# Patient Record
Sex: Male | Born: 1950
Health system: Southern US, Community
[De-identification: ages and names within clinical notes are randomized; demographics above are authoritative.]

## PROBLEM LIST (undated history)

## (undated) DIAGNOSIS — J189 Pneumonia, unspecified organism: Secondary | ICD-10-CM

## (undated) DIAGNOSIS — M1611 Unilateral primary osteoarthritis, right hip: Secondary | ICD-10-CM

## (undated) DIAGNOSIS — Z8639 Personal history of other endocrine, nutritional and metabolic disease: Secondary | ICD-10-CM

## (undated) DIAGNOSIS — M1711 Unilateral primary osteoarthritis, right knee: Secondary | ICD-10-CM

## (undated) DIAGNOSIS — R112 Nausea with vomiting, unspecified: Secondary | ICD-10-CM

## (undated) DIAGNOSIS — I499 Cardiac arrhythmia, unspecified: Secondary | ICD-10-CM

## (undated) DIAGNOSIS — Z951 Presence of aortocoronary bypass graft: Secondary | ICD-10-CM

## (undated) DIAGNOSIS — Z973 Presence of spectacles and contact lenses: Secondary | ICD-10-CM

## (undated) DIAGNOSIS — E785 Hyperlipidemia, unspecified: Secondary | ICD-10-CM

## (undated) DIAGNOSIS — Z9889 Other specified postprocedural states: Secondary | ICD-10-CM

## (undated) DIAGNOSIS — G2581 Restless legs syndrome: Secondary | ICD-10-CM

## (undated) DIAGNOSIS — I255 Ischemic cardiomyopathy: Secondary | ICD-10-CM

## (undated) DIAGNOSIS — Z9289 Personal history of other medical treatment: Secondary | ICD-10-CM

## (undated) DIAGNOSIS — I219 Acute myocardial infarction, unspecified: Secondary | ICD-10-CM

## (undated) DIAGNOSIS — I2510 Atherosclerotic heart disease of native coronary artery without angina pectoris: Secondary | ICD-10-CM

## (undated) DIAGNOSIS — T84020A Dislocation of internal right hip prosthesis, initial encounter: Secondary | ICD-10-CM

## (undated) DIAGNOSIS — U071 COVID-19: Secondary | ICD-10-CM

## (undated) DIAGNOSIS — M00061 Staphylococcal arthritis, right knee: Secondary | ICD-10-CM

## (undated) DIAGNOSIS — K219 Gastro-esophageal reflux disease without esophagitis: Secondary | ICD-10-CM

## (undated) HISTORY — DX: Personal history of other medical treatment: Z92.89

## (undated) HISTORY — PX: ESOPHAGOGASTRODUODENOSCOPY: SHX1529

## (undated) HISTORY — DX: Presence of aortocoronary bypass graft: Z95.1

## (undated) HISTORY — DX: Hyperlipidemia, unspecified: E78.5

## (undated) HISTORY — PX: EYE SURGERY: SHX253

## (undated) HISTORY — PX: CHOLECYSTECTOMY: SHX55

## (undated) HISTORY — DX: Ischemic cardiomyopathy: I25.5

## (undated) HISTORY — PX: COLONOSCOPY: SHX174

## (undated) HISTORY — PX: KNEE SURGERY: SHX244

## (undated) HISTORY — PX: CERVICAL SPINE SURGERY: SHX589

---
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
## 1998-08-09 ENCOUNTER — Ambulatory Visit (HOSPITAL_COMMUNITY): Admission: RE | Admit: 1998-08-09 | Discharge: 1998-08-09 | Payer: Self-pay | Admitting: Neurosurgery

## 1998-08-09 ENCOUNTER — Encounter: Payer: Self-pay | Admitting: Neurosurgery

## 1998-08-28 ENCOUNTER — Encounter: Payer: Self-pay | Admitting: Neurosurgery

## 1998-08-28 ENCOUNTER — Ambulatory Visit (HOSPITAL_COMMUNITY): Admission: RE | Admit: 1998-08-28 | Discharge: 1998-08-28 | Payer: Self-pay | Admitting: Neurosurgery

## 1998-09-18 ENCOUNTER — Ambulatory Visit (HOSPITAL_COMMUNITY): Admission: RE | Admit: 1998-09-18 | Discharge: 1998-09-18 | Payer: Self-pay | Admitting: Neurosurgery

## 1998-12-08 DIAGNOSIS — Z951 Presence of aortocoronary bypass graft: Secondary | ICD-10-CM

## 1998-12-08 HISTORY — DX: Presence of aortocoronary bypass graft: Z95.1

## 1999-05-30 ENCOUNTER — Ambulatory Visit (HOSPITAL_COMMUNITY): Admission: RE | Admit: 1999-05-30 | Discharge: 1999-05-30 | Payer: Self-pay | Admitting: Cardiology

## 1999-05-30 ENCOUNTER — Encounter: Payer: Self-pay | Admitting: Thoracic Surgery (Cardiothoracic Vascular Surgery)

## 1999-05-31 ENCOUNTER — Ambulatory Visit (HOSPITAL_COMMUNITY): Admission: RE | Admit: 1999-05-31 | Discharge: 1999-05-31 | Payer: Self-pay | Admitting: *Deleted

## 1999-06-05 ENCOUNTER — Inpatient Hospital Stay (HOSPITAL_COMMUNITY)
Admission: RE | Admit: 1999-06-05 | Discharge: 1999-06-09 | Payer: Self-pay | Admitting: Thoracic Surgery (Cardiothoracic Vascular Surgery)

## 1999-06-05 HISTORY — PX: CORONARY ARTERY BYPASS GRAFT: SHX141

## 1999-06-06 ENCOUNTER — Encounter: Payer: Self-pay | Admitting: Thoracic Surgery (Cardiothoracic Vascular Surgery)

## 1999-06-07 ENCOUNTER — Encounter: Payer: Self-pay | Admitting: Thoracic Surgery (Cardiothoracic Vascular Surgery)

## 2000-06-23 ENCOUNTER — Encounter: Payer: Self-pay | Admitting: Surgery

## 2000-06-24 ENCOUNTER — Encounter (INDEPENDENT_AMBULATORY_CARE_PROVIDER_SITE_OTHER): Payer: Self-pay

## 2000-06-24 ENCOUNTER — Observation Stay (HOSPITAL_COMMUNITY): Admission: RE | Admit: 2000-06-24 | Discharge: 2000-06-24 | Payer: Self-pay | Admitting: Surgery

## 2000-06-24 ENCOUNTER — Encounter: Payer: Self-pay | Admitting: Surgery

## 2000-06-24 ENCOUNTER — Encounter (INDEPENDENT_AMBULATORY_CARE_PROVIDER_SITE_OTHER): Payer: Self-pay | Admitting: *Deleted

## 2001-04-20 ENCOUNTER — Encounter: Payer: Self-pay | Admitting: Orthopedic Surgery

## 2001-04-21 ENCOUNTER — Encounter: Payer: Self-pay | Admitting: Orthopedic Surgery

## 2001-04-21 ENCOUNTER — Inpatient Hospital Stay (HOSPITAL_COMMUNITY): Admission: RE | Admit: 2001-04-21 | Discharge: 2001-04-22 | Payer: Self-pay | Admitting: Orthopedic Surgery

## 2002-12-08 DIAGNOSIS — M00061 Staphylococcal arthritis, right knee: Secondary | ICD-10-CM

## 2002-12-08 HISTORY — DX: Staphylococcal arthritis, right knee: M00.061

## 2006-04-08 ENCOUNTER — Encounter: Payer: Self-pay | Admitting: Neurosurgery

## 2007-05-28 ENCOUNTER — Encounter: Admission: RE | Admit: 2007-05-28 | Discharge: 2007-05-28 | Payer: Self-pay | Admitting: Cardiology

## 2007-06-01 HISTORY — PX: CARDIAC CATHETERIZATION: SHX172

## 2008-01-16 ENCOUNTER — Encounter: Admission: RE | Admit: 2008-01-16 | Discharge: 2008-01-16 | Payer: Self-pay | Admitting: Orthopedic Surgery

## 2008-01-21 ENCOUNTER — Encounter: Admission: RE | Admit: 2008-01-21 | Discharge: 2008-01-21 | Payer: Self-pay | Admitting: Orthopedic Surgery

## 2008-02-04 ENCOUNTER — Encounter: Admission: RE | Admit: 2008-02-04 | Discharge: 2008-02-04 | Payer: Self-pay | Admitting: Orthopedic Surgery

## 2008-02-06 HISTORY — PX: TRANSTHORACIC ECHOCARDIOGRAM: SHX275

## 2008-02-24 ENCOUNTER — Encounter: Admission: RE | Admit: 2008-02-24 | Discharge: 2008-02-24 | Payer: Self-pay | Admitting: Orthopedic Surgery

## 2008-06-16 ENCOUNTER — Ambulatory Visit (HOSPITAL_COMMUNITY): Admission: RE | Admit: 2008-06-16 | Discharge: 2008-06-17 | Payer: Self-pay | Admitting: Orthopaedic Surgery

## 2008-12-27 ENCOUNTER — Encounter: Payer: Self-pay | Admitting: Gastroenterology

## 2009-01-29 ENCOUNTER — Ambulatory Visit: Payer: Self-pay | Admitting: Gastroenterology

## 2009-02-12 ENCOUNTER — Ambulatory Visit: Payer: Self-pay | Admitting: Gastroenterology

## 2010-01-14 ENCOUNTER — Telehealth: Payer: Self-pay | Admitting: Cardiovascular Disease

## 2010-12-29 ENCOUNTER — Encounter: Payer: Self-pay | Admitting: Orthopedic Surgery

## 2011-01-07 NOTE — Progress Notes (Signed)
Summary: Question for Dr. Mariah Milling  Phone Note Call from Patient Call back at Home Phone [phone]   Caller: Patient Call For: Dr. Mariah Milling Summary of Call: Patient would like for Dr. Mariah Milling to give him a call to discuss and upcoming event that he is planning on participating in if Dr. Mariah Milling thinks that it would be ok.  He is thinking of participating in a wrestling tournament and wants to know if Dr. Mariah Milling thinks this will be ok considering his past heart condition. Initial call taken by: [place],  January 14, 2010 8:07 AM    Called patient and he will think about not wrestling. He hurt himself recently.

## 2011-03-09 DIAGNOSIS — Z9289 Personal history of other medical treatment: Secondary | ICD-10-CM

## 2011-03-09 HISTORY — DX: Personal history of other medical treatment: Z92.89

## 2011-04-22 NOTE — Op Note (Signed)
NAMEMUAAD, BOEHNING                 ACCOUNT NO.:  [id number]   MEDICAL RECORD NO.:  [id number]          PATIENT TYPE:  OIB   LOCATION:  5035                         FACILITY:  [facility]   PHYSICIAN:  Ephriam C. Ophelia Charter, M.D.    DATE OF BIRTH:  1951/03/11   DATE OF PROCEDURE:  06/16/2008  DATE OF DISCHARGE:                               OPERATIVE REPORT   PREOPERATIVE DIAGNOSES:  C5-6, C6-7 spondylosis with herniated nucleus  pulposus.   POSTOPERATIVE DIAGNOSES:  C5-6, C6-7 spondylosis with herniated nucleus  pulposus.   PROCEDURES:  C5-6, C6-7 anterior cervical diskectomy and fusion,  allograft and plate.   SURGEON:  Trung C. Ophelia Charter, MD   ASSISTANT:  Maud Deed, PA-C   ANESTHESIA:  GOT.   DRAINS:  One Hemovac.   ESTIMATED BLOOD LOSS:  100 mL.   PROCEDURE:  After induction of general anesthesia and orotracheal  intubation, the patient had his arms tucked to the sides.  Preoperative  antibiotics were given.  A surgical checklist time-out was performed  after the area was squared with towels, sterile skin marker, and a  prominent skin crease overlying the appropriate level.  Betadine bio  drape, sterile Mayo stand to the head, and thyroid sheets and drapes.  Incision was made starting at the midline extending to the left.  Platysma was divided in the line with the fibers.  Blunt dissection to  the patient's very short thick neck down to the longus coli was  performed.  This was extremely deep hole.  Large Bishop and Cloward  retractors were initially placed with the longus plates proximal and one  could barely hold the longus coli.  It had a tendency to pop up.  We can  see the esophagus.  Shadow line retractor was then placed and then using  combination of shad own line right and left and then Cloward retractor  up and down, this gave visualization.  Diskectomy was performed at C5-6  after needle was placed at 4-5 and confirmed with the lateral C-arm that  was at appropriate level.   Spurs were removed with power bur progressing  back to the posterior cortex.  There was thick chunks of disk and the  ligament which was causing deformity of the cord as well as spurring and  the posterior aspect showed a 1-mm disk space.  Spurs and disk were  removed, so the dura was completely decompressed, was visualized on both  gutters.  There was extruded fragment and there was significant  irritation with the arm jumping on the left side.  No fragments were  present slightly superior into the left.  Spurs were removed out.  Lateral gutters were stripped and sizers with rasp were used and 7-mm  graft was selected, impacted this flush with the bone slightly sticking  out about a millimeter.  The patient had some jumping of the lower  extremities and the graft was not advanced further until the other level  was performed.  Self-retaining retractors were moved down the C6-7  level.  Diskectomy was performed.  There was a 1-mm disk space  that was  tighter than looked on MRI scan and had to be progressed down in the bur  at the maximum setting would barely reach the posterior cortex.  Extra  long 2-mm Kerrison was needed to remove chunks of ligament and disk off  of the dura.  After irrigation with saline solution, stripping the  gutters, a 6-mm graft was selected, allograft cortical cancellus.  Before the C6-7 graft was placed, C5-6 graft was advanced with traction  applied by the CRNA until it was flushed to the anterior cortex.  It was  secured then the lower graft was placed, countersunk 1-2 mm, and was  secured.  A 6-mm plate was selected, initially placed, and on the AP  fluoroscopy was slightly angled, it was repositioned appropriately, so  it was at the midline and then all the 6 holes of screw were filled with  the Biomet view-lock plate and 16-XW screws.  All screws locked in with  the ring.  After irrigation, some FloSeal was squirted.  Fingertip was  placed down in the  anterior to the vertebral body of the mediastinum for  egress of any fluid.  The operative field was dry.  Esophagus was  intact.  Hemovac was placed in-and-out technique in line with skin  incision, 3-0 Vicryl, 4-0 Vicryl was supplied, and tincture of Benzoin,  Steri-Strips, Marcaine infiltration, and a postop dressing, and collar.  The patient tolerated the procedure well, was neurologically intact in  the recovery room.      Inez C. Ophelia Charter, M.D.  Electronically Signed     MCY/MEDQ  D:  06/16/2008  T:  06/16/2008  Job:  [id number]

## 2011-04-25 NOTE — Op Note (Signed)
[facility]  Patient:    Lee Mclaughlin, Lee Mclaughlin                        MRN: [id number] Proc. Date: 04/20/01 Adm. Date:  [id number] Attending:  Twana First                           Operative Report  PREOPERATIVE DIAGNOSIS: Right septic knee infection.  POSTOPERATIVE DIAGNOSIS: Right septic knee infection.  OPERATION/PROCEDURE: Right knee examination under anesthesia followed by arthroscopic lavage and partial subtotal synovectomy.  SURGEON: Elana Alm. Thurston Hole, M.D.  ASSISTANT: Kirstin A. Shepperson, P.A.  ANESTHESIA: General.  OPERATIVE TIME: Forty minutes.  COMPLICATIONS: None.  INDICATIONS FOR PROCEDURE: Mr. Renner is a 60 year old gentleman who had previously undergone a right knee arthroscopy four days ago.  He has had persistently increasing pain over the last 24 hours, with knee aspiration today showing gram-positive cocci with obvious staph infection, and is to undergo arthroscopy with lavage and IV antibiotic treatment.  DESCRIPTION OF PROCEDURE: Mr. Empson was brought to the operating room on Apr 20, 2001 and placed on the operating room table in the supine position.  After an adequate level of general anesthesia was obtained his right knee was examined under anesthesia.  Range of motion was -5 to 120 degrees, with obvious purulent material draining from the lateral portal, with mild erythema around this lateral portal.  The medial and lateral portals were otherwise intact.  No other cellulitis noted.  His right leg was prepped using sterile Betadine and draped using sterile technique.  He received Ancef 1 g IV. Initially through the inferolateral portal the arthroscope with a pump attached was placed and purulent drainage removed.  He had already had cultures obtained so new cultures were not needed.  Through the inferomedial portal an arthroscopic shaver was placed.  He was found to have moderate hemorrhage and old blood in the  knee from his previous arthroscopy.  This was thoroughly debrided.  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  The medial meniscus remnant was intact.  The intercondylar notch was inspected and the anterior and posterior cruciate ligaments were intact, but there was granulation tissue and old blood in and around the anterior notch, which was debrided and purulent material as well. The lateral compartment showed grade 3 chondromalacia, 25-30%, similar to what was found on his previous arthroscopy, and lateral meniscal remnant was found to be intact as well.  The patellofemoral joint showed grade 3 chondromalacia over 50-75% and, again, this was very similar to what was found on his previous arthroscopy, with no new findings noted.  Further debridement was carried out.  Significant synovitis in medial and lateral gutters was thoroughly debrided as well as the suprapatellar pouch.  No loose bodies were noted.  After this had been thoroughly cleansed and debrided and 9000 cc of saline solution and 500 cc of antibiotic solution had been thoroughly irrigated through the knee, and it was felt that all pathology had been satisfactorily addressed, the instruments were removed.  Through the anteromedial and anterolateral portals two medium Hemovac drains were placed. Sterile dressings were then applied and the patient was awakened and taken to the recovery room in stable condition.  Needle  and sponge counts were correct x 2 at the end of the case. DD:  04/20/01 TD:  04/21/01 Job: [id number]

## 2011-04-25 NOTE — Op Note (Signed)
[facility]  Patient:    Lee Mclaughlin, Lee Mclaughlin                        MRN: [id number] Proc. Date: 06/24/00 Adm. Date:  [id number] Disc. Date: [id number] Attending:  Charlton Haws CC:         Lum Babe, M.D.                           Operative Report  ACCOUNT NUMBER:  [id number]  CCS NUMBER:  [id number]  PREOPERATIVE DIAGNOSIS:  Chronic calculus cholecystitis.  POSTOPERATIVE DIAGNOSIS:  Chronic calculus cholecystitis.  OPERATION:  Laparoscopic cholecystectomy with operative cholangiogram.  SURGEON:  Currie Paris, M.D.  ASSISTANT:  Donnie Coffin. Samuella Cota, M.D.  ANESTHESIA:  General endotracheal.  CLINICAL HISTORY:  The patient is a 60 year old who had a prior history of coronary artery disease who presented with some acute abdominal pain, and was found to have gallstones.  His pain resolved and he was scheduled for elective cholecystectomy.  He had a slightly elevated total bilirubin, but the remaining liver functions were normal.  DESCRIPTION OF PROCEDURE:  The patient was brought to the operating room and after satisfactory general endotracheal anesthesia had been obtained, the abdomen was prepped and draped.  A 0.25% Marcaine at each incision.  An umbilical incision was made, and the fascia opened, and the peritoneal cavity entered.  A pursestring was placed and the [place] introduced.  The abdomen was insufflated to 15.  The patient was placed in reversed Trendelenburg, tilted to the left, and three additional cannulas were placed in their usual position.  The gallbladder was grasped and retracted over the liver.  The peritoneum elevated and the cystic duct opened, and the cystic duct identified.  It was fairly small.  I could see the anterior branch of the cystic duct.  I opened the peritoneum so I had a good look in the window over triangle of Calot.  A clip was placed on the cystic duct _______ of the gallbladder.  The cystic duct was  opened.  Some bile was milked out of the duct.  A 14 angiocath was used to thread a catheter into the cystic duct and it was held in place with a clip.  Operative cholangiography was done because of the slightly elevated total bilirubin.  The cholangiogram appeared normal.  The catheter was removed and two clips placed on the cystic duct on the stay side, and the duct was divided.  The anterior branch of the artery was triple clipped and divided, leaving two on the stay side.  _______ dissection revealed the posterior branch of the artery which was clipped and divided in a similar fashion.  The gallbladder was removed from below to above with coagulation ______ with the cautery.  Once we had disconnected, we irrigated and cauterized the bed of the gallbladder to make sure everything was dry.  When we thought everything was okay, the gallbladder was brought out the umbilical port.  The abdomen was reinsufflated and final irrigation, suctioning, and check for hemostasis was made, and again everything appeared okay.  The lateral ports were removed and there was no bleeding internally from these.  There was a small skin bleeder at the most lateral port which was cauterized and controlled.  The umbilical port was removed and the suture tied down.  This was checked with camera and appeared okay.  The abdomen was deflated through  the epigastric port.  The skin was closed with 4-0 Monocryl subcuticular plus Steri-Strips.  The patient tolerated the procedure well.  There were no operative complications.  All counts were correct. DD:  06/24/00 TD:  06/25/00 Job: [id number]

## 2011-09-04 LAB — COMPREHENSIVE METABOLIC PANEL
Albumin: 4.2
BUN: 18
CO2: 28
Calcium: 10.1
Chloride: 104
Creatinine, Ser: 1.12
GFR calc non Af Amer: >60
Total Bilirubin: 0.9

## 2011-09-04 LAB — CBC
HCT: 44.3
MCHC: 34.4
MCV: 91.2
Platelets: 177
WBC: 8.1

## 2011-09-04 LAB — DIFFERENTIAL
Basophils Absolute: 0
Lymphocytes Relative: 35
Neutro Abs: 4.1

## 2011-09-04 LAB — URINALYSIS, ROUTINE W REFLEX MICROSCOPIC
Ketones, ur: NEGATIVE
Nitrite: NEGATIVE
Protein, ur: NEGATIVE
Urobilinogen, UA: 0.2

## 2011-09-04 LAB — PROTIME-INR: Prothrombin Time: 12.6

## 2011-09-04 LAB — APTT: aPTT: 25

## 2012-07-10 ENCOUNTER — Emergency Department (HOSPITAL_COMMUNITY)
Admission: EM | Admit: 2012-07-10 | Discharge: 2012-07-10 | Disposition: A | Discharge status: Home / Self Care | Payer: 59 | Attending: Emergency Medicine | Admitting: Emergency Medicine

## 2012-07-10 ENCOUNTER — Encounter (HOSPITAL_COMMUNITY): Payer: Self-pay | Admitting: Emergency Medicine

## 2012-07-10 DIAGNOSIS — Z23 Encounter for immunization: Secondary | ICD-10-CM | POA: Insufficient documentation

## 2012-07-10 DIAGNOSIS — S51809A Unspecified open wound of unspecified forearm, initial encounter: Secondary | ICD-10-CM | POA: Insufficient documentation

## 2012-07-10 DIAGNOSIS — IMO0002 Reserved for concepts with insufficient information to code with codable children: Secondary | ICD-10-CM

## 2012-07-10 DIAGNOSIS — I2510 Atherosclerotic heart disease of native coronary artery without angina pectoris: Secondary | ICD-10-CM | POA: Insufficient documentation

## 2012-07-10 DIAGNOSIS — W268XXA Contact with other sharp object(s), not elsewhere classified, initial encounter: Secondary | ICD-10-CM | POA: Insufficient documentation

## 2012-07-10 DIAGNOSIS — Z951 Presence of aortocoronary bypass graft: Secondary | ICD-10-CM | POA: Insufficient documentation

## 2012-07-10 HISTORY — DX: Atherosclerotic heart disease of native coronary artery without angina pectoris: I25.10

## 2012-07-10 MED ORDER — TETANUS-DIPHTH-ACELL PERTUSSIS 5-2.5-18.5 LF-MCG/0.5 IM SUSP
0.5000 mL | Freq: Once | INTRAMUSCULAR | Status: AC
  Administered 2012-07-10: 0.5 mL via INTRAMUSCULAR
  Filled 2012-07-10: qty 0.5

## 2012-07-10 MED ORDER — CIPROFLOXACIN HCL 500 MG PO TABS: 500.0000 mg | ORAL_TABLET | Freq: Two times a day (BID) | ORAL | Status: AC

## 2012-07-10 NOTE — ED Notes (Addendum)
Slipped on fiberglass boat, cut right arm on edge of fiberglass. Laceration to right forearm, approx 2" long bleeding controlled. [place] Has full ROM of all fingers

## 2012-07-10 NOTE — ED Provider Notes (Signed)
History     CSN: [id number]  Arrival date & time 07/10/12  1810   First MD Initiated Contact with Patient 07/10/12 1946      Chief Complaint  Patient presents with  . Extremity Laceration    (Consider location/radiation/quality/duration/timing/severity/associated sxs/prior treatment) HPI  61 year old male in no acute distress complaining of laceration to right forearm several hours ago. [place] he [place] and cut the arm on a piece of sharp Fiberglas. Patient's tetanus status unknown. After the laceration patient [place]. Bleeding and pain controlled.   Past Medical History  Diagnosis Date  . Coronary artery disease     Past Surgical History  Procedure Date  . Coronary artery bypass graft   . Cervical spine surgery     No family history on file.  History  Substance Use Topics  . Smoking status: Current Some Day Smoker    Types: Cigars  . Smokeless tobacco: Not on file  . Alcohol Use: Yes      Review of Systems  Skin: Positive for wound.  All other systems reviewed and are negative.    Allergies  Review of patient's allergies indicates no known allergies.  Home Medications   Current Outpatient Rx  Name Route Sig Dispense Refill  . ASPIRIN 81 MG PO TABS Oral Take 81 mg by mouth daily.    Marland Kitchen CARVEDILOL 12.5 MG PO TABS Oral Take 12.5 mg by mouth 2 (two) times daily with a meal.    . EZETIMIBE 10 MG PO TABS Oral Take 10 mg by mouth daily.    . OMEGA-3 FATTY ACIDS 1000 MG PO CAPS Oral Take 2 g by mouth daily.    Marland Kitchen LOSARTAN POTASSIUM 100 MG PO TABS Oral Take 100 mg by mouth daily.    Marland Kitchen ROPINIROLE HCL 1 MG PO TABS Oral Take 1 mg by mouth 3 (three) times daily.    Marland Kitchen CIPROFLOXACIN HCL 500 MG PO TABS Oral Take 1 tablet (500 mg total) by mouth 2 (two) times daily. 20 tablet 0    BP 129/79  Pulse 78  Temp 98.2 F (36.8 C) (Oral)  Resp 18  SpO2 99%  Physical Exam  Vitals reviewed. Constitutional: He is oriented  to person, place, and time. He appears well-developed and well-nourished. No distress.  HENT:  Head: Normocephalic.  Eyes: Conjunctivae and EOM are normal.  Cardiovascular: Normal rate.   Pulmonary/Chest: Effort normal.  Musculoskeletal: Normal range of motion.  Neurological: He is alert and oriented to person, place, and time.  Skin:       10 cm non-jagged full-thickness laceration to proximal right forearm just distal to the olecranon. Wound is clean, bleeding is controlled. No penetration into joint capsule.   Psychiatric: He has a normal mood and affect.    ED Course  Procedures (including critical care time)  LACERATION REPAIR Performed by: Wynetta Emery Authorized by: Wynetta Emery Consent: Verbal consent obtained. Risks and benefits: risks, benefits and alternatives were discussed Consent given by: patient Patient identity confirmed: provided demographic data Prepped and Draped in normal sterile fashion Wound explored  Laceration Location: Right proximal forearm  Laceration Length: 10cm  No Foreign Bodies seen or palpated  Anesthesia: local infiltration  Local anesthetic: lidocaine 2 %  Without epinephrine  Anesthetic total: 6 ml  Irrigation method: syringe Amount of cleaning: standard  Skin closure: 3-0 polypropylene   Number of sutures: 7  Technique: Simple interrupted   Patient tolerance:  Patient tolerated the procedure well with no immediate complications.  Labs Reviewed - No data to display No results found.   1. Laceration       MDM  10 cm laceration to right proximal forearm closed with simple interrupted sutures. Patient tetanus updated. Because patient exposed open laceration to fresh water Cipro is indicated. Wound care and suture removal instructions given.  Pt verbalized understanding and agrees with care plan. Outpatient follow-up and return precautions given.           Joni Reining Shaletha Humble, PA-C 07/11/12 0005

## 2012-07-11 NOTE — ED Provider Notes (Signed)
Medical screening examination/treatment/procedure(s) were performed by non-physician practitioner and as supervising physician I was immediately available for consultation/collaboration.   Lyanne Co, MD 07/11/12 [phone]

## 2012-10-19 DIAGNOSIS — Z9289 Personal history of other medical treatment: Secondary | ICD-10-CM

## 2012-10-19 HISTORY — DX: Personal history of other medical treatment: Z92.89

## 2013-08-09 ENCOUNTER — Telehealth: Payer: Self-pay | Admitting: Internal Medicine

## 2013-08-09 NOTE — Telephone Encounter (Signed)
Patient would like to discontinue the Zetia. He has changed insurance companies and it will cost him $360.00/month. He states that Dr. Rennis Golden is aware of his lipid condition. He will be going to his "doctor" on Tuesday and get his blood drawn to see what his cholesterol "numbers" are. He wants Dr. Rennis Golden to know this and get his opinion. Message will be given to Dr. Rennis Golden for review.

## 2013-08-09 NOTE — Telephone Encounter (Signed)
Pt has some concerned with his medication. He would like to come off of the Zetia.

## 2013-08-09 NOTE — Telephone Encounter (Signed)
Dr. Rennis Golden this is for your review. Patient is aware that you will not be in the office before Thursday.

## 2013-08-11 NOTE — Telephone Encounter (Signed)
Left him a message - he may benefit from a copay assistance card.  -Dr. Rennis Golden

## 2013-08-23 ENCOUNTER — Encounter: Payer: Self-pay | Admitting: Internal Medicine

## 2013-11-20 ENCOUNTER — Encounter: Payer: Self-pay | Admitting: *Deleted

## 2013-11-22 ENCOUNTER — Ambulatory Visit (INDEPENDENT_AMBULATORY_CARE_PROVIDER_SITE_OTHER): Payer: BC Managed Care – PPO | Admitting: Internal Medicine

## 2013-11-22 ENCOUNTER — Encounter: Payer: Self-pay | Admitting: Internal Medicine

## 2013-11-22 VITALS — BP 112/72 | Ht 68.5 in | Wt 223.8 lb

## 2013-11-22 DIAGNOSIS — I255 Ischemic cardiomyopathy: Secondary | ICD-10-CM

## 2013-11-22 DIAGNOSIS — I2589 Other forms of chronic ischemic heart disease: Secondary | ICD-10-CM

## 2013-11-22 DIAGNOSIS — Z1389 Encounter for screening for other disorder: Secondary | ICD-10-CM

## 2013-11-22 DIAGNOSIS — Z789 Other specified health status: Secondary | ICD-10-CM

## 2013-11-22 DIAGNOSIS — I2510 Atherosclerotic heart disease of native coronary artery without angina pectoris: Secondary | ICD-10-CM | POA: Insufficient documentation

## 2013-11-22 DIAGNOSIS — E785 Hyperlipidemia, unspecified: Secondary | ICD-10-CM | POA: Insufficient documentation

## 2013-11-22 DIAGNOSIS — Z951 Presence of aortocoronary bypass graft: Secondary | ICD-10-CM | POA: Insufficient documentation

## 2013-11-22 NOTE — Patient Instructions (Addendum)
Your physician recommends that you return for lab work in a few days to a week.  You will need to be fasting.   Your physician wants you to follow-up in: 1 year with Dr. Rennis Golden. You will receive a reminder letter in the mail two months in advance. If you don't receive a letter, please call our office to schedule the follow-up appointment.

## 2013-11-22 NOTE — Progress Notes (Signed)
OFFICE NOTE  Chief Complaint:  No complaints, new job, no longer going to [employer]  Primary Care Physician: No primary provider on file.  HPI:  Lee Mclaughlin  is a 62 year old gentleman with a history of coronary disease, status post CABG x 4 in 2000 (LIMA to LAD, SVG to T1, SVG to PDA (left dominant), free radial graft to OM1. His heart cath in 2008 showed no obstructive coronary artery disease after his nuclear stress test was negative. EF was 40%; however, came back to normal in 2012. He is actually incredibly active at this point. 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 He does high impact activity. Recentl was involved with the [employer] program and had been going out to [facility] to receive treatment, until recently when he had a disagreement with his boss and ultimately lost his job and these treatments. This consisted of anti-aging therapy of vitamins, supplements, testosterone injections, etc. I reviewed laboratory work that was performed from that program, which indicates low hemoglobin A1c of 5.3. His CBC was within normal limits. Testosterone tests were at the high end of normal with low fasting serum insulin. His PSA was low. T3, TSH, and T4 were within normal limits. His lipid profile showed total cholesterol of 153, triglycerides 94, HDL 49, LDL 85, which is a good profile. He does have a history of intolerance to statins and has been maintained on fenofibrate and Zetia, and for not being on a statin, actually that LDL cholesterol is pretty low.   He recently discontinued his statin medication and reports marked improvement in chronic pain. He's also stopped high level activity and exercise and has cut back significantly on testosterone injections. He reports that he wishes now to get back to doing some exercise after he returns from a visit out of the country. He is curious to see what his cholesterol looks like today as it's been about 6 months off of  medication.  Finally he is reporting some tenderness and tingling with occasional swelling and a lump that develops under the left lateral malleolus. There is some associated numbness with this.  PMHx:  Past Medical History  Diagnosis Date  . Coronary artery disease   . S/P CABG (coronary artery bypass graft) 2000  . H/O cardiovascular stress test 10/19/2012    MET test - good effort, peak VO2 of almost 112% predicted, HR 79% predicted, mildly ischemia at end of exercise  . Ischemic cardiomyopathy     history of   . Dyslipidemia   . History of nuclear stress test 03/2011    bruce myoview; mild perfusion defect in mid anteroseptal, apical septal, apical regions (infarct/scar/breast attenuation); post-stress EF 55%; no signficant ischemia    Past Surgical History  Procedure Laterality Date  . Coronary artery bypass graft  06/05/1999    LIMA to LAD, SVG to diagonal 1, SVG to PDA of Cfx, right radial graft to OM1 (Dr. Molinda Bailiff)   . Cervical spine surgery    . Cardiac catheterization  06/01/2007    no obstructive CAD, patent grafts, EF 40%, apical hypokinesis, anterolateral hypokinesis (Dr. Laurell Josephs)   . Knee surgery Right   . Transthoracic echocardiogram  02/2008    EF 40%, severe apical wall hypokinesis, mod-severe anterior wall hypokinesis; RV mildly dilated; mild mitral annular calcif; mild TR, RSVP 30-53mmHg    FAMHx:  Family History  Problem Relation Age of Onset  . CAD Father     s/p  CABGx4 at 74  . Hyperlipidemia Father     SOCHx:   reports that he has been smoking Cigars.  He does not have any smokeless tobacco history on file. He reports that he drinks alcohol. His drug history is not on file.  ALLERGIES:  Allergies  Allergen Reactions  . Crestor [Rosuvastatin]     Myalgias   . Vytorin [Ezetimibe-Simvastatin]     Myalgias     ROS: A comprehensive review of systems was negative except for: Musculoskeletal: positive for left foot pain  HOME MEDS: Current  Outpatient Prescriptions  Medication Sig Dispense Refill  . aspirin 81 MG tablet Take 81 mg by mouth daily.      . carvedilol (COREG) 12.5 MG tablet Take 12.5 mg by mouth 2 (two) times daily with a meal.      . fenofibrate micronized (LOFIBRA) 134 MG capsule Take 134 mg by mouth daily before breakfast.      . fish oil-omega-3 fatty acids 1000 MG capsule Take 2 g by mouth daily.      Marland Kitchen losartan (COZAAR) 100 MG tablet Take 100 mg by mouth daily.      Marland Kitchen rOPINIRole (REQUIP) 1 MG tablet Take 1 mg by mouth 4 (four) times daily.       . TESTOSTERONE IM Inject into the muscle. 0.2 mL every 10 days      . zolpidem (AMBIEN) 10 MG tablet Take 10 mg by mouth at bedtime as needed for sleep.      . Cyanocobalamin (VITAMIN B-12 IJ) Inject as directed.       No current facility-administered medications for this visit.    LABS/IMAGING: No results found for this or any previous visit (from the past 48 hour(s)). No results found.  VITALS: BP 112/72  Ht 5' 8.5" (1.74 m)  Wt 223 lb 12.8 oz (101.515 kg)  BMI 33.53 kg/m2  EXAM: General appearance: alert and no distress Neck: no carotid bruit and no JVD Lungs: clear to auscultation bilaterally Heart: regular rate and rhythm, S1, S2 normal, no murmur, click, rub or gallop Abdomen: soft, non-tender; bowel sounds normal; no masses,  no organomegaly Extremities: extremities normal, atraumatic, no cyanosis or edema and mild tenderness over the left lateral malleolus Pulses: 2+ and symmetric Skin: Skin color, texture, turgor normal. No rashes or lesions Neurologic: Grossly normal Psych: Mood, affect normal  EKG: Sinus bradycardia with first degree AV block, PR interval 220 msec  ASSESSMENT: 1. Coronary artery disease status post four-vessel CABG in 2000 2. Dyslipidemia-intolerant to statins 3. Possible gout 4. History of ischemic cardiomyopathy, EF 40% now improved to 55%  PLAN: 1.   Lee Mclaughlin is doing well, and I'm pleased that he is discontinued  discogenic program. This mainly stems from the fact that he felt that they were driving him to harden giving him high doses of steroids, which actually is a hallmark of this program and antiaging therapy in general. I have continually stressed to him that with his coronary artery disease this is not beneficial for him and he is now reached the same conclusion. I would like to recheck his lipid profile due to his intolerance to statins and the fact that he's been off of medication now for 6 months. Finally he is describing some tenderness, numbness and/or discomfort around the left ankle joint that comes and goes. I wonder if this could be related to gout. We'll go ahead and check a uric acids level today for screening. Plan to see him back  annually.  Chrystie Nose, MD, [facility] Attending Cardiologist [facility]  Iris Hairston C 11/22/2013, 9:38 AM

## 2013-11-23 LAB — NMR LIPOPROFILE WITH LIPIDS
Cholesterol, Total: 215 mg/dL — ABNORMAL HIGH (ref <200)
HDL Particle Number: 35.5 umol/L (ref >=30.5)
HDL-C: 55 mg/dL (ref >=40)
LDL (calc): 134 mg/dL — ABNORMAL HIGH (ref <100)
LDL Particle Number: 2114 nmol/L — ABNORMAL HIGH (ref <1000)
LDL Size: 21.1 nm (ref >20.5)
LP-IR Score: 74 — ABNORMAL HIGH (ref <=45)
Large HDL-P: 1.5 umol/L — ABNORMAL LOW (ref >=4.8)
VLDL Size: 48.6 nm — ABNORMAL HIGH (ref <=46.6)

## 2013-11-23 LAB — URIC ACID: Uric Acid, Serum: 4.4 mg/dL (ref 4.0–7.8)

## 2013-11-23 NOTE — Progress Notes (Signed)
LMTCB

## 2013-11-24 ENCOUNTER — Telehealth: Payer: Self-pay | Admitting: Internal Medicine

## 2013-11-24 ENCOUNTER — Telehealth: Payer: Self-pay | Admitting: *Deleted

## 2013-11-24 ENCOUNTER — Encounter: Payer: Self-pay | Admitting: *Deleted

## 2013-11-24 DIAGNOSIS — E785 Hyperlipidemia, unspecified: Secondary | ICD-10-CM

## 2013-11-24 MED ORDER — PITAVASTATIN CALCIUM 4 MG PO TABS: 4.0000 mg | ORAL_TABLET | Freq: Every day | ORAL | Status: DC

## 2013-11-24 NOTE — Telephone Encounter (Signed)
Message forwarded to J. Elkins, RN.  

## 2013-11-24 NOTE — Telephone Encounter (Signed)
Called patient with lab results. Instructed to take livalo 4mg  daily per Dr. Rennis Golden and re-check labs in 3 months. Patient agreed with plan & verbalized understanding. Medication ordered and copy of labs/lab slip for repeat NMR mailed to patient

## 2013-11-24 NOTE — Telephone Encounter (Signed)
Message copied by Lindell Spar on Thu Nov 24, 2013  8:55 AM ------      Message from: Chrystie Nose      Created: Wed Nov 23, 2013  2:38 PM       Please notify him the cholesterol is too high. He really should consider going back on cholesterol medication. He has failed crestor and Vytorin.  Would he consider atorvastatin (Lipitor) 40 mg daily? IF he has failed this in the past, then he could take Livalo 4 mg daily.            Also, his uric acid level is low - so I doubt he has gout.            -Dr. Rennis Golden ------

## 2013-11-24 NOTE — Telephone Encounter (Signed)
Returning jenna's call regarding lab results  Please call

## 2013-11-24 NOTE — Telephone Encounter (Signed)
Notified of lab results. 

## 2013-11-29 ENCOUNTER — Telehealth: Payer: Self-pay | Admitting: Cardiology

## 2013-11-29 NOTE — Telephone Encounter (Signed)
Returned patient's call. Informed that livalo is to undergo prior authorization process and offered samples. Patient stated will be leaving out of the country today, and won't be back until early January. Informed patient to call office if when he returns, that pharmacy has not notified him that the medication is ready and samples can be given at that time.

## 2013-11-29 NOTE — Telephone Encounter (Signed)
Returning your call. °

## 2013-12-07 ENCOUNTER — Telehealth: Payer: Self-pay | Admitting: *Deleted

## 2013-12-07 NOTE — Telephone Encounter (Signed)
Faxed prior authorization for Pitavastatin Calcium 4 MG TABS

## 2013-12-13 ENCOUNTER — Telehealth: Payer: Self-pay | Admitting: *Deleted

## 2013-12-13 NOTE — Telephone Encounter (Signed)
Called [employer] to check on status of PA. Approved through life of policy.  Called pharmacy to verify status.. livalo went through - will cost ~$399/90 day supply

## 2013-12-22 ENCOUNTER — Telehealth: Payer: Self-pay | Admitting: Internal Medicine

## 2013-12-22 NOTE — Telephone Encounter (Signed)
New medication is too expensive Can you call in something cheaper  Please call

## 2013-12-22 NOTE — Telephone Encounter (Signed)
Please make sure this is the right chart.  No documentation in this chart or appointments.  Thanks.

## 2014-02-03 ENCOUNTER — Telehealth: Payer: Self-pay | Admitting: Internal Medicine

## 2014-02-03 NOTE — Telephone Encounter (Signed)
Returned call to patient. He informed that he has been trying to get in touch with our office regarding cost of livalo prescribed by [facility].. Informed patient that these calls never made it to this RN. Patient voiced understanding. States that when he went to phartmacy to pick of Rx that his copay was $100/month and he was not willing to pay this cost. He was wondering if there is another statin medication Dr. Rennis GoldenHilty can prescribe. RN offered to try to complete a tier exception request for this medication, before seeing about switching medication, as he ahs tried most statin's in the past.   [phone] option 3 then option 1   RN in process of requesting tier exception

## 2014-02-03 NOTE — Telephone Encounter (Signed)
Please call-pt said he have been calling since November about getting another statin. He can not afford the one he was prescribed.Please call today.

## 2014-02-06 NOTE — Telephone Encounter (Signed)
livalo 4mg  tablets approved thru [employer] [place]

## 2014-02-06 NOTE — Telephone Encounter (Signed)
Ok .. Let me know if I can help with the prior authorization.  Dr. HRexene Edison

## 2014-02-06 NOTE — Telephone Encounter (Signed)
Called pharmacy to inquire of cost of livalo with tier exception approval from [employer] - $70/month.  Informed patient of this information, of which he states he does not wish to take this medication due to the cost.  Informed Dr. Rennis GoldenHilty - no other statin options available, as patient has tried vytorin, simvastatin, pravastatin, rosuvastatin, atorvastatin, along with zetia - all with SE of myalgias, ineffective at managing cholesterol, or liver damage. Patient is not willing to re-try statin, r/t myalgias. Will inform patient to monitor diet/lifestyle in order to attempt to control cholesterol.

## 2014-02-24 ENCOUNTER — Other Ambulatory Visit: Payer: Self-pay | Admitting: *Deleted

## 2014-02-24 ENCOUNTER — Telehealth: Payer: Self-pay | Admitting: Internal Medicine

## 2014-02-24 DIAGNOSIS — E785 Hyperlipidemia, unspecified: Secondary | ICD-10-CM

## 2014-02-24 NOTE — Telephone Encounter (Signed)
Patient would like to have lipids re-checked in a few months, after diet/exercise.Marland Kitchen. He does not want to pay for livalo.Rip [place]bour. Ok to order NMR?

## 2014-02-24 NOTE — Telephone Encounter (Signed)
Is calling about medication (not sure what the medication is ) but it has a 235.00 copay . Stating that he has not heard back from the nurse and has not heard anything .Marland Kitchen. Please call so he can know what to do about the medication ..Marland Kitchen

## 2014-02-24 NOTE — Telephone Encounter (Signed)
Ok to recheck in 3 months. 

## 2014-02-24 NOTE — Telephone Encounter (Signed)
Returned call to patient. Notified patient again that insurance will not lower cost of livalo. Patient will try diet/exercise.

## 2014-08-11 ENCOUNTER — Encounter: Payer: Self-pay | Admitting: Gastroenterology

## 2014-12-12 ENCOUNTER — Other Ambulatory Visit: Payer: Self-pay | Admitting: *Deleted

## 2014-12-12 ENCOUNTER — Telehealth: Payer: Self-pay | Admitting: Internal Medicine

## 2014-12-12 NOTE — Telephone Encounter (Signed)
Spoke w/ pt, no orders pending. Pt will be here in AM Thursday, will fast coming into appt in anticipation of any bloodwork to be ordered, [facility]. Pt agreeable & voiced understanding.

## 2014-12-12 NOTE — Telephone Encounter (Signed)
Pt called in wanting to know if he is going to need to have labs done when he comes in for his appt with Dr. Rennis GoldenHilty on 1/7. Please call him to let him know  Thanks

## 2014-12-14 ENCOUNTER — Encounter: Payer: Self-pay | Admitting: Internal Medicine

## 2014-12-14 ENCOUNTER — Ambulatory Visit (INDEPENDENT_AMBULATORY_CARE_PROVIDER_SITE_OTHER): Payer: Commercial Managed Care - PPO | Admitting: Internal Medicine

## 2014-12-14 VITALS — BP 110/76 | HR 68 | Ht 69.0 in | Wt 240.4 lb

## 2014-12-14 DIAGNOSIS — Z789 Other specified health status: Secondary | ICD-10-CM

## 2014-12-14 DIAGNOSIS — Z79899 Other long term (current) drug therapy: Secondary | ICD-10-CM

## 2014-12-14 DIAGNOSIS — Z889 Allergy status to unspecified drugs, medicaments and biological substances status: Secondary | ICD-10-CM

## 2014-12-14 DIAGNOSIS — E785 Hyperlipidemia, unspecified: Secondary | ICD-10-CM

## 2014-12-14 DIAGNOSIS — I2583 Coronary atherosclerosis due to lipid rich plaque: Secondary | ICD-10-CM

## 2014-12-14 DIAGNOSIS — Z951 Presence of aortocoronary bypass graft: Secondary | ICD-10-CM

## 2014-12-14 DIAGNOSIS — I255 Ischemic cardiomyopathy: Secondary | ICD-10-CM

## 2014-12-14 DIAGNOSIS — I2510 Atherosclerotic heart disease of native coronary artery without angina pectoris: Secondary | ICD-10-CM

## 2014-12-14 LAB — COMPREHENSIVE METABOLIC PANEL
ALBUMIN: 4.7 g/dL (ref 3.5–5.2)
ALT: 18 U/L (ref 0–53)
AST: 20 U/L (ref 0–37)
Alkaline Phosphatase: 27 U/L — ABNORMAL LOW (ref 39–117)
BILIRUBIN TOTAL: 0.8 mg/dL (ref 0.2–1.2)
BUN: 36 mg/dL — AB (ref 6–23)
CALCIUM: 10.3 mg/dL (ref 8.4–10.5)
CO2: 26 mEq/L (ref 19–32)
Chloride: 102 mEq/L (ref 96–112)
Creat: 1.35 mg/dL (ref 0.50–1.35)
GLUCOSE: 97 mg/dL (ref 70–99)
Potassium: 4.8 mEq/L (ref 3.5–5.3)
SODIUM: 137 meq/L (ref 135–145)
TOTAL PROTEIN: 7.4 g/dL (ref 6.0–8.3)

## 2014-12-14 NOTE — Progress Notes (Signed)
OFFICE NOTE  Chief Complaint:  Abdominal bloating  Primary Care Physician: Tula Nakayama  HPI:  Lee Mclaughlin  is a 64 year old gentleman with a history of coronary disease, status post CABG x 4 in 2000 (LIMA to LAD, SVG to T1, SVG to PDA (left dominant), free radial graft to OM1. His heart cath in 2008 showed no obstructive coronary artery disease after his nuclear stress test was negative. EF was 40%; however, came back to normal in 2012. He is actually incredibly active at this point. 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 He does high impact activity. Recentl was involved with the [employer] program and had been going out to [facility] to receive treatment, until recently when he had a disagreement with his boss and ultimately lost his job and these treatments. This consisted of anti-aging therapy of vitamins, supplements, testosterone injections, etc. I reviewed laboratory work that was performed from that program, which indicates low hemoglobin A1c of 5.3. His CBC was within normal limits. Testosterone tests were at the high end of normal with low fasting serum insulin. His PSA was low. T3, TSH, and T4 were within normal limits. His lipid profile showed total cholesterol of 153, triglycerides 94, HDL 49, LDL 85, which is a good profile. He does have a history of intolerance to statins and has been maintained on fenofibrate and Zetia, and for not being on a statin, actually that LDL cholesterol is pretty low.   He recently discontinued his statin medication and reports marked improvement in chronic pain. He's also stopped high level activity and exercise and has cut back significantly on testosterone injections. He reports that he wishes now to get back to doing some exercise after he returns from a visit out of the country. He is curious to see what his cholesterol looks like today as it's been about 6 months off of medication.  Finally he is reporting some  tenderness and tingling with occasional swelling and a lump that develops under the left lateral malleolus. There is some associated numbness with this.  I saw Lee Mclaughlin back in the office today. He is reporting some abdominal bloating and recently has had problems with some fatigue. He decrease his exercise considerably. He's also had some weight gain. He had stopped taking testosterone but started back on it every 2 weeks. He needs to get back to his exercise. He denies any chest pain or shortness of breath. He's interested in a recheck of his cholesterol today.  PMHx:  Past Medical History  Diagnosis Date  . Coronary artery disease   . S/P CABG (coronary artery bypass graft) 2000  . H/O cardiovascular stress test 10/19/2012    MET test - good effort, peak VO2 of almost 112% predicted, HR 79% predicted, mildly ischemia at end of exercise  . Ischemic cardiomyopathy     history of   . Dyslipidemia   . History of nuclear stress test 03/2011    bruce myoview; mild perfusion defect in mid anteroseptal, apical septal, apical regions (infarct/scar/breast attenuation); post-stress EF 55%; no signficant ischemia    Past Surgical History  Procedure Laterality Date  . Coronary artery bypass graft  06/05/1999    LIMA to LAD, SVG to diagonal 1, SVG to PDA of Cfx, right radial graft to OM1 (Dr. Remus Loffler)   . Cervical spine surgery    . Cardiac catheterization  06/01/2007    no obstructive CAD, patent grafts, EF 40%, apical hypokinesis, anterolateral  hypokinesis (Dr. Gerrie Nordmann)   . Knee surgery Right   . Transthoracic echocardiogram  02/2008    EF 40%, severe apical wall hypokinesis, mod-severe anterior wall hypokinesis; RV mildly dilated; mild mitral annular calcif; mild TR, RSVP 30-68mHg    FAMHx:  Family History  Problem Relation Age of Onset  . CAD Father     s/p CABGx4 at 712 . Hyperlipidemia Father     SOCHx:   reports that he has been smoking Cigars.  He does not have any smokeless tobacco  history on file. He reports that he drinks alcohol. His drug history is not on file.  ALLERGIES:  Allergies  Allergen Reactions  . Crestor [Rosuvastatin]     Myalgias   . Vytorin [Ezetimibe-Simvastatin]     Myalgias     ROS: A comprehensive review of systems was negative except for: Gastrointestinal: positive for abdominal bloating  HOME MEDS: Current Outpatient Prescriptions  Medication Sig Dispense Refill  . aspirin 81 MG tablet Take 81 mg by mouth daily.    . carvedilol (COREG) 12.5 MG tablet Take 12.5 mg by mouth 2 (two) times daily with a meal.    . Cyanocobalamin (VITAMIN B-12 IJ) Inject as directed.    . fenofibrate micronized (LOFIBRA) 134 MG capsule Take 134 mg by mouth daily before breakfast.    . fish oil-omega-3 fatty acids 1000 MG capsule Take 2 g by mouth daily.    .Marland Kitchenlosartan (COZAAR) 100 MG tablet Take 100 mg by mouth daily.    .Marland KitchenrOPINIRole (REQUIP) 1 MG tablet Take 1 mg by mouth 4 (four) times daily.     . TESTOSTERONE IM Inject into the muscle. 0.2 mL every 10 days    . PROAIR HFA 108 (90 BASE) MCG/ACT inhaler as needed.    . zolpidem (AMBIEN CR) 12.5 MG CR tablet Take 1 tablet by mouth at bedtime as needed.     No current facility-administered medications for this visit.    LABS/IMAGING: No results found for this or any previous visit (from the past 48 hour(s)). No results found.  VITALS: BP 110/76 mmHg  Pulse 68  Ht _0  (1.753 m)  Wt 240 lb 6.4 oz (109.045 kg)  BMI 35.48 kg/m2  EXAM: General appearance: alert and no distress Neck: no carotid bruit and no JVD Lungs: clear to auscultation bilaterally Heart: regular rate and rhythm, S1, S2 normal, no murmur, click, rub or gallop Abdomen: soft, non-tender; bowel sounds normal; no masses,  no organomegaly Extremities: extremities normal, atraumatic, no cyanosis or edema and mild tenderness over the left lateral malleolus Pulses: 2+ and symmetric Skin: Skin color, texture, turgor normal. No rashes or  lesions Neurologic: Grossly normal Psych: Mood, affect normal  EKG: Normal sinus rhythm at 68  ASSESSMENT: 1. Coronary artery disease status post four-vessel CABG in 2000 2. Dyslipidemia-intolerant to statins 3. Possible gout 4. History of ischemic cardiomyopathy, EF 40% now improved to 55%  PLAN: 1.   Mr. MDadyis doing well but recently had some weight gain. He is some associated fatigue. He is interested in a recheck of his cholesterol and if possible would consider taking cholesterol medicine. He needs to continue to work on exercise. He denies any chest pain. We may need to consider repeat stress testing next year as his bypass surgery was in 2000.  KPixie Casino MD, F[facility] Navicent HealthAttending Cardiologist [facility]  Markiyah Gahm C 12/14/2014, 1:46 PM

## 2014-12-14 NOTE — Patient Instructions (Signed)
Your physician recommends that you return for lab work TODAY We will call you with the results.   Your physician wants you to follow-up in: 1 year with Dr. Rennis GoldenHilty.  You will receive a reminder letter in the mail two months in advance. If you don't receive a letter, please call our office to schedule the follow-up appointment.

## 2014-12-17 LAB — NMR LIPOPROFILE WITH LIPIDS
Cholesterol, Total: 240 mg/dL — ABNORMAL HIGH (ref 100–199)
HDL Particle Number: 29.3 umol/L — ABNORMAL LOW (ref >=30.5)
HDL SIZE: 8.7 nm — AB (ref >=9.2)
HDL-C: 46 mg/dL (ref >39)
LDL (calc): 155 mg/dL — ABNORMAL HIGH (ref 0–99)
LDL Particle Number: 1739 nmol/L — ABNORMAL HIGH (ref <1000)
LDL Size: 21.7 nm (ref >=20.8)
LP-IR Score: 67 — ABNORMAL HIGH (ref <=45)
Large HDL-P: 3.4 umol/L — ABNORMAL LOW (ref >=4.8)
Large VLDL-P: 9.9 nmol/L — ABNORMAL HIGH (ref <=2.7)
Small LDL Particle Number: 564 nmol/L — ABNORMAL HIGH (ref <=527)
Triglycerides: 194 mg/dL — ABNORMAL HIGH (ref 0–149)
VLDL Size: 51.3 nm — ABNORMAL HIGH (ref <=46.6)

## 2014-12-22 ENCOUNTER — Telehealth: Payer: Self-pay | Admitting: *Deleted

## 2014-12-22 MED ORDER — ROSUVASTATIN CALCIUM 5 MG PO TABS: 5.0000 mg | ORAL_TABLET | Freq: Every day | ORAL | Status: DC

## 2014-12-22 NOTE — Telephone Encounter (Signed)
-----   Message from Chrystie NoseKenneth C. Hilty, MD sent at 12/21/2014  5:39 PM EST ----- Let's try crestor 5mg  daily and see if he tolerates it.  He can call at any point if he gets myalgias.  Dr. HRexene Edison

## 2014-12-22 NOTE — Telephone Encounter (Signed)
Samples of Crestor 10mg  - take 1/2 tablet a day given.  Patient willing to try.  Will let us know if he has any myalgias.

## 2015-01-09 ENCOUNTER — Telehealth: Payer: Self-pay | Admitting: Internal Medicine

## 2015-01-09 NOTE — Telephone Encounter (Signed)
Spoke with pt, he has been on the crestor for about one week. He has noticed every time he has spicy foods he will get indigestion. He will get relief after taking about 8 tums. He is going to stop the crestor for a couple days to see if his symptoms get better. He will let us know if his symptoms improve to decide what else to do. He denies chest pain and reports this is stomach related.

## 2015-01-09 NOTE — Telephone Encounter (Signed)
Pt for the past 3 or 4 days have been having  heart burn. He wants to know if you think this might be a side effect of the Crestor?

## 2015-01-15 ENCOUNTER — Telehealth: Payer: Self-pay | Admitting: Internal Medicine

## 2015-01-15 NOTE — Telephone Encounter (Signed)
See triage call from 01/09/15

## 2015-01-15 NOTE — Telephone Encounter (Signed)
Pt says he stop taking the Crestor,everything went back to normal. Wants to know what can he take now?

## 2015-01-16 NOTE — Telephone Encounter (Signed)
Reflux would be an unusual side effect, but seems to be better. Ask him what other meds he has tried - we could try an alternative statin or Zetia.  Dr. HRexene Edison

## 2015-01-23 MED ORDER — PRAVASTATIN SODIUM 40 MG PO TABS: 40.0000 mg | ORAL_TABLET | Freq: Every evening | ORAL | Status: DC

## 2015-01-23 NOTE — Telephone Encounter (Signed)
Returned call to patient. He has tried almost all statins/zetia and had SE but understands the need to get his cholesterol under control.   He does not wish to re-try zetia, crestor (SE as noted), and vytorin is listed under allergies pitavastatin was too costly   Possible options: pravastatin, atorvastatin, simvastatin

## 2015-01-23 NOTE — Telephone Encounter (Signed)
Rx(s) sent to pharmacy electronically.  

## 2015-01-23 NOTE — Telephone Encounter (Signed)
Ok .. Can we try pravastatin 40 mg QHS? Thanks.  Dr. HRexene Edison

## 2015-03-19 ENCOUNTER — Telehealth: Payer: Self-pay | Admitting: Internal Medicine

## 2015-03-19 ENCOUNTER — Telehealth: Payer: Self-pay | Admitting: *Deleted

## 2015-03-19 NOTE — Telephone Encounter (Signed)
Pt called in stating that he is having pain in his elbows,knees,and ankles and he believes it is a side effect of the statin drug. Please f/u with pt  Thanks

## 2015-03-19 NOTE — Telephone Encounter (Signed)
Spoke to pt. He notes myalgia reemerged w/ pravastatin 40mg  daily. He has been on this dose for about 2 months.  He has previously tried statins (rosuvastatin, atorvastatin, pravastatin) w/ poor tolerance. Has intolerance to vytorin.  As pt explains, pitavastatin too expensive, does not want to re-try zetia.  He is not sure if he has tried simvastatin yet.  Pt is d/c'ing pravastatin until myalgias resolve. Will await advice from physician.  Routed to Dr. Rennis GoldenHilty.

## 2015-03-19 NOTE — Telephone Encounter (Signed)
Pt. Called and informed of Dr. Blanchie DessertHilty's instructions

## 2015-03-19 NOTE — Telephone Encounter (Signed)
Just have him stop the medicine.  We may need to discuss using a PCSK9 inhibitor (new medicine) with him - but he has to be on max tolerated statin dose - i.e., low dose crestor 5 mg weekly.  We can address this at his next follow-up.  Dr. HRexene Edison

## 2015-03-19 NOTE — Telephone Encounter (Signed)
Pt. Called and informed of Dr. Blanchie DessertHilty's instructions to stop his statin med and other options will be discussed on next visit

## 2015-03-19 NOTE — Telephone Encounter (Signed)
Pt called back, r/t message on machine. Restated Dr. Blanchie DessertHilty's instructions. He voiced understanding.

## 2015-05-17 ENCOUNTER — Other Ambulatory Visit: Payer: Self-pay | Admitting: *Deleted

## 2015-05-17 DIAGNOSIS — Z951 Presence of aortocoronary bypass graft: Secondary | ICD-10-CM

## 2015-05-17 DIAGNOSIS — Z01810 Encounter for preprocedural cardiovascular examination: Secondary | ICD-10-CM

## 2015-05-22 ENCOUNTER — Telehealth (HOSPITAL_COMMUNITY): Payer: Self-pay

## 2015-05-22 NOTE — Telephone Encounter (Signed)
Encounter complete. 

## 2015-05-24 ENCOUNTER — Telehealth (HOSPITAL_COMMUNITY): Payer: Self-pay

## 2015-05-24 ENCOUNTER — Ambulatory Visit (HOSPITAL_COMMUNITY)
Admission: RE | Admit: 2015-05-24 | Discharge: 2015-05-24 | Disposition: A | Discharge status: Home / Self Care | Payer: 59 | Source: Ambulatory Visit | Attending: Internal Medicine | Admitting: Internal Medicine

## 2015-05-24 DIAGNOSIS — R9439 Abnormal result of other cardiovascular function study: Secondary | ICD-10-CM | POA: Insufficient documentation

## 2015-05-24 DIAGNOSIS — Z01810 Encounter for preprocedural cardiovascular examination: Secondary | ICD-10-CM | POA: Insufficient documentation

## 2015-05-24 DIAGNOSIS — Z951 Presence of aortocoronary bypass graft: Secondary | ICD-10-CM

## 2015-05-24 MED ORDER — REGADENOSON 0.4 MG/5ML IV SOLN
0.4000 mg | Freq: Once | INTRAVENOUS | Status: AC
  Administered 2015-05-24: 0.4 mg via INTRAVENOUS

## 2015-05-24 MED ORDER — TECHNETIUM TC 99M SESTAMIBI GENERIC - CARDIOLITE
10.8000 | Freq: Once | INTRAVENOUS | Status: AC | PRN
  Administered 2015-05-24: 11 via INTRAVENOUS

## 2015-05-24 MED ORDER — AMINOPHYLLINE 25 MG/ML IV SOLN
100.0000 mg | Freq: Once | INTRAVENOUS | Status: AC
  Administered 2015-05-24: 100 mg via INTRAVENOUS

## 2015-05-24 MED ORDER — TECHNETIUM TC 99M SESTAMIBI GENERIC - CARDIOLITE
31.6000 | Freq: Once | INTRAVENOUS | Status: AC | PRN
  Administered 2015-05-24: 32 via INTRAVENOUS

## 2015-05-24 NOTE — Telephone Encounter (Signed)
Encounter complete. 

## 2015-05-25 LAB — MYOCARDIAL PERFUSION IMAGING
CHL CUP NUCLEAR SDS: 0
CHL CUP NUCLEAR SRS: 6
CHL CUP NUCLEAR SSS: 6
CSEPPHR: 80 {beats}/min
LV sys vol: 70 mL
LVDIAVOL: 134 mL
NUC STRESS TID: 1.11
Nuc Stress EF: 48 %

## 2015-05-28 ENCOUNTER — Telehealth: Payer: Self-pay | Admitting: *Deleted

## 2015-05-28 NOTE — Telephone Encounter (Signed)
LM for patient that he was cleared for surgery and that test was normal.   Routed test results with notation of (low risk) clearance for surgery (right total knee) to Dr. Thurston Hole and Sherri  Fax: [phone]   Phone: [phone] ext: [phone]

## 2015-07-04 ENCOUNTER — Encounter (HOSPITAL_COMMUNITY): Payer: Self-pay | Admitting: Physician Assistant

## 2015-07-04 NOTE — H&P (Signed)
TOTAL KNEE ADMISSION H&P  Patient is being admitted for right total knee arthroplasty.  Subjective:  Chief Complaint:right knee pain.  HPI: Lee Mclaughlin, 64 y.o. male, has a history of pain and functional disability in the right knee due to arthritis and has failed non-surgical conservative treatments for greater than 12 weeks to includeNSAID's and/or analgesics, corticosteriod injections, viscosupplementation injections, flexibility and strengthening excercises, supervised PT with diminished ADL's post treatment, use of assistive devices, weight reduction as appropriate and activity modification.  Onset of symptoms was gradual, starting 10 years ago with gradually worsening course since that time. The patient noted prior procedures on the knee to include  arthroscopy and menisectomy on the right knee(s).  Patient currently rates pain in the right knee(s) at 10 out of 10 with activity. Patient has night pain, worsening of pain with activity and weight bearing, pain that interferes with activities of daily living, crepitus and joint swelling.  Patient has evidence of subchondral sclerosis, periarticular osteophytes and joint space narrowing by imaging studies. There is no active infection.  Patient Active Problem List   Diagnosis Date Noted  . Primary localized osteoarthritis of right knee   . Staphylococcal arthritis of right knee   . CAD (coronary artery disease) 11/22/2013  . S/P CABG x 4 11/22/2013  . Dyslipidemia 11/22/2013  . Cardiomyopathy, ischemic 11/22/2013  . Statin intolerance 11/22/2013   Past Medical History  Diagnosis Date  . Coronary artery disease   . S/P CABG (coronary artery bypass graft) 2000  . H/O cardiovascular stress test 10/19/2012    MET test - good effort, peak VO2 of almost 112% predicted, HR 79% predicted, mildly ischemia at end of exercise  . Ischemic cardiomyopathy     history of   . Dyslipidemia   . History of nuclear stress test 03/2011    bruce myoview;  mild perfusion defect in mid anteroseptal, apical septal, apical regions (infarct/scar/breast attenuation); post-stress EF 55%; no signficant ischemia  . Primary localized osteoarthritis of right knee   . Staphylococcal arthritis of right knee 2004    Past Surgical History  Procedure Laterality Date  . Coronary artery bypass graft  06/05/1999    LIMA to LAD, SVG to diagonal 1, SVG to PDA of Cfx, right radial graft to OM1 (Dr. Remus Loffler)   . Cervical spine surgery    . Cardiac catheterization  06/01/2007    no obstructive CAD, patent grafts, EF 40%, apical hypokinesis, anterolateral hypokinesis (Dr. Gerrie Nordmann)   . Knee surgery Right   . Transthoracic echocardiogram  02/2008    EF 40%, severe apical wall hypokinesis, mod-severe anterior wall hypokinesis; RV mildly dilated; mild mitral annular calcif; mild TR, RSVP 30-66mHg    No current facility-administered medications for this encounter.  Current outpatient prescriptions:  .  aspirin 81 MG tablet, Take 81 mg by mouth daily., Disp: , Rfl:  .  carvedilol (COREG) 12.5 MG tablet, Take 12.5 mg by mouth 2 (two) times daily with a meal., Disp: , Rfl:  .  fenofibrate micronized (LOFIBRA) 134 MG capsule, Take 134 mg by mouth daily before breakfast., Disp: , Rfl:  .  fish oil-omega-3 fatty acids 1000 MG capsule, Take 2 g by mouth daily., Disp: , Rfl:  .  losartan (COZAAR) 100 MG tablet, Take 100 mg by mouth daily., Disp: , Rfl:  .  rOPINIRole (REQUIP) 1 MG tablet, Take 1 mg by mouth 4 (four) times daily. , Disp: , Rfl:  .  zolpidem (AMBIEN CR) 12.5 MG  CR tablet, Take 1 tablet by mouth at bedtime as needed., Disp: , Rfl:  .  PROAIR HFA 108 (90 BASE) MCG/ACT inhaler, as needed., Disp: , Rfl:  .  TESTOSTERONE IM, Inject into the muscle. 0.2 mL every 10 days, Disp: , Rfl:    Allergies  Allergen Reactions  . Crestor [Rosuvastatin]     Myalgias   . Vytorin [Ezetimibe-Simvastatin]     Myalgias     History  Substance Use Topics  . Smoking status:  Former Smoker    Types: Cigars  . Smokeless tobacco: Not on file  . Alcohol Use: 0.0 oz/week    0 Standard drinks or equivalent per week    Family History  Problem Relation Age of Onset  . CAD Father     s/p CABGx4 at 39  . Hyperlipidemia Father      Review of Systems  Constitutional: Negative.   HENT: Negative.   Eyes: Negative.   Respiratory: Negative.   Cardiovascular: Negative.   Gastrointestinal: Negative.   Genitourinary: Negative.   Musculoskeletal: Positive for joint pain.  Skin: Negative.   Neurological: Negative.   Endo/Heme/Allergies: Negative.   Psychiatric/Behavioral: Negative.     Objective:  Physical Exam  Constitutional: He is oriented to person, place, and time. He appears well-developed and well-nourished.  HENT:  Head: Normocephalic and atraumatic.  Mouth/Throat: Oropharynx is clear and moist.  Eyes: Conjunctivae are normal. Pupils are equal, round, and reactive to light.  Cardiovascular: Normal rate, regular rhythm and normal heart sounds.   Respiratory: Effort normal and breath sounds normal.  GI: Soft. Bowel sounds are normal.  Genitourinary:  Not pertinent to current symptomatology therefore not examined.  Musculoskeletal:  Examination of his right knee reveals 2+ crepitation.  2+ synovitis.  Diffuse pain.  Range of motion 0-120 degrees.  Knee is stable with normal patella tracking.  Examination of his left knee reveals full range of motion without pain, swelling, weakness or instability.  Vascular exam: Pulses are 2+ and symmetric.    Neurological: He is alert and oriented to person, place, and time.  Skin: Skin is dry.  Psychiatric: He has a normal mood and affect. His behavior is normal.    Vital signs in last 24 hours: Temp:  [98.7 F (37.1 C)] 98.7 F (37.1 C) (07/27 1200) Pulse Rate:  [73] 73 (07/27 1200) BP: (133)/(86) 133/86 mmHg (07/27 1200) SpO2:  [98 %] 98 % (07/27 1200) Weight:  [106.595 kg (235 lb)] 106.595 kg (235 lb) (07/27  1200)  Labs:   Estimated body mass index is 35.74 kg/(m^2) as calculated from the following:   Height as of this encounter: _0  (1.727 m).   Weight as of this encounter: 106.595 kg (235 lb).   Imaging Review Plain radiographs demonstrate severe degenerative joint disease of the right knee(s). The overall alignment issignificant varus. The bone quality appears to be good for age and reported activity level.  Assessment/Plan:  End stage arthritis, right knee  Principal Problem:   Primary localized osteoarthritis of right knee Active Problems:   CAD (coronary artery disease)   S/P CABG x 4   Dyslipidemia   Cardiomyopathy, ischemic   Statin intolerance   Staphylococcal arthritis of right knee   The patient history, physical examination, clinical judgment of the provider and imaging studies are consistent with end stage degenerative joint disease of the right knee(s) and total knee arthroplasty is deemed medically necessary. The treatment options including medical management, injection therapy arthroscopy and arthroplasty were discussed  at length. The risks and benefits of total knee arthroplasty were presented and reviewed. The risks due to aseptic loosening, infection, stiffness, patella tracking problems, thromboembolic complications and other imponderables were discussed. The patient acknowledged the explanation, agreed to proceed with the plan and consent was signed. Patient is being admitted for inpatient treatment for surgery, pain control, PT, OT, prophylactic antibiotics, VTE prophylaxis, progressive ambulation and ADL's and discharge planning. The patient is planning to be discharged home with home health services  Heike Pounds A. Kaleen Mask Physician Assistant [facility] [phone]  07/04/2015, 1:02 PM

## 2015-07-13 ENCOUNTER — Encounter (HOSPITAL_COMMUNITY)
Admission: RE | Admit: 2015-07-13 | Discharge: 2015-07-13 | Disposition: A | Discharge status: Home / Self Care | Payer: 59 | Source: Ambulatory Visit | Attending: Orthopedic Surgery | Admitting: Orthopedic Surgery

## 2015-07-13 ENCOUNTER — Encounter (HOSPITAL_COMMUNITY): Payer: Self-pay

## 2015-07-13 DIAGNOSIS — E785 Hyperlipidemia, unspecified: Secondary | ICD-10-CM | POA: Diagnosis not present

## 2015-07-13 DIAGNOSIS — Z7982 Long term (current) use of aspirin: Secondary | ICD-10-CM | POA: Insufficient documentation

## 2015-07-13 DIAGNOSIS — Z87891 Personal history of nicotine dependence: Secondary | ICD-10-CM | POA: Insufficient documentation

## 2015-07-13 DIAGNOSIS — I252 Old myocardial infarction: Secondary | ICD-10-CM | POA: Diagnosis not present

## 2015-07-13 DIAGNOSIS — Z951 Presence of aortocoronary bypass graft: Secondary | ICD-10-CM | POA: Diagnosis not present

## 2015-07-13 DIAGNOSIS — M179 Osteoarthritis of knee, unspecified: Secondary | ICD-10-CM | POA: Insufficient documentation

## 2015-07-13 DIAGNOSIS — Z01818 Encounter for other preprocedural examination: Secondary | ICD-10-CM | POA: Diagnosis not present

## 2015-07-13 DIAGNOSIS — Z79899 Other long term (current) drug therapy: Secondary | ICD-10-CM | POA: Diagnosis not present

## 2015-07-13 DIAGNOSIS — Z0183 Encounter for blood typing: Secondary | ICD-10-CM | POA: Insufficient documentation

## 2015-07-13 DIAGNOSIS — I2510 Atherosclerotic heart disease of native coronary artery without angina pectoris: Secondary | ICD-10-CM | POA: Insufficient documentation

## 2015-07-13 HISTORY — DX: Personal history of other endocrine, nutritional and metabolic disease: Z86.39

## 2015-07-13 HISTORY — DX: Acute myocardial infarction, unspecified: I21.9

## 2015-07-13 HISTORY — DX: Other specified postprocedural states: R11.2

## 2015-07-13 HISTORY — DX: Presence of spectacles and contact lenses: Z97.3

## 2015-07-13 HISTORY — DX: Other specified postprocedural states: Z98.890

## 2015-07-13 LAB — CBC WITH DIFFERENTIAL/PLATELET
Basophils Absolute: 0.1 10*3/uL (ref 0.0–0.1)
Basophils Relative: 1 % (ref 0–1)
EOS PCT: 8 % — AB (ref 0–5)
Eosinophils Absolute: 0.5 10*3/uL (ref 0.0–0.7)
HEMATOCRIT: 47.8 % (ref 39.0–52.0)
Hemoglobin: 16.4 g/dL (ref 13.0–17.0)
LYMPHS ABS: 3.1 10*3/uL (ref 0.7–4.0)
Lymphocytes Relative: 46 % (ref 12–46)
MCH: 31.1 pg (ref 26.0–34.0)
MCHC: 34.3 g/dL (ref 30.0–36.0)
MCV: 90.7 fL (ref 78.0–100.0)
MONO ABS: 0.6 10*3/uL (ref 0.1–1.0)
MONOS PCT: 9 % (ref 3–12)
NEUTROS PCT: 36 % — AB (ref 43–77)
Neutro Abs: 2.4 10*3/uL (ref 1.7–7.7)
PLATELETS: 156 10*3/uL (ref 150–400)
RBC: 5.27 MIL/uL (ref 4.22–5.81)
RDW: 12.6 % (ref 11.5–15.5)
WBC: 6.7 10*3/uL (ref 4.0–10.5)

## 2015-07-13 LAB — PROTIME-INR
INR: 1.12 (ref 0.00–1.49)
Prothrombin Time: 14.6 seconds (ref 11.6–15.2)

## 2015-07-13 LAB — COMPREHENSIVE METABOLIC PANEL
ALT: 24 U/L (ref 17–63)
ANION GAP: 9 (ref 5–15)
AST: 26 U/L (ref 15–41)
Albumin: 4.5 g/dL (ref 3.5–5.0)
Alkaline Phosphatase: 32 U/L — ABNORMAL LOW (ref 38–126)
BUN: 29 mg/dL — ABNORMAL HIGH (ref 6–20)
CO2: 27 mmol/L (ref 22–32)
Calcium: 9.8 mg/dL (ref 8.9–10.3)
Chloride: 100 mmol/L — ABNORMAL LOW (ref 101–111)
Creatinine, Ser: 1.17 mg/dL (ref 0.61–1.24)
GFR calc Af Amer: >60 mL/min (ref >60)
GFR calc non Af Amer: >60 mL/min (ref >60)
Glucose, Bld: 92 mg/dL (ref 65–99)
POTASSIUM: 4.2 mmol/L (ref 3.5–5.1)
Sodium: 136 mmol/L (ref 135–145)
TOTAL PROTEIN: 7.3 g/dL (ref 6.5–8.1)
Total Bilirubin: 1.1 mg/dL (ref 0.3–1.2)

## 2015-07-13 LAB — ABO/RH: ABO/RH(D): AB POS

## 2015-07-13 LAB — TYPE AND SCREEN
ABO/RH(D): AB POS
ANTIBODY SCREEN: NEGATIVE

## 2015-07-13 LAB — APTT: APTT: 28 s (ref 24–37)

## 2015-07-13 LAB — SURGICAL PCR SCREEN
MRSA, PCR: NEGATIVE
Staphylococcus aureus: POSITIVE — AB

## 2015-07-13 MED ORDER — CHLORHEXIDINE GLUCONATE 4 % EX LIQD: 60.0000 mL | Freq: Once | CUTANEOUS | Status: DC

## 2015-07-13 MED ORDER — POVIDONE-IODINE 7.5 % EX SOLN: Freq: Once | CUTANEOUS | Status: DC

## 2015-07-13 NOTE — Progress Notes (Signed)
Patient has Mupricion at home.

## 2015-07-13 NOTE — Progress Notes (Signed)
Patient states that he has to have his Requip (1 mg, 4 times daily) while in the hospital.

## 2015-07-13 NOTE — Pre-Procedure Instructions (Addendum)
    Lee Mclaughlin  07/13/2015      [id number] - [place], [place] - [address] AT CORNER OF [facility] [address] [place] [address] Phone: [phone] Fax: [phone]  [facility] [address] - [place], [place] - [address] [place] [address] Phone: [phone] Fax: [phone]    Your procedure is scheduled on Monday, August 15th, 2016.  Report to [facility] Admitting at 7:55 A.M.  Call this number if you have problems the morning of surgery:  [phone]   Remember:  Do not eat food or drink liquids after midnight.   Take these medicines the morning of surgery with A SIP OF WATER: Carvedilol (Coreg), Proair Inhaler, Ropinirole (Requip), Tramadol (Ultram) if needed.  Stop taking the following 7 days prior to surgery: NSAIDS, Ibuprofen, Naproxen, Aleve, Advil, Motrin, Fish Oil, all herbal medications and all vitamins.    Do not wear jewelry.  Do not wear lotions, powders, or colognes.  You may wear deodorant.  Men may shave face and neck.  Do not bring valuables to the hospital.  [facility] is not responsible for any belongings or valuables.  Contacts, dentures or bridgework may not be worn into surgery.  Leave your suitcase in the car.  After surgery it may be brought to your room.  For patients admitted to the hospital, discharge time will be determined by your treatment team.  Patients discharged the day of surgery will not be allowed to drive home.   Special instructions:  See attached.   Please read over the following fact sheets that you were given. Pain Booklet, Coughing and Deep Breathing, Blood Transfusion Information, MRSA Information and Surgical Site Infection Prevention

## 2015-07-13 NOTE — Progress Notes (Signed)
Patient states the he was told to keep taking his baby Aspirin prior to surgery.    Patient states that he was started on Mupirocin 2% ointment to use at bedtime prior to surgery due to previous Staph infection.

## 2015-07-13 NOTE — Progress Notes (Signed)
PCP - Dr. Arlyce Dice Cardiologist - Dr. Rennis Golden      -Cardiac Clearance in Epic 05/28/15  EKG- January 2016 - Epic CXR - denies - pt. Denies recent fever, cough  Echo - 02/2008- Epic Stress Test - 05/24/2015 - Epic Cardiac Cath - 2008 - Epic  Pt. Denies chest pain at PAT appointment.  Pt. States that he had sleep study completed more than 7 years ago and he does not have sleep apnea.

## 2015-07-14 LAB — URINE CULTURE: Culture: 2000

## 2015-07-16 NOTE — Progress Notes (Signed)
Anesthesia Chart Review:  Pt is 64 year old male scheduled for R total knee arthroplasty on 07/23/2015 with Dr. Thurston Hole.   Cardiologist is Dr. Rennis Golden.   PMH includes: CAD (MI, s/p CABG 2000: LIMA to LAD, SVG to T1, SVG to PDA (left dominant), free radial graft to OM1), ischemic cardiomyopathy, dyslipidemia. Former smoker. BMI 35.   Anesthesia history includes post-op nausea and vomiting.   Medications include: ASA, carvedilol, fenofibrate, losartan, albuterol, requip, testosterone.   Preoperative labs reviewed.    EKG 12/14/2014: NSR with sinus arrhythmia. Anteroseptal infarct, age undetermined.   Nuclear stress test 05/24/2015: -Low risk pharmacological stress nuclear study with a small and mild apicoseptal fixed perfusion defect and normal left ventricular regional and global systolic function. -EF 48%  Echo 02/29/2008:  -flat closure of mitral valve -LV systolic function is mild to moderately reduced. EF 40% -The transmitral spectral Doppler flow pattern is suggestive of impaired LV relaxation -There is moderate to severe apical wall hypokinesis -There is moderate to severe anterior wall hypokinesis -The RV is mildly dilated -The LA is mildly dilated -The RA is mildly dilated -There is mild tricuspid regurgitation -RV systolic pressure is elevated at 30-40 mmHg  By notes, pt had cardiac cath in 2008 that showed no obstructive CAD, patent grafts, EF 40%, apical hypokinesis, anterolateral hypokinesis.  Pt has cardiac clearance for surgery from Dr. Rennis Golden in notes area of stress test results.   If no changes, I anticipate pt can proceed with surgery as scheduled.   Rica Mast, FNP-BC [facility]/Anesthesiology Phone: [phone] 07/16/2015 3:49 PM

## 2015-07-20 MED ORDER — LACTATED RINGERS IV SOLN: INTRAVENOUS | Status: DC

## 2015-07-20 MED ORDER — DEXTROSE 5 % IV SOLN
3.0000 g | INTRAVENOUS | Status: AC
  Administered 2015-07-23: 3 g via INTRAVENOUS
  Filled 2015-07-20 (×2): qty 3000

## 2015-07-23 ENCOUNTER — Inpatient Hospital Stay (HOSPITAL_COMMUNITY): Payer: 59 | Admitting: Certified Registered"

## 2015-07-23 ENCOUNTER — Encounter (HOSPITAL_COMMUNITY): Payer: Self-pay | Admitting: Certified Registered"

## 2015-07-23 ENCOUNTER — Inpatient Hospital Stay (HOSPITAL_COMMUNITY): Payer: 59 | Admitting: Emergency Medicine

## 2015-07-23 ENCOUNTER — Inpatient Hospital Stay (HOSPITAL_COMMUNITY)
Admission: RE | Admit: 2015-07-23 | Discharge: 2015-07-25 | DRG: 470 | Disposition: A | Discharge status: Home Health | Payer: 59 | Source: Ambulatory Visit | Attending: Orthopedic Surgery | Admitting: Orthopedic Surgery

## 2015-07-23 ENCOUNTER — Encounter (HOSPITAL_COMMUNITY)
Admission: RE | Disposition: A | Discharge status: Home Health | Payer: Self-pay | Source: Ambulatory Visit | Attending: Orthopedic Surgery

## 2015-07-23 DIAGNOSIS — I255 Ischemic cardiomyopathy: Secondary | ICD-10-CM | POA: Diagnosis present

## 2015-07-23 DIAGNOSIS — E785 Hyperlipidemia, unspecified: Secondary | ICD-10-CM | POA: Diagnosis present

## 2015-07-23 DIAGNOSIS — I2510 Atherosclerotic heart disease of native coronary artery without angina pectoris: Secondary | ICD-10-CM | POA: Diagnosis present

## 2015-07-23 DIAGNOSIS — Z789 Other specified health status: Secondary | ICD-10-CM

## 2015-07-23 DIAGNOSIS — I252 Old myocardial infarction: Secondary | ICD-10-CM | POA: Diagnosis not present

## 2015-07-23 DIAGNOSIS — Z951 Presence of aortocoronary bypass graft: Secondary | ICD-10-CM | POA: Diagnosis not present

## 2015-07-23 DIAGNOSIS — M1711 Unilateral primary osteoarthritis, right knee: Secondary | ICD-10-CM | POA: Diagnosis present

## 2015-07-23 DIAGNOSIS — Z87891 Personal history of nicotine dependence: Secondary | ICD-10-CM | POA: Diagnosis not present

## 2015-07-23 DIAGNOSIS — I429 Cardiomyopathy, unspecified: Secondary | ICD-10-CM | POA: Diagnosis present

## 2015-07-23 DIAGNOSIS — M00061 Staphylococcal arthritis, right knee: Secondary | ICD-10-CM | POA: Diagnosis present

## 2015-07-23 DIAGNOSIS — M179 Osteoarthritis of knee, unspecified: Secondary | ICD-10-CM | POA: Diagnosis present

## 2015-07-23 DIAGNOSIS — M1710 Unilateral primary osteoarthritis, unspecified knee: Secondary | ICD-10-CM | POA: Diagnosis present

## 2015-07-23 HISTORY — DX: Unilateral primary osteoarthritis, right knee: M17.11

## 2015-07-23 HISTORY — PX: TOTAL KNEE ARTHROPLASTY: SHX125

## 2015-07-23 HISTORY — DX: Staphylococcal arthritis, right knee: M00.061

## 2015-07-23 SURGERY — ARTHROPLASTY, KNEE, TOTAL
Anesthesia: Monitor Anesthesia Care | Site: Knee | Laterality: Right

## 2015-07-23 MED ORDER — DEXAMETHASONE SODIUM PHOSPHATE 10 MG/ML IJ SOLN
INTRAMUSCULAR | Status: DC | PRN
  Administered 2015-07-23: 10 mg via INTRAVENOUS

## 2015-07-23 MED ORDER — PROPOFOL INFUSION 10 MG/ML OPTIME
INTRAVENOUS | Status: DC | PRN
  Administered 2015-07-23: 25 ug/kg/min via INTRAVENOUS

## 2015-07-23 MED ORDER — APIXABAN 2.5 MG PO TABS
2.5000 mg | ORAL_TABLET | Freq: Two times a day (BID) | ORAL | Status: DC
  Administered 2015-07-24 – 2015-07-25 (×3): 2.5 mg via ORAL
  Filled 2015-07-23 (×3): qty 1

## 2015-07-23 MED ORDER — MUPIROCIN 2 % EX OINT: 1.0000 "application " | TOPICAL_OINTMENT | Freq: Two times a day (BID) | CUTANEOUS | Status: DC

## 2015-07-23 MED ORDER — PROMETHAZINE HCL 25 MG/ML IJ SOLN: 6.2500 mg | INTRAMUSCULAR | Status: DC | PRN

## 2015-07-23 MED ORDER — DEXAMETHASONE SODIUM PHOSPHATE 10 MG/ML IJ SOLN
10.0000 mg | Freq: Three times a day (TID) | INTRAMUSCULAR | Status: DC
Start: 2015-07-23 — End: 2015-07-24
  Administered 2015-07-23 – 2015-07-24 (×4): 10 mg via INTRAVENOUS
  Filled 2015-07-23 (×4): qty 1

## 2015-07-23 MED ORDER — CEFAZOLIN SODIUM-DEXTROSE 2-3 GM-% IV SOLR
2.0000 g | Freq: Four times a day (QID) | INTRAVENOUS | Status: AC
  Administered 2015-07-23 (×2): 2 g via INTRAVENOUS
  Filled 2015-07-23 (×3): qty 50

## 2015-07-23 MED ORDER — OXYCODONE HCL ER 10 MG PO T12A
20.0000 mg | EXTENDED_RELEASE_TABLET | Freq: Two times a day (BID) | ORAL | Status: DC
Start: 2015-07-23 — End: 2015-07-25
  Administered 2015-07-23 – 2015-07-25 (×5): 20 mg via ORAL
  Filled 2015-07-23 (×5): qty 2

## 2015-07-23 MED ORDER — LACTATED RINGERS IV SOLN
INTRAVENOUS | Status: DC
  Administered 2015-07-23: 08:00:00 via INTRAVENOUS

## 2015-07-23 MED ORDER — ONDANSETRON HCL 4 MG PO TABS: 4.0000 mg | ORAL_TABLET | Freq: Four times a day (QID) | ORAL | Status: DC | PRN

## 2015-07-23 MED ORDER — ACETAMINOPHEN 325 MG PO TABS: 650.0000 mg | ORAL_TABLET | Freq: Four times a day (QID) | ORAL | Status: DC | PRN

## 2015-07-23 MED ORDER — LOSARTAN POTASSIUM 50 MG PO TABS
100.0000 mg | ORAL_TABLET | Freq: Every day | ORAL | Status: DC
  Administered 2015-07-24 – 2015-07-25 (×2): 100 mg via ORAL
  Filled 2015-07-23 (×4): qty 2

## 2015-07-23 MED ORDER — METOCLOPRAMIDE HCL 5 MG PO TABS: 5.0000 mg | ORAL_TABLET | Freq: Three times a day (TID) | ORAL | Status: DC | PRN

## 2015-07-23 MED ORDER — SODIUM CHLORIDE 0.9 % IR SOLN
Status: DC | PRN
  Administered 2015-07-23: 3000 mL

## 2015-07-23 MED ORDER — ALUM & MAG HYDROXIDE-SIMETH 200-200-20 MG/5ML PO SUSP
30.0000 mL | ORAL | Status: DC | PRN
  Administered 2015-07-24 (×2): 30 mL via ORAL
  Filled 2015-07-23 (×2): qty 30

## 2015-07-23 MED ORDER — BUPIVACAINE-EPINEPHRINE 0.25% -1:200000 IJ SOLN
INTRAMUSCULAR | Status: DC | PRN
  Administered 2015-07-23: 30 mL

## 2015-07-23 MED ORDER — ROPINIROLE HCL 1 MG PO TABS
1.0000 mg | ORAL_TABLET | Freq: Four times a day (QID) | ORAL | Status: DC
  Administered 2015-07-23 – 2015-07-25 (×7): 1 mg via ORAL
  Filled 2015-07-23 (×7): qty 1

## 2015-07-23 MED ORDER — PROPOFOL 10 MG/ML IV BOLUS
INTRAVENOUS | Status: AC
  Filled 2015-07-23: qty 20

## 2015-07-23 MED ORDER — POLYETHYLENE GLYCOL 3350 17 G PO PACK
17.0000 g | PACK | Freq: Two times a day (BID) | ORAL | Status: DC
  Administered 2015-07-23 – 2015-07-24 (×3): 17 g via ORAL
  Filled 2015-07-23 (×5): qty 1

## 2015-07-23 MED ORDER — LACTATED RINGERS IV SOLN
INTRAVENOUS | Status: DC | PRN
  Administered 2015-07-23: 09:00:00 via INTRAVENOUS

## 2015-07-23 MED ORDER — MENTHOL 3 MG MT LOZG
1.0000 | LOZENGE | OROMUCOSAL | Status: DC | PRN
Start: 2015-07-23 — End: 2015-07-25

## 2015-07-23 MED ORDER — PHENOL 1.4 % MT LIQD: 1.0000 | OROMUCOSAL | Status: DC | PRN

## 2015-07-23 MED ORDER — CEFUROXIME SODIUM 1.5 G IJ SOLR
INTRAMUSCULAR | Status: DC | PRN
  Administered 2015-07-23: 1.5 g

## 2015-07-23 MED ORDER — 0.9 % SODIUM CHLORIDE (POUR BTL) OPTIME
TOPICAL | Status: DC | PRN
  Administered 2015-07-23: 1000 mL

## 2015-07-23 MED ORDER — FENTANYL CITRATE (PF) 100 MCG/2ML IJ SOLN
INTRAMUSCULAR | Status: DC | PRN
  Administered 2015-07-23: 50 ug via INTRAVENOUS
  Administered 2015-07-23 (×2): 25 ug via INTRAVENOUS

## 2015-07-23 MED ORDER — OXYCODONE HCL 5 MG PO TABS
5.0000 mg | ORAL_TABLET | ORAL | Status: DC | PRN
  Administered 2015-07-23 – 2015-07-25 (×7): 10 mg via ORAL
  Filled 2015-07-23 (×3): qty 2
  Filled 2015-07-23: qty 1
  Filled 2015-07-23 (×4): qty 2

## 2015-07-23 MED ORDER — CELECOXIB 200 MG PO CAPS
200.0000 mg | ORAL_CAPSULE | Freq: Two times a day (BID) | ORAL | Status: DC
  Administered 2015-07-23 – 2015-07-25 (×5): 200 mg via ORAL
  Filled 2015-07-23 (×5): qty 1

## 2015-07-23 MED ORDER — ALBUTEROL SULFATE (2.5 MG/3ML) 0.083% IN NEBU: 3.0000 mL | INHALATION_SOLUTION | Freq: Four times a day (QID) | RESPIRATORY_TRACT | Status: DC | PRN

## 2015-07-23 MED ORDER — MIDAZOLAM HCL 2 MG/2ML IJ SOLN
INTRAMUSCULAR | Status: AC
  Administered 2015-07-23: 2 mg
  Filled 2015-07-23: qty 2

## 2015-07-23 MED ORDER — POTASSIUM CHLORIDE IN NACL 20-0.9 MEQ/L-% IV SOLN
INTRAVENOUS | Status: DC
  Administered 2015-07-23 – 2015-07-24 (×2): via INTRAVENOUS
  Filled 2015-07-23 (×2): qty 1000

## 2015-07-23 MED ORDER — CEFUROXIME SODIUM 1.5 G IJ SOLR
INTRAMUSCULAR | Status: AC
  Filled 2015-07-23: qty 1.5

## 2015-07-23 MED ORDER — ACETAMINOPHEN 650 MG RE SUPP: 650.0000 mg | Freq: Four times a day (QID) | RECTAL | Status: DC | PRN

## 2015-07-23 MED ORDER — METOCLOPRAMIDE HCL 5 MG/ML IJ SOLN: 5.0000 mg | Freq: Three times a day (TID) | INTRAMUSCULAR | Status: DC | PRN

## 2015-07-23 MED ORDER — HYDROMORPHONE HCL 1 MG/ML IJ SOLN
0.2500 mg | INTRAMUSCULAR | Status: DC | PRN
  Administered 2015-07-23: 0.5 mg via INTRAVENOUS

## 2015-07-23 MED ORDER — MIDAZOLAM HCL 2 MG/2ML IJ SOLN
INTRAMUSCULAR | Status: AC
  Filled 2015-07-23: qty 4

## 2015-07-23 MED ORDER — HYDROMORPHONE HCL 1 MG/ML IJ SOLN
INTRAMUSCULAR | Status: AC
  Filled 2015-07-23: qty 1

## 2015-07-23 MED ORDER — CARVEDILOL 12.5 MG PO TABS
12.5000 mg | ORAL_TABLET | Freq: Two times a day (BID) | ORAL | Status: DC
  Administered 2015-07-23 – 2015-07-25 (×4): 12.5 mg via ORAL
  Filled 2015-07-23 (×5): qty 1

## 2015-07-23 MED ORDER — DIPHENHYDRAMINE HCL 12.5 MG/5ML PO ELIX
12.5000 mg | ORAL_SOLUTION | ORAL | Status: DC | PRN
Start: 2015-07-23 — End: 2015-07-25
  Administered 2015-07-24: 25 mg via ORAL
  Filled 2015-07-23: qty 10

## 2015-07-23 MED ORDER — BUPIVACAINE-EPINEPHRINE (PF) 0.25% -1:200000 IJ SOLN
INTRAMUSCULAR | Status: AC
  Filled 2015-07-23: qty 30

## 2015-07-23 MED ORDER — BUPIVACAINE HCL (PF) 0.75 % IJ SOLN
INTRAMUSCULAR | Status: DC | PRN
  Administered 2015-07-23: 2 mL

## 2015-07-23 MED ORDER — FENOFIBRATE 54 MG PO TABS
54.0000 mg | ORAL_TABLET | Freq: Every day | ORAL | Status: DC
  Administered 2015-07-23 – 2015-07-25 (×3): 54 mg via ORAL
  Filled 2015-07-23 (×3): qty 1

## 2015-07-23 MED ORDER — DEXAMETHASONE SODIUM PHOSPHATE 10 MG/ML IJ SOLN
INTRAMUSCULAR | Status: AC
  Filled 2015-07-23: qty 1

## 2015-07-23 MED ORDER — FENTANYL CITRATE (PF) 100 MCG/2ML IJ SOLN
INTRAMUSCULAR | Status: AC
  Administered 2015-07-23: 100 ug
  Filled 2015-07-23: qty 2

## 2015-07-23 MED ORDER — HYDROMORPHONE HCL 1 MG/ML IJ SOLN
1.0000 mg | INTRAMUSCULAR | Status: DC | PRN
  Administered 2015-07-23 – 2015-07-24 (×3): 1 mg via INTRAVENOUS
  Filled 2015-07-23 (×4): qty 1

## 2015-07-23 MED ORDER — DOCUSATE SODIUM 100 MG PO CAPS
100.0000 mg | ORAL_CAPSULE | Freq: Two times a day (BID) | ORAL | Status: DC
  Administered 2015-07-23 – 2015-07-25 (×5): 100 mg via ORAL
  Filled 2015-07-23 (×5): qty 1

## 2015-07-23 MED ORDER — ONDANSETRON HCL 4 MG/2ML IJ SOLN: 4.0000 mg | Freq: Four times a day (QID) | INTRAMUSCULAR | Status: DC | PRN

## 2015-07-23 MED ORDER — FENTANYL CITRATE (PF) 250 MCG/5ML IJ SOLN
INTRAMUSCULAR | Status: AC
  Filled 2015-07-23: qty 5

## 2015-07-23 SURGICAL SUPPLY — 73 items
APL SKNCLS STERI-STRIP NONHPOA (GAUZE/BANDAGES/DRESSINGS) ×1
BANDAGE ESMARK 6X9 LF (GAUZE/BANDAGES/DRESSINGS) ×1 IMPLANT
BENZOIN TINCTURE PRP APPL 2/3 (GAUZE/BANDAGES/DRESSINGS) ×3 IMPLANT
BLADE SAGITTAL 25.0X1.19X90 (BLADE) ×2 IMPLANT
BLADE SAGITTAL 25.0X1.19X90MM (BLADE) ×1
BLADE SAW SGTL 11.0X1.19X90.0M (BLADE) IMPLANT
BLADE SAW SGTL 13.0X1.19X90.0M (BLADE) ×3 IMPLANT
BLADE SURG 10 STRL SS (BLADE) ×10 IMPLANT
BNDG CMPR 9X6 STRL LF SNTH (GAUZE/BANDAGES/DRESSINGS) ×1
BNDG CMPR MED 15X6 ELC VLCR LF (GAUZE/BANDAGES/DRESSINGS) ×1
BNDG COHESIVE 4X5 TAN STRL (GAUZE/BANDAGES/DRESSINGS) ×2 IMPLANT
BNDG ELASTIC 6X15 VLCR STRL LF (GAUZE/BANDAGES/DRESSINGS) ×3 IMPLANT
BNDG ESMARK 6X9 LF (GAUZE/BANDAGES/DRESSINGS) ×3
BOWL SMART MIX CTS (DISPOSABLE) ×3 IMPLANT
CAPT KNEE TOTAL 3 ATTUNE ×2 IMPLANT
CEMENT HV SMART SET (Cement) ×6 IMPLANT
CLOSURE WOUND 1/2 X4 (GAUZE/BANDAGES/DRESSINGS) ×1
COVER SURGICAL LIGHT HANDLE (MISCELLANEOUS) ×3 IMPLANT
CUFF TOURNIQUET SINGLE 34IN LL (TOURNIQUET CUFF) ×3 IMPLANT
CUFF TOURNIQUET SINGLE 44IN (TOURNIQUET CUFF) IMPLANT
DRAPE EXTREMITY T 121X128X90 (DRAPE) ×3 IMPLANT
DRAPE IMP U-DRAPE 54X76 (DRAPES) ×3 IMPLANT
DRAPE INCISE IOBAN 66X45 STRL (DRAPES) ×3 IMPLANT
DRAPE PROXIMA HALF (DRAPES) ×3 IMPLANT
DRAPE U-SHAPE 47X51 STRL (DRAPES) ×3 IMPLANT
DRSG AQUACEL AG ADV 3.5X14 (GAUZE/BANDAGES/DRESSINGS) ×3 IMPLANT
DRSG PAD ABDOMINAL 8X10 ST (GAUZE/BANDAGES/DRESSINGS) ×8 IMPLANT
DURAPREP 26ML APPLICATOR (WOUND CARE) ×6 IMPLANT
ELECT CAUTERY BLADE 6.4 (BLADE) ×3 IMPLANT
ELECT REM PT RETURN 9FT ADLT (ELECTROSURGICAL) ×3
ELECTRODE REM PT RTRN 9FT ADLT (ELECTROSURGICAL) ×1 IMPLANT
EVACUATOR 1/8 PVC DRAIN (DRAIN) ×3 IMPLANT
FACESHIELD WRAPAROUND (MASK) ×3 IMPLANT
FACESHIELD WRAPAROUND OR TEAM (MASK) ×1 IMPLANT
GAUZE SPONGE 4X4 12PLY STRL (GAUZE/BANDAGES/DRESSINGS) ×3 IMPLANT
GLOVE BIO SURGEON STRL SZ7 (GLOVE) ×5 IMPLANT
GLOVE BIOGEL PI IND STRL 7.0 (GLOVE) ×1 IMPLANT
GLOVE BIOGEL PI IND STRL 7.5 (GLOVE) ×1 IMPLANT
GLOVE BIOGEL PI INDICATOR 7.0 (GLOVE) ×8
GLOVE BIOGEL PI INDICATOR 7.5 (GLOVE) ×4
GLOVE SS BIOGEL STRL SZ 7.5 (GLOVE) ×1 IMPLANT
GLOVE SUPERSENSE BIOGEL SZ 7.5 (GLOVE) ×2
GOWN STRL REUS W/ TWL LRG LVL3 (GOWN DISPOSABLE) ×2 IMPLANT
GOWN STRL REUS W/ TWL XL LVL3 (GOWN DISPOSABLE) ×2 IMPLANT
GOWN STRL REUS W/TWL LRG LVL3 (GOWN DISPOSABLE) ×6
GOWN STRL REUS W/TWL XL LVL3 (GOWN DISPOSABLE) ×6
HANDPIECE INTERPULSE COAX TIP (DISPOSABLE) ×3
HOOD PEEL AWAY FACE SHEILD DIS (HOOD) ×6 IMPLANT
IMMOBILIZER KNEE 22 UNIV (SOFTGOODS) IMPLANT
KIT BASIN OR (CUSTOM PROCEDURE TRAY) ×3 IMPLANT
KIT ROOM TURNOVER OR (KITS) ×3 IMPLANT
MANIFOLD NEPTUNE II (INSTRUMENTS) ×3 IMPLANT
MARKER SKIN DUAL TIP RULER LAB (MISCELLANEOUS) ×3 IMPLANT
NS IRRIG 1000ML POUR BTL (IV SOLUTION) ×3 IMPLANT
PACK TOTAL JOINT (CUSTOM PROCEDURE TRAY) ×3 IMPLANT
PACK UNIVERSAL I (CUSTOM PROCEDURE TRAY) ×3 IMPLANT
PAD ARMBOARD 7.5X6 YLW CONV (MISCELLANEOUS) ×6 IMPLANT
PADDING CAST COTTON 6X4 STRL (CAST SUPPLIES) ×3 IMPLANT
RUBBERBAND STERILE (MISCELLANEOUS) ×5 IMPLANT
SET HNDPC FAN SPRY TIP SCT (DISPOSABLE) ×1 IMPLANT
STRIP CLOSURE SKIN 1/2X4 (GAUZE/BANDAGES/DRESSINGS) ×2 IMPLANT
SUCTION FRAZIER TIP 10 FR DISP (SUCTIONS) ×3 IMPLANT
SUT ETHIBOND NAB CT1 #1 30IN (SUTURE) ×6 IMPLANT
SUT MNCRL AB 3-0 PS2 18 (SUTURE) ×3 IMPLANT
SUT VIC AB 0 CT1 27 (SUTURE) ×9
SUT VIC AB 0 CT1 27XBRD ANBCTR (SUTURE) ×2 IMPLANT
SUT VIC AB 2-0 CT1 27 (SUTURE) ×6
SUT VIC AB 2-0 CT1 TAPERPNT 27 (SUTURE) ×2 IMPLANT
SYR 30ML SLIP (SYRINGE) ×3 IMPLANT
TOWEL OR 17X24 6PK STRL BLUE (TOWEL DISPOSABLE) ×3 IMPLANT
TOWEL OR 17X26 10 PK STRL BLUE (TOWEL DISPOSABLE) ×3 IMPLANT
TRAY FOLEY CATH 16FR SILVER (SET/KITS/TRAYS/PACK) ×3 IMPLANT
WATER STERILE IRR 1000ML POUR (IV SOLUTION) ×6 IMPLANT

## 2015-07-23 NOTE — Op Note (Signed)
MRN:     [id number] DOB/AGE:    October 02, 1951 / 64 y.o.       OPERATIVE REPORT    DATE OF PROCEDURE:  07/23/2015       PREOPERATIVE DIAGNOSIS:   Primary localized OA right knee      Estimated body mass index is 34.98 kg/(m^2) as calculated from the following:   Height as of this encounter: 5\' 8"  (1.727 m).   Weight as of this encounter: 104.327 kg (230 lb).                                                        POSTOPERATIVE DIAGNOSIS:   same                                                                      PROCEDURE:  Procedure(s): RIGHT TOTAL KNEE ARTHROPLASTY Using Depuy Attune RP implants #7 Femur, #9Tibia, 5mm sigma RP bearing, 38 Patella     SURGEON: Lanaysia Fritchman A    ASSISTANT:  Kirstin Shepperson PA-C   (Present and scrubbed throughout the case, critical for assistance with exposure, retraction, instrumentation, and closure.)         ANESTHESIA: Spinal with Femoral Nerve Block  DRAINS: foley, 2 medium hemovac in knee   TOURNIQUET TIME:   COMPLICATIONS:  None     SPECIMENS: None   INDICATIONS FOR PROCEDURE: The patient has  djd right knee, varus deformities, XR shows bone on bone arthritis. Patient has failed all conservative measures including anti-inflammatory medicines, narcotics, attempts at  exercise and weight loss, cortisone injections and viscosupplementation.  Risks and benefits of surgery have been discussed, questions answered.   DESCRIPTION OF PROCEDURE: The patient identified by armband, received  right femoral nerve block and IV antibiotics, in the holding area at [facility]. Patient taken to the operating room, appropriate anesthetic  monitors were attached General endotracheal anesthesia induced with  the patient in supine position, Foley catheter was inserted. Tourniquet  applied high to the operative thigh. Lateral post and foot positioner  applied to the table, the lower extremity was then prepped and draped  in usual sterile fashion from  the ankle to the tourniquet. Time-out procedure was performed. The limb was wrapped with an Esmarch bandage and the tourniquet inflated to 365 mmHg. We began the operation by making the anterior midline incision starting at handbreadth above the patella going over the patella 1 cm medial to and  4 cm distal to the tibial tubercle. Small bleeders in the skin and the  subcutaneous tissue identified and cauterized. Transverse retinaculum was incised and reflected medially and a medial parapatellar arthrotomy was accomplished. the patella was everted and theprepatellar fat pad resected. The superficial medial collateral  ligament was then elevated from anterior to posterior along the proximal  flare of the tibia and anterior half of the menisci resected. The knee was hyperflexed exposing bone on bone arthritis. Peripheral and notch osteophytes as well as the cruciate ligaments were then resected. We continued to  work our way around posteriorly along the proximal tibia, and  externally  rotated the tibia subluxing it out from underneath the femur. A McHale  retractor was placed through the notch and a lateral Hohmann retractor  placed, and we then drilled through the proximal tibia in line with the  axis of the tibia followed by an intramedullary guide rod and 2-degree  posterior slope cutting guide. The tibial cutting guide was pinned into place  allowing resection of 4 mm of bone medially and about 6 mm of bone  laterally because of her varus deformity. Satisfied with the tibial resection, we then  entered the distal femur 2 mm anterior to the PCL origin with the  intramedullary guide rod and applied the distal femoral cutting guide  set at 11mm, with 5 degrees of valgus. This was pinned along the  epicondylar axis. At this point, the distal femoral cut was accomplished without difficulty. We then sized for a #7 femoral component and pinned the guide in 3 degrees of external rotation.The chamfer cutting  guide was pinned into place. The anterior, posterior, and chamfer cuts were accomplished without difficulty followed by  the Attune RP box cutting guide and the box cut. We also removed posterior osteophytes from the posterior femoral condyles. At this  time, the knee was brought into full extension. We checked our  extension and flexion gaps and found them symmetric at 5mm.  The patella thickness measured at 30 mm. We set the cutting guide at 20 and removed the posterior 9.5-10 mm  of the patella sized for 38 button and drilled the lollipop. The knee  was then once again hyperflexed exposing the proximal tibia. We sized for a #9 tibial base plate, applied the smokestack and the conical reamer followed by the the Delta fin keel punch. We then hammered into place the Attune RP trial femoral component, inserted a 5-mm trial bearing, trial patellar button, and took the knee through range of motion from 0-130 degrees. No thumb pressure was required for patellar  tracking. At this point, all trial components were removed, a double batch of DePuy HV cement with 1500 mg of Zinacef was mixed and applied to all bony metallic mating surfaces except for the posterior condyles of the femur itself. In order, we  hammered into place the tibial tray and removed excess cement, the femoral component and removed excess cement, a 5-mm Attune RP bearing  was inserted, and the knee brought to full extension with compression.  The patellar button was clamped into place, and excess cement  removed. While the cement cured the wound was irrigated out with normal saline solution pulse lavage, and medium Hemovac drains were placed.. Ligament stability and patellar tracking were checked and found to be excellent. The tourniquet was then released and hemostasis was obtained with cautery. The parapatellar arthrotomy was closed with  #1 ethibond suture. The subcutaneous tissue with 0 and 2-0 undyed  Vicryl suture, and 4-0 Monocryl.. A  dressing of Xeroform,  4 x 4, dressing sponges, Webril, and Ace wrap applied. Needle and sponge count were correct times 2.The patient awakened, extubated, and taken to recovery room without difficulty. Vascular status was normal, pulses 2+ and symmetric.   Buel Molder A 07/23/2015, 4:54 PM

## 2015-07-23 NOTE — Progress Notes (Signed)
Orthopedic Tech Progress Note Patient Details:  Lee Mclaughlin 01-Dec-1951 [id number]  CPM Right Knee CPM Right Knee: On Right Knee Flexion (Degrees): 90 Right Knee Extension (Degrees): 0 Additional Comments: trapeze bar patient helper viewedv order from doctor's order list  [name] 07/23/2015, 12:33 PM

## 2015-07-23 NOTE — Anesthesia Preprocedure Evaluation (Addendum)
Anesthesia Evaluation  Patient identified by MRN, date of birth, ID band Patient awake    Reviewed: Allergy & Precautions, NPO status , Patient's Chart, lab work & pertinent test results  History of Anesthesia Complications Negative for: history of anesthetic complications  Airway Mallampati: II  TM Distance: >3 FB Neck ROM: Full    Dental  (+) Teeth Intact, Dental Advisory Given, Caps   Pulmonary former smoker,    Pulmonary exam normal       Cardiovascular + CAD, + Past MI and + CABG Normal cardiovascular exam    Neuro/Psych negative neurological ROS  negative psych ROS   GI/Hepatic negative GI ROS, Neg liver ROS,   Endo/Other  negative endocrine ROS  Renal/GU negative Renal ROS     Musculoskeletal   Abdominal   Peds  Hematology   Anesthesia Other Findings   Reproductive/Obstetrics                            Anesthesia Physical Anesthesia Plan  ASA: III  Anesthesia Plan: MAC and Spinal   Post-op Pain Management: MAC Combined w/ Regional for Post-op pain   Induction: Intravenous  Airway Management Planned: Simple Face Mask  Additional Equipment:   Intra-op Plan:   Post-operative Plan:   Informed Consent: I have reviewed the patients History and Physical, chart, labs and discussed the procedure including the risks, benefits and alternatives for the proposed anesthesia with the patient or authorized representative who has indicated his/her understanding and acceptance.   Dental advisory given  Plan Discussed with: CRNA, Anesthesiologist and Surgeon  Anesthesia Plan Comments:         Anesthesia Quick Evaluation

## 2015-07-23 NOTE — Anesthesia Procedure Notes (Addendum)
Anesthesia Regional Block:  Adductor canal block  Pre-Anesthetic Checklist: ,, timeout performed, Correct Patient, Correct Site, Correct Laterality, Correct Procedure, Correct Position, site marked, Risks and benefits discussed,  Surgical consent,  Pre-op evaluation,  At surgeon's request and post-op pain management  Laterality: Right  Prep: chloraprep       Needles:  Injection technique: Single-shot  Needle Type: Stimulator Needle - 80     Needle Length: 10cm 10 cm Needle Gauge: 21 and 21 G    Additional Needles:  Procedures: ultrasound guided (picture in chart) Adductor canal block Narrative:  Start time: 07/23/2015 8:28 AM End time: 07/23/2015 8:38 AM Injection made incrementally with aspirations every 5 mL.  Performed by: Personally    Spinal Patient location during procedure: OR Staffing Anesthesiologist: Marye Round Performed by: anesthesiologist  Preanesthetic Checklist Completed: patient identified, site marked, surgical consent, pre-op evaluation, timeout performed, IV checked, risks and benefits discussed and monitors and equipment checked Spinal Block Patient position: sitting Prep: Betadine Patient monitoring: heart rate, continuous pulse ox, blood pressure and cardiac monitor Approach: midline Location: L4-5 Injection technique: single-shot Needle Needle type: Whitacre and Introducer  Needle gauge: 24 G Needle length: 9 cm Additional Notes Negative paresthesia. Negative blood return. Positive free-flowing CSF. Expiration date of kit checked and confirmed. Patient tolerated procedure well, without complications.

## 2015-07-23 NOTE — Anesthesia Postprocedure Evaluation (Signed)
Anesthesia Post Note  Patient: Lee Mclaughlin  Procedure(s) Performed: Procedure(s) (LRB): RIGHT TOTAL KNEE ARTHROPLASTY (Right)  Anesthesia type: MAC/SAB  Patient location: PACU  Post pain: Pain level controlled  Post assessment: Patient's Cardiovascular Status Stable, SAB receding  Last Vitals:  Filed Vitals:   07/23/15 1245  BP: 103/54  Pulse: 52  Temp:   Resp:     Post vital signs: Reviewed and stable  Level of consciousness: sedated  Complications: No apparent anesthesia complications

## 2015-07-23 NOTE — Transfer of Care (Signed)
Immediate Anesthesia Transfer of Care Note  Patient: Lee Mclaughlin  Procedure(s) Performed: Procedure(s): RIGHT TOTAL KNEE ARTHROPLASTY (Right)  Patient Location: PACU  Anesthesia Type:Spinal  Level of Consciousness: awake, alert  and oriented  Airway & Oxygen Therapy: Patient Spontanous Breathing and Patient connected to face mask oxygen  Post-op Assessment: Report given to RN and Post -op Vital signs reviewed and stable  Post vital signs: Reviewed and stable  Last Vitals:  Filed Vitals:   07/23/15 0840  BP:   Pulse: 66  Temp:   Resp: 16    Complications: No apparent anesthesia complications

## 2015-07-23 NOTE — Interval H&P Note (Signed)
History and Physical Interval Note:  07/23/2015 9:20 AM  Lee Mclaughlin  has presented today for surgery, with the diagnosis of primary localized OA right knee  The various methods of treatment have been discussed with the patient and family. After consideration of risks, benefits and other options for treatment, the patient has consented to  Procedure(s): RIGHT TOTAL KNEE ARTHROPLASTY (Right) as a surgical intervention .  The patient's history has been reviewed, patient examined, no change in status, stable for surgery.  I have reviewed the patient's chart and labs.  Questions were answered to the patient's satisfaction.     Salvatore Marvel A

## 2015-07-24 ENCOUNTER — Encounter (HOSPITAL_COMMUNITY): Payer: Self-pay | Admitting: Orthopedic Surgery

## 2015-07-24 LAB — CBC
HCT: 35.1 % — ABNORMAL LOW (ref 39.0–52.0)
HEMOGLOBIN: 11.9 g/dL — AB (ref 13.0–17.0)
MCH: 30.4 pg (ref 26.0–34.0)
MCHC: 33.9 g/dL (ref 30.0–36.0)
MCV: 89.5 fL (ref 78.0–100.0)
Platelets: 142 10*3/uL — ABNORMAL LOW (ref 150–400)
RBC: 3.92 MIL/uL — AB (ref 4.22–5.81)
RDW: 12.7 % (ref 11.5–15.5)
WBC: 12.4 10*3/uL — ABNORMAL HIGH (ref 4.0–10.5)

## 2015-07-24 LAB — BASIC METABOLIC PANEL
ANION GAP: 9 (ref 5–15)
Anion gap: 6 (ref 5–15)
BUN: 23 mg/dL — AB (ref 6–20)
BUN: 24 mg/dL — AB (ref 6–20)
CHLORIDE: 105 mmol/L (ref 101–111)
CHLORIDE: 108 mmol/L (ref 101–111)
CO2: 23 mmol/L (ref 22–32)
CO2: 23 mmol/L (ref 22–32)
Calcium: 8.3 mg/dL — ABNORMAL LOW (ref 8.9–10.3)
Calcium: 8.6 mg/dL — ABNORMAL LOW (ref 8.9–10.3)
Creatinine, Ser: 1.13 mg/dL (ref 0.61–1.24)
Creatinine, Ser: 1.05 mg/dL (ref 0.61–1.24)
GFR calc Af Amer: >60 mL/min (ref >60)
GFR calc Af Amer: >60 mL/min (ref >60)
GFR calc non Af Amer: >60 mL/min (ref >60)
GFR calc non Af Amer: >60 mL/min (ref >60)
Glucose, Bld: 170 mg/dL — ABNORMAL HIGH (ref 65–99)
Glucose, Bld: 126 mg/dL — ABNORMAL HIGH (ref 65–99)
POTASSIUM: 5.9 mmol/L — AB (ref 3.5–5.1)
POTASSIUM: 4 mmol/L (ref 3.5–5.1)
SODIUM: 134 mmol/L — AB (ref 135–145)
SODIUM: 140 mmol/L (ref 135–145)

## 2015-07-24 MED ORDER — DEXAMETHASONE 4 MG PO TABS
10.0000 mg | ORAL_TABLET | Freq: Three times a day (TID) | ORAL | Status: DC
  Administered 2015-07-24: 10 mg via ORAL
  Filled 2015-07-24 (×2): qty 3

## 2015-07-24 MED ORDER — LORAZEPAM 0.5 MG PO TABS
0.5000 mg | ORAL_TABLET | Freq: Four times a day (QID) | ORAL | Status: DC | PRN
  Filled 2015-07-24: qty 1

## 2015-07-24 MED ORDER — SODIUM CHLORIDE 0.9 % IV SOLN
INTRAVENOUS | Status: AC
  Administered 2015-07-24: 100 mL/h via INTRAVENOUS

## 2015-07-24 MED ORDER — FUROSEMIDE 10 MG/ML IJ SOLN
10.0000 mg | Freq: Once | INTRAMUSCULAR | Status: AC
Start: 2015-07-24 — End: 2015-07-24
  Administered 2015-07-24: 10 mg via INTRAVENOUS
  Filled 2015-07-24: qty 2

## 2015-07-24 NOTE — Progress Notes (Signed)
Utilization review completed.  

## 2015-07-24 NOTE — Care Management Note (Signed)
Case Management Note  Patient Details  Name: Lee Mclaughlin MRN: [id number] Date of Birth: 02-Jul-1951  Subjective/Objective:            S/p right total knee athroplasty        Action/Plan: Set up with [facility] for HHPT by MD office. Spoke with patient and his wife, no change in discharge plan. [employer] delivered CPM and 3N1 to patient's home, patient already had rolling walker. Patient states that his wife will be available to assist after discharge.   Expected Discharge Date:                  Expected Discharge Plan:  Home w Home Health Services  In-House Referral:  NA  Discharge planning Services  CM Consult  Post Acute Care Choice:  Durable Medical Equipment, Home Health Choice offered to:  Patient  DME Arranged:  3-N-1, CPM DME Agency:  [employer]  HH Arranged:  PT HH Agency:  [employer] [employer] [facility]  Status of Service:  Completed, signed off  Medicare Important Message Given:    Date Medicare IM Given:    Medicare IM give by:    Date Additional Medicare IM Given:    Additional Medicare Important Message give by:     If discussed at Long Length of Stay Meetings, dates discussed:    Additional Comments:  Monica Becton, RN 07/24/2015, 3:19 PM

## 2015-07-24 NOTE — Progress Notes (Signed)
Subjective: 1 Day Post-Op Procedure(s) (LRB): RIGHT TOTAL KNEE ARTHROPLASTY (Right) Patient reports pain as 4 on 0-10 scale.    Objective: Vital signs in last 24 hours: Temp:  [97.2 F (36.2 C)-98 F (36.7 C)] 97.5 F (36.4 C) (08/16 0639) Pulse Rate:  [52-84] 75 (08/16 0639) Resp:  [7-20] 18 (08/16 0639) BP: (95-137)/(53-99) 133/82 mmHg (08/16 0639) SpO2:  [97 %-100 %] 100 % (08/16 0639) Weight:  [104.327 kg (230 lb)-104.582 kg (230 lb 9 oz)] 104.327 kg (230 lb) (08/15 0749)  Intake/Output from previous day: 08/15 0701 - 08/16 0700 In: 1038.3 [I.V.:1038.3] Out: 1925 [Urine:1350; Drains:575] Intake/Output this shift:     Recent Labs  07/24/15 0405  HGB 11.9*    Recent Labs  07/24/15 0405  WBC 12.4*  RBC 3.92*  HCT 35.1*  PLT 142*   No results for input(s): NA, K, CL, CO2, BUN, CREATININE, GLUCOSE, CALCIUM in the last 72 hours. No results for input(s): LABPT, INR in the last 72 hours.  ABD soft Neurovascular intact Sensation intact distally Intact pulses distally Incision: dressing C/D/I  Assessment/Plan: 1 Day Post-Op Procedure(s) (LRB): RIGHT TOTAL KNEE ARTHROPLASTY (Right)  Principal Problem:   Primary localized osteoarthritis of right knee Active Problems:   CAD (coronary artery disease)   S/P CABG x 4   Dyslipidemia   Cardiomyopathy, ischemic   Statin intolerance   Staphylococcal arthritis of right knee   DJD (degenerative joint disease) of knee  Advance diet Up with therapy D/C IV fluids Discharge home with home health  Annalycia Done J 07/24/2015, 7:03 AM

## 2015-07-24 NOTE — Progress Notes (Signed)
Physical Therapy Treatment Patient Details Name: Lee Mclaughlin MRN: [id number] DOB: Nov 30, 1951 Today's Date: 07/24/2015    History of Present Illness Pt is a 64 y/o M s/p Rt TKA.  Pt's PMH includes CAD, cervical spine surgery, and MI.    PT Comments    Pt does not have a good awareness of his deficits.  When discussing his high risk for fall with him he says, "I'll be fine, I will catch myself, nothing will happen".  However pt continues to demonstrate knee buckle during ambulation.  Education provided on importance of safety and from a mobility standpoint pt not yet safe to return home.  Anticipate that after two PT sessions tomorrow pt may be ready.  Pt will benefit from continued skilled PT services to increase functional independence and safety.   Follow Up Recommendations  Home health PT;Supervision for mobility/OOB     Equipment Recommendations  None recommended by PT    Recommendations for Other Services       Precautions / Restrictions Precautions Precautions: Fall;Knee Precaution Booklet Issued: Yes (comment) Precaution Comments: Reviewed knee precautions.  Pt does not have a good awareness of his deficits.  When discussing his high risk for fall with him he say, "I'll be fine, I will catch myself, nothing will happen". Required Braces or Orthoses: Knee Immobilizer - Right Knee Immobilizer - Right: Discontinue once straight leg raise with < 10 degree lag Restrictions Weight Bearing Restrictions: Yes RLE Weight Bearing: Weight bearing as tolerated    Mobility  Bed Mobility Overal bed mobility: Modified Independent             General bed mobility comments: Mod I.  Cues for proper breathing technique as pt demonstrates valsalva maneuver.  Transfers Overall transfer level: Needs assistance Equipment used: Rolling walker (2 wheeled) Transfers: Sit to/from Stand Sit to Stand: Supervision         General transfer comment: Supervision for safety.  Pt w/ good  technique and did not require any physical assist.  Ambulation/Gait Ambulation/Gait assistance: Min guard Ambulation Distance (Feet): 300 Feet Assistive device: Rolling walker (2 wheeled) Gait Pattern/deviations: Step-through pattern;Antalgic;Decreased weight shift to right;Decreased stride length;Decreased stance time - right   Gait velocity interpretation: Below normal speed for age/gender General Gait Details: Knee buckle x3 while ambulating but pt was able to remain stable as he was wearing the Rt KI.  Cues to keep Rt leg straight and tighten quad to prevent knee buckle; however, pt continues to say he like to "challenge myself to see how good I am doing" by attempting to flex Rt knee at terminal stance.     Stairs            Wheelchair Mobility    Modified Rankin (Stroke Patients Only)       Balance Overall balance assessment: Needs assistance Sitting-balance support: No upper extremity supported;Feet supported Sitting balance-Leahy Scale: Good     Standing balance support: Bilateral upper extremity supported;During functional activity Standing balance-Leahy Scale: Poor Standing balance comment: Requires support from Rt KI and RW to prevent knee buckle and fall                    Cognition Arousal/Alertness: Awake/alert Behavior During Therapy: WFL for tasks assessed/performed Overall Cognitive Status: Within Functional Limits for tasks assessed                      Exercises Total Joint Exercises Ankle Circles/Pumps: AROM;Both;15 reps;Supine Quad Sets: Strengthening;Both;10  reps;Supine Short Arc Quad: AAROM;Right;5 reps;Supine Heel Slides: AROM;Right;5 reps;Supine Straight Leg Raises: AAROM;Right;5 reps;Supine Knee Flexion: Right;AAROM;AROM;5 reps;Seated Goniometric ROM: 13-98    General Comments        Pertinent Vitals/Pain Pain Assessment: 0-10 Pain Score: 2  Pain Location: Rt knee Pain Descriptors / Indicators: Aching Pain  Intervention(s): Limited activity within patient's tolerance;Monitored during session;Repositioned    Home Living Family/patient expects to be discharged to:: Private residence Living Arrangements: Spouse/significant other Available Help at Discharge: Family;Friend(s);Available 24 hours/day Type of Home: House Home Access: Level entry   Home Layout: Two level;Able to live on main level with bedroom/bathroom Home Equipment: Dan Humphreys - 2 wheels;Bedside commode;Cane - single point      Prior Function Level of Independence: Independent          PT Goals (current goals can now be found in the care plan section) Acute Rehab PT Goals Patient Stated Goal: to go home PT Goal Formulation: With patient Time For Goal Achievement: 07/31/15 Potential to Achieve Goals: Good Progress towards PT goals: Progressing toward goals    Frequency  7X/week    PT Plan Current plan remains appropriate    Co-evaluation             End of Session Equipment Utilized During Treatment: Gait belt;Right knee immobilizer Activity Tolerance: Patient tolerated treatment well Patient left: in chair;with call bell/phone within reach;with family/visitor present     Time: 1429-1500 PT Time Calculation (min) (ACUTE ONLY): 31 min  Charges:  $Gait Training: 8-22 mins $Therapeutic Exercise: 8-22 mins                    G Codes:      Michail Jewels PT, DPT [phone] Pager: [phone] 07/24/2015, 3:13 PM

## 2015-07-24 NOTE — Evaluation (Signed)
Occupational Therapy Evaluation Patient Details Name: Lee Mclaughlin MRN: [id number] DOB: 1951/09/16 Today's Date: 2015-07-31    History of Present Illness RTKA   Clinical Impression   Pt is s/p TKA .Education complete from OT standpoint.  Follow Up Recommendations  No OT follow up    Equipment Recommendations  None recommended by OT    Recommendations for Other Services       Precautions / Restrictions Precautions Precautions: None Required Braces or Orthoses: Knee Immobilizer - Right Knee Immobilizer - Right: Discontinue once straight leg raise with < 10 degree lag Restrictions Weight Bearing Restrictions: Yes RLE Weight Bearing: Weight bearing as tolerated      Mobility Bed Mobility               General bed mobility comments: pt in chair  Transfers Overall transfer level: Needs assistance Equipment used: Rolling walker (2 wheeled) Transfers: Sit to/from Stand Sit to Stand: Min guard                   ADL Overall ADL's : Needs assistance/impaired                                       General ADL Comments: Pt overall S- minA and will have A at home as needed. Education complete regarding ADL activity s/p TKR.  Pt has all needed DME.                 Pertinent Vitals/Pain Pain Assessment: 0-10 Pain Score: 3  Pain Location: r knee Pain Descriptors / Indicators: Sore Pain Intervention(s): Monitored during session        Extremity/Trunk Assessment Upper Extremity Assessment Upper Extremity Assessment: Overall WFL for tasks assessed           Communication Communication Communication: No difficulties   Cognition Arousal/Alertness: Awake/alert Behavior During Therapy: WFL for tasks assessed/performed Overall Cognitive Status: Within Functional Limits for tasks assessed                                Home Living Family/patient expects to be discharged to:: Private residence Living Arrangements:  Spouse/significant other Available Help at Discharge: Family;Friend(s);Available 24 hours/day Type of Home: House Home Access: Level entry     Home Layout: Two level;Able to live on main level with bedroom/bathroom     Bathroom Shower/Tub: Walk-in shower   Bathroom Toilet: Handicapped height     Home Equipment: Environmental consultant - 2 wheels;Bedside commode;Cane - single point          Prior Functioning/Environment Level of Independence: Independent                      OT Goals(Current goals can be found in the care plan section) Acute Rehab OT Goals Patient Stated Goal: home tomorrow OT Goal Formulation: With patient  OT Frequency:                End of Session CPM Right Knee CPM Right Knee: Off  Activity Tolerance:  good Patient left:  in chair with PT present   Time: 1105-1146 OT Time Calculation (min): 41 min Charges:  OT General Charges $OT Visit: 1 Procedure OT Evaluation $Initial OT Evaluation Tier I: 1 Procedure OT Treatments $Self Care/Home Management : 8-22 mins G-Codes:    Einar Crow D Jul 31, 2015, 12:06 PM

## 2015-07-24 NOTE — Plan of Care (Signed)
Problem: Consults Goal: Diagnosis- Total Joint Replacement Outcome: Completed/Met Date Met:  07/24/15 Primary Total Knee Right

## 2015-07-24 NOTE — Progress Notes (Signed)
PT Treatment Note   07/24/15 1146  PT Visit Information  Last PT Received On 07/24/15  Assistance Needed +1  History of Present Illness Pt is a 64 y/o M s/p Rt TKA.  Pt's PMH includes CAD, cervical spine surgery, and MI.  Precautions  Precautions Fall;Knee  Precaution Booklet Issued Yes (comment)  Precaution Comments Reviewed knee precautions  Required Braces or Orthoses Knee Immobilizer - Right  Knee Immobilizer - Right Discontinue once straight leg raise with < 10 degree lag  Restrictions  Weight Bearing Restrictions Yes  RLE Weight Bearing WBAT  Home Living  Family/patient expects to be discharged to: Private residence  Living Arrangements Spouse/significant other  Available Help at Discharge Family;Friend(s);Available 24 hours/day  Type of Home House  Home Access Level entry  Home Layout Two level;Able to live on main level with bedroom/bathroom  Home Equipment Walker - 2 wheels;BSC;Cane - single point  Prior Function  Level of Independence Independent  Communication  Communication No difficulties  Pain Assessment  Pain Assessment No/denies pain  Pain Score 0  Pain Intervention(s) Limited activity within patient's tolerance;Monitored during session  Cognition  Arousal/Alertness Awake/alert  Behavior During Therapy WFL for tasks assessed/performed  Overall Cognitive Status Within Functional Limits for tasks assessed  Upper Extremity Assessment  Upper Extremity Assessment Defer to OT evaluation  Lower Extremity Assessment  Lower Extremity Assessment RLE deficits/detail  RLE Deficits / Details weakness and ROM limitations as expected s/p Rt TKA  Bed Mobility  General bed mobility comments in recliner upon PT arrival  Transfers  Overall transfer level Needs assistance  Equipment used Rolling walker (2 wheeled)  Transfers Sit to/from Stand  Sit to Stand Supervision  General transfer comment Supervision for safety.  Pt w/ good technique and did not require any physical  assist.  Ambulation/Gait  Ambulation/Gait assistance Min guard  Ambulation Distance (Feet) 350 Feet  Assistive device Rolling walker (2 wheeled)  Gait Pattern/deviations Step-through pattern;Antalgic;Decreased weight shift to right;Decreased stride length;Decreased stance time - right  General Gait Details Knee buckle x2 while ambulating but pt was able to remain stable as he was wearing the Rt KI.  Cues to keep Rt leg straight and tighten quad to prevent knee buckle which pt reported made him feel more stable.  Gait velocity interpretation Below normal speed for age/gender  Balance  Overall balance assessment Needs assistance  Sitting-balance support No upper extremity supported;Feet supported  Sitting balance-Leahy Scale Good  Standing balance support Bilateral upper extremity supported;During functional activity  Standing balance-Leahy Scale Poor  Standing balance comment Need support from Rt KI and RW to prevent knee buckle  Exercises  Exercises Total Joint  Total Joint Exercises  Ankle Circles/Pumps AROM;Both;15 reps;Seated  Quad Sets Strengthening;Both;10 reps;Seated  Heel Slides AROM;Right;5 reps;Seated  Straight Leg Raises AAROM;Right;5 reps;Seated  Knee Flexion Right;AAROM;AROM;5 reps;Seated  Goniometric ROM 13-98  PT - End of Session  Equipment Utilized During Treatment Gait belt;Right knee immobilizer  Activity Tolerance Patient tolerated treatment well  Patient left in chair;with call bell/phone within reach  Nurse Communication Mobility status;Precautions;Weight bearing status  PT Assessment  PT Therapy Diagnosis  Difficulty walking;Abnormality of gait;Generalized weakness;Acute pain  PT Recommendation/Assessment Patient needs continued PT services  PT Problem List Decreased strength;Decreased range of motion;Decreased activity tolerance;Decreased balance;Decreased mobility;Decreased coordination;Decreased knowledge of use of DME;Decreased safety awareness;Decreased  knowledge of precautions;Decreased skin integrity;Pain  PT Plan  PT Frequency (ACUTE ONLY) 7X/week  PT Treatment/Interventions (ACUTE ONLY) DME instruction;Gait training;Stair training;Therapeutic activities;Functional mobility training;Therapeutic exercise;Balance training;Neuromuscular re-education;Patient/family education;Modalities  PT Recommendation  Follow Up Recommendations Home health PT;Supervision for mobility/OOB  PT equipment None recommended by PT  Individuals Consulted  Consulted and Agree with Results and Recommendations Patient  Acute Rehab PT Goals  Patient Stated Goal pt wants to go home today  PT Goal Formulation With patient  Time For Goal Achievement 07/31/15  Potential to Achieve Goals Good  PT Time Calculation  PT Start Time (ACUTE ONLY) 1142  PT Stop Time (ACUTE ONLY) 1213  PT Time Calculation (min) (ACUTE ONLY) 31 min  PT General Charges  $$ ACUTE PT VISIT 1 Procedure  PT Evaluation  $Initial PT Evaluation Tier I 1 Procedure  PT Treatments  $Gait Training 8-22 mins    Assessment: Pt is s/p Rt TKA resulting in the deficits listed below (see PT Problem List). Mr. Panepinto demonstrated ability to ambulate in hallway; however Rt KI was applied prior to ambulation as pt is unable to perform SLR w/o ext lag.  Pt w/ knee buckle x2 during ambulation this session. Pt will benefit from skilled PT to increase their independence and safety with mobility to allow discharge to the venue listed below.   Michail Jewels PT, DPT [phone] Pager: [phone]

## 2015-07-24 NOTE — Discharge Instructions (Signed)
Information on my medicine - ELIQUIS® (apixaban) ° °This medication education was reviewed with me or my healthcare representative as part of my discharge preparation.  The pharmacist that spoke with me during my hospital stay was:  Kendy Haston, RPH ° °Why was Eliquis® prescribed for you? °Eliquis® was prescribed for you to reduce the risk of blood clots forming after orthopedic surgery.   ° °What do You need to know about Eliquis®? °Take your Eliquis® TWICE DAILY - one tablet in the morning and one tablet in the evening with or without food.  It would be best to take the dose about the same time each day. ° °If you have difficulty swallowing the tablet whole please discuss with your pharmacist how to take the medication safely. ° °Take Eliquis® exactly as prescribed by your doctor and DO NOT stop taking Eliquis® without talking to the doctor who prescribed the medication.  Stopping without other medication to take the place of Eliquis® may increase your risk of developing a clot. ° °After discharge, you should have regular check-up appointments with your healthcare provider that is prescribing your Eliquis®. ° °What do you do if you miss a dose? °If a dose of ELIQUIS® is not taken at the scheduled time, take it as soon as possible on the same day and twice-daily administration should be resumed.  The dose should not be doubled to make up for a missed dose.  Do not take more than one tablet of ELIQUIS at the same time. ° °Important Safety Information °A possible side effect of Eliquis® is bleeding. You should call your healthcare provider right away if you experience any of the following: °? Bleeding from an injury or your nose that does not stop. °? Unusual colored urine (red or dark brown) or unusual colored stools (red or black). °? Unusual bruising for unknown reasons. °? A serious fall or if you hit your head (even if there is no bleeding). ° °Some medicines may interact with Eliquis® and might increase your  risk of bleeding or clotting while on Eliquis®. To help avoid this, consult your healthcare provider or pharmacist prior to using any new prescription or non-prescription medications, including herbals, vitamins, non-steroidal anti-inflammatory drugs (NSAIDs) and supplements. ° °This website has more information on Eliquis® (apixaban): http://www.eliquis.com/eliquis/home ° °

## 2015-07-25 LAB — BASIC METABOLIC PANEL
ANION GAP: 7 (ref 5–15)
BUN: 24 mg/dL — ABNORMAL HIGH (ref 6–20)
CHLORIDE: 98 mmol/L — AB (ref 101–111)
CO2: 27 mmol/L (ref 22–32)
Calcium: 8.9 mg/dL (ref 8.9–10.3)
Creatinine, Ser: 1.1 mg/dL (ref 0.61–1.24)
GFR calc Af Amer: >60 mL/min (ref >60)
GFR calc non Af Amer: >60 mL/min (ref >60)
GLUCOSE: 145 mg/dL — AB (ref 65–99)
POTASSIUM: 4.4 mmol/L (ref 3.5–5.1)
Sodium: 132 mmol/L — ABNORMAL LOW (ref 135–145)

## 2015-07-25 LAB — CBC
HEMATOCRIT: 33 % — AB (ref 39.0–52.0)
HEMOGLOBIN: 11.3 g/dL — AB (ref 13.0–17.0)
MCH: 30.6 pg (ref 26.0–34.0)
MCHC: 34.2 g/dL (ref 30.0–36.0)
MCV: 89.4 fL (ref 78.0–100.0)
Platelets: 150 10*3/uL (ref 150–400)
RBC: 3.69 MIL/uL — AB (ref 4.22–5.81)
RDW: 12.8 % (ref 11.5–15.5)
WBC: 11.1 10*3/uL — AB (ref 4.0–10.5)

## 2015-07-25 MED ORDER — OXYCODONE HCL ER 20 MG PO T12A
20.0000 mg | EXTENDED_RELEASE_TABLET | Freq: Two times a day (BID) | ORAL | Status: DC
Start: 2015-07-25 — End: 2015-12-20

## 2015-07-25 MED ORDER — APIXABAN 2.5 MG PO TABS: ORAL_TABLET | ORAL | Status: DC

## 2015-07-25 MED ORDER — DOCUSATE SODIUM 100 MG PO CAPS: ORAL_CAPSULE | ORAL | Status: DC

## 2015-07-25 MED ORDER — POLYETHYLENE GLYCOL 3350 17 G PO PACK: PACK | ORAL | Status: DC

## 2015-07-25 MED ORDER — LORAZEPAM 0.5 MG PO TABS: 0.5000 mg | ORAL_TABLET | Freq: Three times a day (TID) | ORAL | Status: DC | PRN

## 2015-07-25 MED ORDER — OXYCODONE HCL 5 MG PO TABS: ORAL_TABLET | ORAL | Status: DC

## 2015-07-25 NOTE — Progress Notes (Signed)
Physical Therapy Treatment Patient Details Name: Lee Mclaughlin MRN: [id number] DOB: July 14, 1951 Today's Date: 07/25/2015    History of Present Illness Pt is a 64 y/o M s/p Rt TKA.  Pt's PMH includes CAD, cervical spine surgery, and MI.    PT Comments    Mr. [place] demonstrated increased recruitment of Rt quad this session w/ his exercises which translated well into his ambulatory activity.  Rt KI continued this session; however, pt w/o knee buckle.  Pt will need to continue using Rt KI upon d/c but is progressing well.  Pt is appropriate for d/c from a mobility standpoint.    Follow Up Recommendations  Home health PT;Supervision for mobility/OOB     Equipment Recommendations  None recommended by PT    Recommendations for Other Services       Precautions / Restrictions Precautions Precautions: Fall;Knee Precaution Comments: Reviewed knee precautions.  Pt continues to not have a good awareness of his deficits.  Required Braces or Orthoses: Knee Immobilizer - Right Knee Immobilizer - Right: Discontinue once straight leg raise with < 10 degree lag Restrictions Weight Bearing Restrictions: Yes RLE Weight Bearing: Weight bearing as tolerated    Mobility  Bed Mobility               General bed mobility comments: Pt in recliner upon PT arrival  Transfers Overall transfer level: Needs assistance Equipment used: Rolling walker (2 wheeled) Transfers: Sit to/from Stand Sit to Stand: Modified independent (Device/Increase time)         General transfer comment: No verbal cues needed.  Pt w/ safe technique.  Ambulation/Gait Ambulation/Gait assistance: Modified independent (Device/Increase time) Ambulation Distance (Feet): 600 Feet Assistive device: Rolling walker (2 wheeled) Gait Pattern/deviations: Step-through pattern;Antalgic;Decreased stride length;Decreased stance time - right   Gait velocity interpretation: Below normal speed for age/gender General Gait Details:  Knee buckle x3 while ambulating but pt was able to remain stable as he was wearing the Rt KI.  Cues to keep Rt leg straight and tighten quad to prevent knee buckle; however, pt continues to say he like to "challenge myself to see how good I am doing" by attempting to flex Rt knee at terminal stance.     Stairs Stairs: Yes Stairs assistance: Supervision Stair Management: No rails;One rail Left;Step to pattern;Sideways;Backwards;With walker Number of Stairs: 4 General stair comments: 2 steps backwards using RW and 2 steps sideways using Lt rail.  Cues for proper technique.  Wheelchair Mobility    Modified Rankin (Stroke Patients Only)       Balance Overall balance assessment: Needs assistance Sitting-balance support: No upper extremity supported;Feet supported Sitting balance-Leahy Scale: Good     Standing balance support: Bilateral upper extremity supported;No upper extremity supported Standing balance-Leahy Scale: Fair Standing balance comment: Pt able to stand w/o either UE supported                    Cognition Arousal/Alertness: Awake/alert Behavior During Therapy: WFL for tasks assessed/performed Overall Cognitive Status: Within Functional Limits for tasks assessed                      Exercises Total Joint Exercises Quad Sets: Strengthening;Both;10 reps;Seated Straight Leg Raises: Right;5 reps;Strengthening;Seated Long Arc Quad: Strengthening;Right;10 reps;Seated Knee Flexion: AROM;Right;10 reps;Seated Goniometric ROM: 8-95    General Comments        Pertinent Vitals/Pain Pain Assessment: 0-10 Pain Score: 4  Pain Location: Rt knee Pain Descriptors / Indicators: Sore Pain Intervention(s): Limited  activity within patient's tolerance;Monitored during session;Repositioned    Home Living                      Prior Function            PT Goals (current goals can now be found in the care plan section) Acute Rehab PT Goals Patient  Stated Goal: to go home today PT Goal Formulation: With patient Time For Goal Achievement: 07/31/15 Potential to Achieve Goals: Good Progress towards PT goals: Progressing toward goals    Frequency  7X/week    PT Plan Current plan remains appropriate    Co-evaluation             End of Session Equipment Utilized During Treatment: Gait belt;Right knee immobilizer Activity Tolerance: Patient tolerated treatment well Patient left: in chair;with call bell/phone within reach     Time: 1610-9604 PT Time Calculation (min) (ACUTE ONLY): 24 min  Charges:  $Gait Training: 8-22 mins $Therapeutic Exercise: 8-22 mins                    G Codes:      Michail Jewels PT, DPT [phone] Pager: [phone] 07/25/2015, 10:15 AM

## 2015-07-25 NOTE — Discharge Summary (Signed)
Patient ID: Lee Mclaughlin MRN: [id number] DOB/AGE: 1951-02-21 64 y.o.  Admit date: 07/23/2015 Discharge date: 07/25/2015  Admission Diagnoses:  Principal Problem:   Primary localized osteoarthritis of right knee Active Problems:   CAD (coronary artery disease)   S/P CABG x 4   Dyslipidemia   Cardiomyopathy, ischemic   Statin intolerance   Staphylococcal arthritis of right knee   DJD (degenerative joint disease) of knee   Discharge Diagnoses:  Same  Past Medical History  Diagnosis Date  . Coronary artery disease   . S/P CABG (coronary artery bypass graft) 2000  . H/O cardiovascular stress test 10/19/2012    MET test - good effort, peak VO2 of almost 112% predicted, HR 79% predicted, mildly ischemia at end of exercise  . Ischemic cardiomyopathy     history of   . Dyslipidemia   . History of nuclear stress test 03/2011    bruce myoview; mild perfusion defect in mid anteroseptal, apical septal, apical regions (infarct/scar/breast attenuation); post-stress EF 55%; no signficant ischemia  . Primary localized osteoarthritis of right knee   . Staphylococcal arthritis of right knee 2004  . PONV (postoperative nausea and vomiting)   . Wears glasses   . Myocardial infarction   . History of hyperthyroidism as a child     Surgeries: Procedure(s): RIGHT TOTAL KNEE ARTHROPLASTY on 07/23/2015   Consultants:    Discharged Condition: Improved  Hospital Course: KAIPO ARDIS is an 64 y.o. male who was admitted 07/23/2015 for operative treatment ofPrimary localized osteoarthritis of right knee. Patient has severe unremitting pain that affects sleep, daily activities, and work/hobbies. After pre-op clearance the patient was taken to the operating room on 07/23/2015 and underwent  Procedure(s): RIGHT TOTAL KNEE ARTHROPLASTY.    Patient was given perioperative antibiotics: Anti-infectives    Start     Dose/Rate Route Frequency Ordered Stop   07/23/15 1545  ceFAZolin (ANCEF) IVPB 2 g/50 mL  premix     2 g 100 mL/hr over 30 Minutes Intravenous Every 6 hours 07/23/15 1543 07/23/15 2312   07/23/15 1002  cefUROXime (ZINACEF) injection  Status:  Discontinued       As needed 07/23/15 1002 07/23/15 1202   07/23/15 0930  ceFAZolin (ANCEF) 3 g in dextrose 5 % 50 mL IVPB     3 g 160 mL/hr over 30 Minutes Intravenous To Surgery 07/20/15 1156 07/23/15 0940       Patient was given sequential compression devices, early ambulation, and chemoprophylaxis to prevent DVT.  Patient benefited maximally from hospital stay and there were no complications.    Recent vital signs: Patient Vitals for the past 24 hrs:  BP Temp Temp src Pulse Resp SpO2  07/25/15 0355 119/65 mmHg 97.7 F (36.5 C) Oral 76 18 98 %  07/24/15 1954 128/78 mmHg 97.8 F (36.6 C) - 78 18 99 %  07/24/15 1304 (!) 147/72 mmHg 98.1 F (36.7 C) - 79 18 99 %     Recent laboratory studies:  Recent Labs  07/24/15 0405 07/24/15 1216 07/25/15 0624  WBC 12.4*  --  11.1*  HGB 11.9*  --  11.3*  HCT 35.1*  --  33.0*  PLT 142*  --  150  NA 134* 140 132*  K 5.9* 4.0 4.4  CL 105 108 98*  CO2 '23 23 27  ' BUN 23* 24* 24*  CREATININE 1.13 1.05 1.10  GLUCOSE 170* 126* 145*  CALCIUM 8.3* 8.6* 8.9     Discharge Medications:     Medication  List    STOP taking these medications        aspirin 81 MG tablet     diphenhydrAMINE 25 MG tablet  Commonly known as:  BENADRYL     fish oil-omega-3 fatty acids 1000 MG capsule     TESTOSTERONE IM     traMADol 50 MG tablet  Commonly known as:  ULTRAM      TAKE these medications        apixaban 2.5 MG Tabs tablet  Commonly known as:  ELIQUIS  1 pill every 12 hrs for two weeks to prevent blood clots.     carvedilol 12.5 MG tablet  Commonly known as:  COREG  Take 12.5 mg by mouth 2 (two) times daily with a meal.     docusate sodium 100 MG capsule  Commonly known as:  COLACE  1 tab 2 times a day while on narcotics.  STOOL SOFTENER     fenofibrate 54 MG tablet  Take 54 mg  by mouth daily.     LORazepam 0.5 MG tablet  Commonly known as:  ATIVAN  Take 1 tablet (0.5 mg total) by mouth every 8 (eight) hours as needed for anxiety or sleep.     losartan 100 MG tablet  Commonly known as:  COZAAR  Take 100 mg by mouth daily.     mupirocin ointment 2 %  Commonly known as:  BACTROBAN  Place 1 application into the nose at bedtime.     oxyCODONE 5 MG immediate release tablet  Commonly known as:  Oxy IR/ROXICODONE  1-2 tablets every 4-6 hrs as needed for breakthrough pain.  Do not take within 3 hrs of oxycontin     OxyCODONE 20 mg T12a 12 hr tablet  Commonly known as:  OXYCONTIN  Take 1 tablet (20 mg total) by mouth every 12 (twelve) hours.     polyethylene glycol packet  Commonly known as:  MIRALAX / GLYCOLAX  17grams in 16 oz of water twice a day until bowel movement.  LAXITIVE.  Restart if two days since last bowel movement     PROAIR HFA 108 (90 BASE) MCG/ACT inhaler  Generic drug:  albuterol  Inhale 1-2 puffs into the lungs every 6 (six) hours as needed for wheezing or shortness of breath.     rOPINIRole 1 MG tablet  Commonly known as:  REQUIP  Take 1 mg by mouth 4 (four) times daily.        Diagnostic Studies: No results found.  Disposition: 01-Home or Self Care      Discharge Instructions    CPM    Complete by:  As directed   Continuous passive motion machine (CPM):      Use the CPM from 0 to 90 for 6 hours per day.       You may break it up into 2 or 3 sessions per day.      Use CPM for 2 weeks or until you are told to stop.     Call MD / Call 911    Complete by:  As directed   If you experience chest pain or shortness of breath, CALL 911 and be transported to the hospital emergency room.  If you develope a fever above 101 F, pus (white drainage) or increased drainage or redness at the wound, or calf pain, call your surgeon's office.     Change dressing    Complete by:  As directed   Change the gauze dressing daily with sterile  4 x 4  inch gauze and apply TED hose.  DO NOT REMOVE BANDAGE OVER SURGICAL INCISION.  [place]annon WHOLE LEG INCLUDING OVER THE WATERPROOF BANDAGE WITH SOAP AND WATER EVERY DAY.     Constipation Prevention    Complete by:  As directed   Drink plenty of fluids.  Prune juice may be helpful.  You may use a stool softener, such as Colace (over the counter) 100 mg twice a day.  Use MiraLax (over the counter) for constipation as needed.     Diet - low sodium heart healthy    Complete by:  As directed      Discharge instructions    Complete by:  As directed   INSTRUCTIONS AFTER JOINT REPLACEMENT   Remove items at home which could result in a fall. This includes throw rugs or furniture in walking pathways ICE to the affected joint every three hours while awake for 30 minutes at a time, for at least the first 3-5 days, and then as needed for pain and swelling.  Continue to use ice for pain and swelling. You may notice swelling that will progress down to the foot and ankle.  This is normal after surgery.  Elevate your leg when you are not up walking on it.   Continue to use the breathing machine you got in the hospital (incentive spirometer) which will help keep your temperature down.  It is common for your temperature to cycle up and down following surgery, especially at night when you are not up moving around and exerting yourself.  The breathing machine keeps your lungs expanded and your temperature down.   DIET:  As you were doing prior to hospitalization, we recommend a well-balanced diet.  DRESSING / WOUND CARE / SHOWERING  Keep the surgical dressing until follow up.  The dressing is water proof, so you can shower without any extra covering.  IF THE DRESSING FALLS OFF or the wound gets wet inside, change the dressing with sterile gauze.  Please use good hand washing techniques before changing the dressing.  Do not use any lotions or creams on the incision until instructed by your surgeon.    ACTIVITY  Increase  activity slowly as tolerated, but follow the weight bearing instructions below.   No driving for 6 weeks or until further direction given by your physician.  You cannot drive while taking narcotics.  No lifting or carrying greater than 10 lbs. until further directed by your surgeon. Avoid periods of inactivity such as sitting longer than an hour when not asleep. This helps prevent blood clots.  You may return to work once you are authorized by your doctor.     WEIGHT BEARING   Weight bearing as tolerated with assist device (walker, cane, etc) as directed, use it as long as suggested by your surgeon or therapist.  Can transition to a cane in the next 1-2 weeks   EXERCISES  Results after joint replacement surgery are often greatly improved when you follow the exercise, range of motion and muscle strengthening exercises prescribed by your doctor. Safety measures are also important to protect the joint from further injury. Any time any of these exercises cause you to have increased pain or swelling, decrease what you are doing until you are comfortable again and then slowly increase them. If you have problems or questions, call your caregiver or physical therapist for advice.   Rehabilitation is important following a joint replacement. After just a few days of immobilization, the muscles  of the leg can become weakened and shrink (atrophy).  These exercises are designed to build up the tone and strength of the thigh and leg muscles and to improve motion. Often times heat used for twenty to thirty minutes before working out will loosen up your tissues and help with improving the range of motion but do not use heat for the first two weeks following surgery (sometimes heat can increase post-operative swelling).   These exercises can be done on a training (exercise) mat, on the floor, on a table or on a bed. Use whatever works the best and is most comfortable for you.    Use music or television while you  are exercising so that the exercises are a pleasant break in your day. This will make your life better with the exercises acting as a break in your routine that you can look forward to.   Perform all exercises about fifteen times, three times per day or as directed.  You should exercise both the operative leg and the other leg as well.   Exercises include:   Quad Sets - Tighten up the muscle on the front of the thigh (Quad) and hold for 5-10 seconds.   Straight Leg Raises - With your knee straight (if you were given a brace, keep it on), lift the leg to 60 degrees, hold for 3 seconds, and slowly lower the leg.  Perform this exercise against resistance later as your leg gets stronger.  Leg Slides: Lying on your back, slowly slide your foot toward your buttocks, bending your knee up off the floor (only go as far as is comfortable). Then slowly slide your foot back down until your leg is flat on the floor again.  Angel Wings: Lying on your back spread your legs to the side as far apart as you can without causing discomfort.  Hamstring Strength:  Lying on your back, push your heel against the floor with your leg straight by tightening up the muscles of your buttocks.  Repeat, but this time bend your knee to a comfortable angle, and push your heel against the floor.  You may put a pillow under the heel to make it more comfortable if necessary.   A rehabilitation program following joint replacement surgery can speed recovery and prevent re-injury in the future due to weakened muscles. Contact your doctor or a physical therapist for more information on knee rehabilitation.    CONSTIPATION  Constipation is defined medically as fewer than three stools per week and severe constipation as less than one stool per week.  Even if you have a regular bowel pattern at home, your normal regimen is likely to be disrupted due to multiple reasons following surgery.  Combination of anesthesia, postoperative narcotics,  change in appetite and fluid intake all can affect your bowels.   YOU MUST use at least one of the following options; they are listed in order of increasing strength to get the job done.  They are all available over the counter, and you may need to use some, POSSIBLY even all of these options:    Drink plenty of fluids (prune juice may be helpful) and high fiber foods Colace 100 mg by mouth twice a day  Senokot for constipation as directed and as needed Dulcolax (bisacodyl), take with full glass of water  Miralax (polyethylene glycol) once or twice a day as needed.  If you have tried all these things and are unable to have a bowel movement in the first  3-4 days after surgery call either your surgeon or your primary doctor.    If you experience loose stools or diarrhea, hold the medications until you stool forms back up.  If your symptoms do not get better within 1 week or if they get worse, check with your doctor.  If you experience "the worst abdominal pain ever" or develop nausea or vomiting, please contact the office immediately for further recommendations for treatment.   ITCHING:  If you experience itching with your medications, try taking only a single pain pill, or even half a pain pill at a time.  You can also use Benadryl over the counter for itching or also to help with sleep.   TED HOSE STOCKINGS:  Use stockings on both legs until for at least 2 weeks or as directed by physician office. They may be removed at night for sleeping.  MEDICATIONS:  See your medication summary on the "After Visit Summary" that nursing will review with you.  You may have some home medications which will be placed on hold until you complete the course of blood thinner medication.  It is important for you to complete the blood thinner medication as prescribed.  PRECAUTIONS:  If you experience chest pain or shortness of breath - call 911 immediately for transfer to the hospital emergency department.   If you  develop a fever greater that 101 F, purulent drainage from wound, increased redness or drainage from wound, foul odor from the wound/dressing, or calf pain - CONTACT YOUR SURGEON.                                                   FOLLOW-UP APPOINTMENTS:  If you do not already have a post-op appointment, please call the office for an appointment to be seen by your surgeon.  Guidelines for how soon to be seen are listed in your "After Visit Summary", but are typically between 1-4 weeks after surgery.  OTHER INSTRUCTIONS:   Knee Replacement:  Do not place pillow under knee, focus on keeping the knee straight while resting. CPM instructions: 0-90 degrees, 2 hours in the morning, 2 hours in the afternoon, and 2 hours in the evening. Place foam block, curve side up under heel at all times except when in CPM or when walking.  DO NOT modify, tear, cut, or change the foam block in any way.  MAKE SURE YOU:  Understand these instructions.  Get help right away if you are not doing well or get worse.    Thank you for letting us be a part of your medical care team.  It is a privilege we respect greatly.  We hope these instructions will help you stay on track for a fast and full recovery!     Do not put a pillow under the knee. Place it under the heel.    Complete by:  As directed   Place gray foam block, curve side up under heel at all times except when in CPM or when walking.  DO NOT modify, tear, cut, or change in any way the gray foam block.     Increase activity slowly as tolerated    Complete by:  As directed      TED hose    Complete by:  As directed   Use stockings (TED hose) for 2 weeks on both  leg(s).  You may remove them at night for sleeping.           Follow-up Information    Follow up with [facility].   Why:  They will contact you to schedule home therapy visits.   Contact information:   [address] [place] [address] [phone]       Follow up with  Lorn Junes, MD On 08/06/2015.   Specialty:  Orthopedic Surgery   Why:  appt time 2 pm   Contact information:   [address] [place] [address] [phone]        Signed: Linda Hedges 07/25/2015, 7:18 AM

## 2015-09-26 ENCOUNTER — Other Ambulatory Visit: Payer: Self-pay | Admitting: Physical Medicine and Rehabilitation

## 2015-09-26 DIAGNOSIS — M545 Low back pain: Secondary | ICD-10-CM

## 2015-10-01 ENCOUNTER — Ambulatory Visit
Admission: RE | Admit: 2015-10-01 | Discharge: 2015-10-01 | Disposition: A | Discharge status: Home / Self Care | Payer: 59 | Source: Ambulatory Visit | Attending: Physical Medicine and Rehabilitation | Admitting: Physical Medicine and Rehabilitation

## 2015-10-01 ENCOUNTER — Inpatient Hospital Stay
Admission: RE | Admit: 2015-10-01 | Discharge: 2015-10-01 | Disposition: A | Discharge status: Home / Self Care | Payer: 59 | Source: Ambulatory Visit | Attending: Physical Medicine and Rehabilitation | Admitting: Physical Medicine and Rehabilitation

## 2015-10-01 DIAGNOSIS — M545 Low back pain: Secondary | ICD-10-CM

## 2015-12-20 ENCOUNTER — Encounter: Payer: Self-pay | Admitting: Internal Medicine

## 2015-12-20 ENCOUNTER — Ambulatory Visit (INDEPENDENT_AMBULATORY_CARE_PROVIDER_SITE_OTHER): Payer: BLUE CROSS/BLUE SHIELD | Admitting: Internal Medicine

## 2015-12-20 VITALS — BP 122/86 | HR 68 | Ht 68.0 in | Wt 233.8 lb

## 2015-12-20 DIAGNOSIS — E785 Hyperlipidemia, unspecified: Secondary | ICD-10-CM | POA: Diagnosis not present

## 2015-12-20 DIAGNOSIS — Z951 Presence of aortocoronary bypass graft: Secondary | ICD-10-CM

## 2015-12-20 DIAGNOSIS — I255 Ischemic cardiomyopathy: Secondary | ICD-10-CM

## 2015-12-20 DIAGNOSIS — Z889 Allergy status to unspecified drugs, medicaments and biological substances status: Secondary | ICD-10-CM

## 2015-12-20 DIAGNOSIS — Z789 Other specified health status: Secondary | ICD-10-CM

## 2015-12-20 NOTE — Patient Instructions (Signed)
Dr Hilty has made no changes today in your current medications or treatment plan.  Your physician recommends that you schedule a follow-up appointment in 1 year. You will receive a reminder letter in the mail two months in advance. If you don't receive a letter, please call our office to schedule the follow-up appointment.  If you need a refill on your cardiac medications before your next appointment, please call your pharmacy. 

## 2015-12-20 NOTE — Progress Notes (Signed)
OFFICE NOTE  Chief Complaint:  No complaints   Primary Care Physician: Lee Mclaughlin  HPI:  Lee Mclaughlin  is a 65 year old gentleman with a history of coronary disease, status post CABG x 4 in 2000 (LIMA to LAD, SVG to T1, SVG to PDA (left dominant), free radial graft to OM1. His heart cath in 2008 showed no obstructive coronary artery disease after his nuclear stress test was negative. EF was 40%; however, came back to normal in 2012. He is actually incredibly active at this point. 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 He does high impact activity. Recentl was involved with the [employer] program and had been going out to [facility] to receive treatment, until recently when he had a disagreement with his boss and ultimately lost his job and these treatments. This consisted of anti-aging therapy of vitamins, supplements, testosterone injections, etc. I reviewed laboratory work that was performed from that program, which indicates low hemoglobin A1c of 5.3. His CBC was within normal limits. Testosterone tests were at the high end of normal with low fasting serum insulin. His PSA was low. T3, TSH, and T4 were within normal limits. His lipid profile showed total cholesterol of 153, triglycerides 94, HDL 49, LDL 85, which is a good profile. He does have a history of intolerance to statins and has been maintained on fenofibrate and Zetia, and for not being on a statin, actually that LDL cholesterol is pretty low.   He recently discontinued his statin medication and reports marked improvement in chronic pain. He's also stopped high level activity and exercise and has cut back significantly on testosterone injections. He reports that he wishes now to get back to doing some exercise after he returns from a visit out of the country. He is curious to see what his cholesterol looks like today as it's been about 6 months off of medication.  Finally he is reporting some  tenderness and tingling with occasional swelling and a lump that develops under the left lateral malleolus. There is some associated numbness with this.  I saw Lee Mclaughlin back in the office today. He is reporting some abdominal bloating and recently has had problems with some fatigue. He decrease his exercise considerably. He's also had some weight gain. He had stopped taking testosterone but started back on it every 2 weeks. He needs to get back to his exercise. He denies any chest pain or shortness of breath. He's interested in a recheck of his cholesterol today.  Lee Mclaughlin returns to the office today for follow-up. Overall he seems to be doing really well. He underwent right knee replacement surgery and says that he has had marked improvement in that. His gait is improved, his low back pain is improved. In fact he says he put on about 95 miles of walking during a three-week trip to [place] that he recently spent. He denies any chest pain or worsening shortness of breath. He walked up several 100 stairs and the [facility] without shortness of breath or chest pain.  PMHx:  Past Medical History  Diagnosis Date  . Coronary artery disease   . S/P CABG (coronary artery bypass graft) 2000  . H/O cardiovascular stress test 10/19/2012    MET test - good effort, peak VO2 of almost 112% predicted, HR 79% predicted, mildly ischemia at end of exercise  . Ischemic cardiomyopathy     history of   . Dyslipidemia   . History of nuclear  stress test 03/2011    bruce myoview; mild perfusion defect in mid anteroseptal, apical septal, apical regions (infarct/scar/breast attenuation); post-stress EF 55%; no signficant ischemia  . Primary localized osteoarthritis of right knee   . Staphylococcal arthritis of right knee ([place]) 2004  . PONV (postoperative nausea and vomiting)   . Wears glasses   . Myocardial infarction ([place])   . History of hyperthyroidism as a child     Past Surgical History  Procedure Laterality  Date  . Coronary artery bypass graft  06/05/1999    LIMA to LAD, SVG to diagonal 1, SVG to PDA of Cfx, right radial graft to OM1 (Dr. Remus Loffler)   . Cervical spine surgery    . Cardiac catheterization  06/01/2007    no obstructive CAD, patent grafts, EF 40%, apical hypokinesis, anterolateral hypokinesis (Dr. Gerrie Nordmann)   . Knee surgery Right     pt. states x 6   . Transthoracic echocardiogram  02/2008    EF 40%, severe apical wall hypokinesis, mod-severe anterior wall hypokinesis; RV mildly dilated; mild mitral annular calcif; mild TR, RSVP 30-25mHg  . Cholecystectomy    . Eye surgery    . Colonoscopy    . Esophagogastroduodenoscopy    . Total knee arthroplasty Right 07/23/2015    Procedure: RIGHT TOTAL KNEE ARTHROPLASTY;  Surgeon: RElsie Saas MD;  [facility]  Service: Orthopedics;  Laterality: Right;    FAMHx:  Family History  Problem Relation Age of Onset  . CAD Father     s/p CABGx4 at 739 . Hyperlipidemia Father     SOCHx:   reports that he has quit smoking. His smoking use included Cigars. He does not have any smokeless tobacco history on file. He reports that he drinks alcohol. He reports that he does not use illicit drugs.  ALLERGIES:  Allergies  Allergen Reactions  . Morphine And Related Nausea And Vomiting  . Crestor [Rosuvastatin]     Myalgias   . Vytorin [Ezetimibe-Simvastatin]     Myalgias     ROS: A comprehensive review of systems was negative.  HOME MEDS: Current Outpatient Prescriptions  Medication Sig Dispense Refill  . carvedilol (COREG) 12.5 MG tablet Take 12.5 mg by mouth 2 (two) times daily with a meal.    . fenofibrate 54 MG tablet Take 54 mg by mouth daily.    .Marland Kitchenlosartan (COZAAR) 100 MG tablet Take 100 mg by mouth daily.    .Marland KitchenPROAIR HFA 108 (90 BASE) MCG/ACT inhaler Inhale 1-2 puffs into the lungs every 6 (six) hours as needed for wheezing or shortness of breath.     .Marland KitchenrOPINIRole (REQUIP) 1 MG tablet Take 1 mg by mouth 4 (four) times  daily.     . traZODone (DESYREL) 50 MG tablet Take 1 tablet by mouth daily as needed.  0   No current facility-administered medications for this visit.    LABS/IMAGING: No results found for this or any previous visit (from the past 48 hour(s)). No results found.  VITALS: BP 122/86 mmHg  Pulse 68  Ht _0  (1.727 m)  Wt 233 lb 12.8 oz (106.051 kg)  BMI 35.56 kg/m2  EXAM: General appearance: alert and no distress Neck: no carotid bruit and no JVD Lungs: clear to auscultation bilaterally Heart: regular rate and rhythm, S1, S2 normal, no murmur, click, rub or gallop Abdomen: soft, non-tender; bowel sounds normal; no masses,  no organomegaly Extremities: extremities normal, atraumatic, no cyanosis or edema and mild tenderness over  the left lateral malleolus Pulses: 2+ and symmetric Skin: Skin color, texture, turgor normal. No rashes or lesions Neurologic: Grossly normal Psych: Mood, affect normal  EKG: Normal sinus rhythm at 68  ASSESSMENT: 1. Coronary artery disease status post four-vessel CABG in 2000 2. Dyslipidemia-intolerant to statins 3. Possible gout 4. History of ischemic cardiomyopathy, EF 40% now improved to 55% 5. Status post right TKR  PLAN: 1.   Lee Mclaughlin is doing well. He underwent knee replacement without difficulty. He is able to ambulate without any trouble in fact did significant amount of exertion during his recent trip to [place] without any difficulty. This would be considered a good stress test for him. He seems to be doing well and will continue his current medications. Plan to see him back annually or sooner as necessary.  Pixie Casino, MD, [facility] Attending Cardiologist [place] C Hilty 12/20/2015, 10:50 AM

## 2016-09-22 DIAGNOSIS — M1711 Unilateral primary osteoarthritis, right knee: Secondary | ICD-10-CM | POA: Diagnosis present

## 2016-09-22 DIAGNOSIS — M00061 Staphylococcal arthritis, right knee: Secondary | ICD-10-CM | POA: Diagnosis present

## 2016-10-31 DIAGNOSIS — L820 Inflamed seborrheic keratosis: Secondary | ICD-10-CM | POA: Insufficient documentation

## 2017-09-24 DIAGNOSIS — J40 Bronchitis, not specified as acute or chronic: Secondary | ICD-10-CM | POA: Diagnosis not present

## 2017-11-18 DIAGNOSIS — M542 Cervicalgia: Secondary | ICD-10-CM | POA: Diagnosis not present

## 2017-11-19 ENCOUNTER — Telehealth: Payer: Self-pay | Admitting: Internal Medicine

## 2017-11-19 NOTE — Telephone Encounter (Signed)
Okay to use meloxicam for short period of time (acute).

## 2017-11-19 NOTE — Telephone Encounter (Signed)
Okay to follow instructions from PCP. Avoid taking for > 1 week

## 2017-11-19 NOTE — Telephone Encounter (Signed)
Instructions relayed via VM (detailed msg OK per DPR). Advised to call back if questions or further needs.

## 2017-11-19 NOTE — Telephone Encounter (Signed)
Will seek review by pharmD.

## 2017-11-19 NOTE — Telephone Encounter (Signed)
Pt says he is having neck problems and his doctor wants to know if it is alright for him to take Meloxicam 7.5mg ?

## 2017-11-19 NOTE — Telephone Encounter (Signed)
Patient needs specificity regarding use of this medication - how often and what a short period of time would constitute - please advise.

## 2017-12-07 DIAGNOSIS — M542 Cervicalgia: Secondary | ICD-10-CM | POA: Diagnosis not present

## 2017-12-17 DIAGNOSIS — M542 Cervicalgia: Secondary | ICD-10-CM | POA: Diagnosis not present

## 2017-12-17 DIAGNOSIS — M791 Myalgia, unspecified site: Secondary | ICD-10-CM | POA: Diagnosis not present

## 2018-01-21 DIAGNOSIS — M542 Cervicalgia: Secondary | ICD-10-CM | POA: Diagnosis not present

## 2018-01-21 DIAGNOSIS — M4802 Spinal stenosis, cervical region: Secondary | ICD-10-CM | POA: Diagnosis not present

## 2018-03-19 DIAGNOSIS — R05 Cough: Secondary | ICD-10-CM | POA: Diagnosis not present

## 2018-03-19 DIAGNOSIS — N529 Male erectile dysfunction, unspecified: Secondary | ICD-10-CM | POA: Diagnosis not present

## 2018-03-19 DIAGNOSIS — J301 Allergic rhinitis due to pollen: Secondary | ICD-10-CM | POA: Diagnosis not present

## 2018-03-19 DIAGNOSIS — R059 Cough, unspecified: Secondary | ICD-10-CM | POA: Insufficient documentation

## 2018-05-14 DIAGNOSIS — H353130 Nonexudative age-related macular degeneration, bilateral, stage unspecified: Secondary | ICD-10-CM | POA: Diagnosis not present

## 2018-07-19 DIAGNOSIS — H25043 Posterior subcapsular polar age-related cataract, bilateral: Secondary | ICD-10-CM | POA: Diagnosis not present

## 2018-07-19 DIAGNOSIS — H04122 Dry eye syndrome of left lacrimal gland: Secondary | ICD-10-CM | POA: Diagnosis not present

## 2018-07-19 DIAGNOSIS — H04123 Dry eye syndrome of bilateral lacrimal glands: Secondary | ICD-10-CM | POA: Diagnosis not present

## 2018-07-19 DIAGNOSIS — H2513 Age-related nuclear cataract, bilateral: Secondary | ICD-10-CM | POA: Diagnosis not present

## 2018-07-19 DIAGNOSIS — H04121 Dry eye syndrome of right lacrimal gland: Secondary | ICD-10-CM | POA: Diagnosis not present

## 2018-07-19 DIAGNOSIS — H25013 Cortical age-related cataract, bilateral: Secondary | ICD-10-CM | POA: Diagnosis not present

## 2018-09-07 ENCOUNTER — Telehealth: Payer: Self-pay | Admitting: *Deleted

## 2018-09-07 ENCOUNTER — Other Ambulatory Visit: Payer: Self-pay | Admitting: Internal Medicine

## 2018-09-07 MED ORDER — METOPROLOL TARTRATE 50 MG PO TABS: 50.0000 mg | ORAL_TABLET | Freq: Two times a day (BID) | ORAL | 3 refills | Status: DC

## 2018-09-07 NOTE — Telephone Encounter (Signed)
Spoke with patient who reports he was advised that his carvedilol would be changed to metoprolol per DOD Dr. Duke Salvia (see earlier phone note). Rx(s) sent to pharmacy electronically.  Provided patient with AFib clinic phone # to call to obtain office address and parking info.

## 2018-09-07 NOTE — Telephone Encounter (Signed)
Herb Grays RN advised patient of appointment time and location tomorrow at [facility] clinic

## 2018-09-07 NOTE — Telephone Encounter (Signed)
New Message    *STAT* If patient is at the pharmacy, call can be transferred to refill team.   1. Which medications need to be refilled? (please list name of each medication and dose if known) Metoprolol  2. Which pharmacy/location (including street and city if local pharmacy) is medication to be sent to? [facility] on [address], states the pharmacies on file are incorrect  3. Do they need a 30 day or 90 day supply? 90

## 2018-09-07 NOTE — Telephone Encounter (Addendum)
Patient called in regarding Metoprolol, Herb Grays RN sent Rx to pharmacy    Dr Duke Salvia spoke with [facility] and patient has new onset Afib. She recommended follow up in Afib clinic tomorrow if possible, stop Carvedilol and start Metoprolol 50 mg twice a day . Requested EKG be faxed Scheduled appointment with Afib clinic tomorrow. Patient scheduled for tomorrow.  Left message to call back

## 2018-09-08 ENCOUNTER — Ambulatory Visit (HOSPITAL_COMMUNITY)
Admission: RE | Admit: 2018-09-08 | Discharge: 2018-09-08 | Disposition: A | Discharge status: Home / Self Care | Payer: PPO | Source: Ambulatory Visit | Attending: Nurse Practitioner | Admitting: Nurse Practitioner

## 2018-09-08 ENCOUNTER — Encounter (HOSPITAL_COMMUNITY): Payer: Self-pay | Admitting: Nurse Practitioner

## 2018-09-08 VITALS — BP 116/76 | HR 136 | Ht 68.0 in | Wt 233.0 lb

## 2018-09-08 DIAGNOSIS — Z87891 Personal history of nicotine dependence: Secondary | ICD-10-CM | POA: Diagnosis not present

## 2018-09-08 DIAGNOSIS — Z7982 Long term (current) use of aspirin: Secondary | ICD-10-CM | POA: Diagnosis not present

## 2018-09-08 DIAGNOSIS — Z7901 Long term (current) use of anticoagulants: Secondary | ICD-10-CM | POA: Insufficient documentation

## 2018-09-08 DIAGNOSIS — E785 Hyperlipidemia, unspecified: Secondary | ICD-10-CM | POA: Insufficient documentation

## 2018-09-08 DIAGNOSIS — I252 Old myocardial infarction: Secondary | ICD-10-CM | POA: Diagnosis not present

## 2018-09-08 DIAGNOSIS — Z885 Allergy status to narcotic agent status: Secondary | ICD-10-CM | POA: Insufficient documentation

## 2018-09-08 DIAGNOSIS — Z9049 Acquired absence of other specified parts of digestive tract: Secondary | ICD-10-CM | POA: Insufficient documentation

## 2018-09-08 DIAGNOSIS — Z9889 Other specified postprocedural states: Secondary | ICD-10-CM | POA: Diagnosis not present

## 2018-09-08 DIAGNOSIS — Z96651 Presence of right artificial knee joint: Secondary | ICD-10-CM | POA: Diagnosis not present

## 2018-09-08 DIAGNOSIS — Z951 Presence of aortocoronary bypass graft: Secondary | ICD-10-CM | POA: Insufficient documentation

## 2018-09-08 DIAGNOSIS — I483 Typical atrial flutter: Secondary | ICD-10-CM | POA: Diagnosis not present

## 2018-09-08 DIAGNOSIS — I2510 Atherosclerotic heart disease of native coronary artery without angina pectoris: Secondary | ICD-10-CM | POA: Diagnosis not present

## 2018-09-08 DIAGNOSIS — Z79899 Other long term (current) drug therapy: Secondary | ICD-10-CM | POA: Insufficient documentation

## 2018-09-08 LAB — CBC
HEMATOCRIT: 52.8 % — AB (ref 39.0–52.0)
Hemoglobin: 17.1 g/dL — ABNORMAL HIGH (ref 13.0–17.0)
MCH: 29.8 pg (ref 26.0–34.0)
MCHC: 32.4 g/dL (ref 30.0–36.0)
MCV: 92.1 fL (ref 78.0–100.0)
Platelets: 154 10*3/uL (ref 150–400)
RBC: 5.73 MIL/uL (ref 4.22–5.81)
RDW: 13.4 % (ref 11.5–15.5)
WBC: 9.1 10*3/uL (ref 4.0–10.5)

## 2018-09-08 LAB — BASIC METABOLIC PANEL
Anion gap: 9 (ref 5–15)
BUN: 29 mg/dL — ABNORMAL HIGH (ref 8–23)
CALCIUM: 10.2 mg/dL (ref 8.9–10.3)
CO2: 25 mmol/L (ref 22–32)
Chloride: 103 mmol/L (ref 98–111)
Creatinine, Ser: 1.23 mg/dL (ref 0.61–1.24)
GFR calc non Af Amer: 59 mL/min — ABNORMAL LOW (ref >60)
GLUCOSE: 97 mg/dL (ref 70–99)
Potassium: 4.4 mmol/L (ref 3.5–5.1)
Sodium: 137 mmol/L (ref 135–145)

## 2018-09-08 LAB — TSH: TSH: 3.754 u[IU]/mL (ref 0.350–4.500)

## 2018-09-08 MED ORDER — APIXABAN 5 MG PO TABS: 5.0000 mg | ORAL_TABLET | Freq: Two times a day (BID) | ORAL | 0 refills | Status: DC

## 2018-09-08 MED ORDER — METOPROLOL TARTRATE 50 MG PO TABS: 75.0000 mg | ORAL_TABLET | Freq: Two times a day (BID) | ORAL | 3 refills | Status: DC

## 2018-09-08 NOTE — Progress Notes (Signed)
Primary Care Physician: Amie Critchley Referring Physician: [facility] triage Cardiology: D. Hilty   Lee Mclaughlin is a 67 y.o. male with a h/o CAD, s/p bypass, ischemic cardiomyopathy,MI, that is in the afib clinic for new onset atrial flutter at time of cataract surgery. He is largely asymptomatic but has noted some mild shortness of breath with some activities. Minimal alcohol, no tobacco,some snoring several years ago had a sleep study, states it did not show any apnea at that time, obese, no regular exercise program 2/2 joint issues..  Today, he denies symptoms of palpitations, chest pain, shortness of breath, orthopnea, PND, lower extremity edema, dizziness, presyncope, syncope, or neurologic sequela. The patient is tolerating medications without difficulties and is otherwise without complaint today.   Past Medical History:  Diagnosis Date  . Coronary artery disease   . Dyslipidemia   . H/O cardiovascular stress test 10/19/2012   MET test - good effort, peak VO2 of almost 112% predicted, HR 79% predicted, mildly ischemia at end of exercise  . History of hyperthyroidism as a child   . History of nuclear stress test 03/2011   bruce myoview; mild perfusion defect in mid anteroseptal, apical septal, apical regions (infarct/scar/breast attenuation); post-stress EF 55%; no signficant ischemia  . Ischemic cardiomyopathy    history of   . Myocardial infarction ([place])   . PONV (postoperative nausea and vomiting)   . Primary localized osteoarthritis of right knee   . S/P CABG (coronary artery bypass graft) 2000  . 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 Wears glasses    Past Surgical History:  Procedure Laterality Date  . CARDIAC CATHETERIZATION  06/01/2007   no obstructive CAD, patent grafts, EF 40%, apical hypokinesis, anterolateral hypokinesis (Dr. Gerrie Nordmann)   . CERVICAL SPINE SURGERY    . CHOLECYSTECTOMY    . COLONOSCOPY    . CORONARY ARTERY BYPASS GRAFT   06/05/1999   LIMA to LAD, SVG to diagonal 1, SVG to PDA of Cfx, right radial graft to OM1 (Dr. Remus Loffler)   . ESOPHAGOGASTRODUODENOSCOPY    . EYE SURGERY    . KNEE SURGERY Right    pt. states x 6   . TOTAL KNEE ARTHROPLASTY Right 07/23/2015   Procedure: RIGHT TOTAL KNEE ARTHROPLASTY;  Surgeon: Elsie Saas, MD;  Location: [place];  Service: Orthopedics;  Laterality: Right;  . TRANSTHORACIC ECHOCARDIOGRAM  02/2008   EF 40%, severe apical wall hypokinesis, mod-severe anterior wall hypokinesis; RV mildly dilated; mild mitral annular calcif; mild TR, RSVP 30-83mHg    Current Outpatient Medications  Medication Sig Dispense Refill  . aspirin 81 MG tablet Take 81 mg by mouth daily.    . cyclobenzaprine (FLEXERIL) 10 MG tablet Take 10 mg by mouth 3 (three) times daily as needed for muscle spasms.    . fenofibrate 54 MG tablet Take 54 mg by mouth daily.    .Marland Kitchenlosartan (COZAAR) 100 MG tablet Take 100 mg by mouth daily.    . metoprolol tartrate (LOPRESSOR) 50 MG tablet Take 1.5 tablets (75 mg total) by mouth 2 (two) times daily. 180 tablet 3  . PROAIR HFA 108 (90 BASE) MCG/ACT inhaler Inhale 1-2 puffs into the lungs every 6 (six) hours as needed for wheezing or shortness of breath.     .Marland KitchenrOPINIRole (REQUIP) 1 MG tablet Take 1 mg by mouth 4 (four) times daily.     . traZODone (DESYREL) 50 MG tablet Take 1 tablet by mouth daily as needed.  0  . apixaban (ELIQUIS) 5 MG TABS tablet Take 1 tablet (5 mg total) by mouth 2 (two) times daily. 60 tablet 0   No current facility-administered medications for this encounter.     Allergies  Allergen Reactions  . Morphine And Related Nausea And Vomiting  . Crestor [Rosuvastatin]     Myalgias   . Vytorin [Ezetimibe-Simvastatin]     Myalgias     Social History   Socioeconomic History  . Marital status: Married    Spouse name: Not on file  . Number of children: 2  . Years of education: 12  . Highest education level: Not on file  Occupational History     Employer: [employer]  Social Needs  . Financial resource strain: Not on file  . Food insecurity:    Worry: Not on file    Inability: Not on file  . Transportation needs:    Medical: Not on file    Non-medical: Not on file  Tobacco Use  . Smoking status: Former Smoker    Types: Cigars  . Smokeless tobacco: Never Used  . Tobacco comment: cigar occassionally  Substance and Sexual Activity  . Alcohol use: Yes    Alcohol/week: 0.0 standard drinks    Comment: socially  . Drug use: No  . Sexual activity: Not on file  Lifestyle  . Physical activity:    Days per week: Not on file    Minutes per session: Not on file  . Stress: Not on file  Relationships  . Social connections:    Talks on phone: Not on file    Gets together: Not on file    Attends religious service: Not on file    Active member of club or organization: Not on file    Attends meetings of clubs or organizations: Not on file    Relationship status: Not on file  . Intimate partner violence:    Fear of current or ex partner: Not on file    Emotionally abused: Not on file    Physically abused: Not on file    Forced sexual activity: Not on file  Other Topics Concern  . Not on file  Social History Narrative  . Not on file    Family History  Problem Relation Age of Onset  . CAD Father        s/p CABGx4 at 74  . Hyperlipidemia Father     ROS- All systems are reviewed and negative except as per the HPI above  Physical Exam: Vitals:   09/08/18 1051  BP: 116/76  Pulse: (!) 136  Weight: 105.7 kg  Height: 5' 8" (1.727 m)   Wt Readings from Last 3 Encounters:  09/08/18 105.7 kg  12/20/15 106.1 kg  07/23/15 104.3 kg    Labs: Lab Results  Component Value Date   NA 132 (L) 07/25/2015   K 4.4 07/25/2015   CL 98 (L) 07/25/2015   CO2 27 07/25/2015   GLUCOSE 145 (H) 07/25/2015   BUN 24 (H) 07/25/2015   CREATININE 1.10 07/25/2015   CALCIUM 8.9 07/25/2015   Lab Results  Component Value Date   INR  1.12 07/13/2015   Lab Results  Component Value Date   CHOL 240 (H) 12/14/2014   HDL 46 12/14/2014   LDLCALC 155 (H) 12/14/2014   TRIG 194 (H) 12/14/2014     GEN- The patient is well appearing, alert and oriented x 3 today.   Head- normocephalic, atraumatic Eyes-  Sclera clear, conjunctiva pink   Ears- hearing intact Oropharynx- clear Neck- supple, no JVP Lymph- no cervical lymphadenopathy Lungs- Clear to ausculation bilaterally, normal work of breathing Heart- Rapid regular rate and rhythm, no murmurs, rubs or gallops, PMI not laterally displaced GI- soft, NT, ND, + BS Extremities- no clubbing, cyanosis, or edema MS- no significant deformity or atrophy Skin- no rash or lesion Psych- euthymic mood, full affect Neuro- strength and sensation are intact  EKG- typical atrial  Flutter at 136 bpm, and confirmed by Dr. Taylor at 136 bpm, EKG  reads acute MI, but does not fit with clinical presentation( no chest discomfort, stable and is probably  2/2 flutter waves).    Assessment and Plan: 1. New onset typical atrial flutter  General education re flutter Will send to Dr. Taylor to discuss typical atrial  flutter ablation Will increase rate control, metoprolol to 75 mg bid  Cbc/bmet/tsh today  2. Chadsvasc score of 2 Will start eliquis 5 mg bid Denies bleeding risk Bleeding precautions discuused Asked not to take NSAID's and to take tylenol if needed I will ask Dr. Hilty  if ASA can be stopped  Appointment  requested with Dr. Taylor in 2 weeks  Donna C. Carroll, ANP-C Afib Clinic [facility] [address] [place], [place] [address] [phone]  

## 2018-09-08 NOTE — Patient Instructions (Signed)
Increase metoprolol to 75mg  twice a day (1 and 1/2 tablet twice a day)  Scheduling will call for appointment with Dr. Ladona Ridgel

## 2018-09-16 ENCOUNTER — Telehealth: Payer: Self-pay | Admitting: Internal Medicine

## 2018-09-16 NOTE — Telephone Encounter (Signed)
Pt rescheduled

## 2018-09-16 NOTE — Telephone Encounter (Signed)
New Message         Patient is needing a appointment on 10/17. Patient is a little upset because he was scheduled and no one told him about the appointment. He had to cancel a beach trip and it cost him a $1000.00 to cancel the trip. Patient wants a call back for a schedule on 10/17 for the consultation. Pls call and advise.

## 2018-09-22 ENCOUNTER — Institutional Professional Consult (permissible substitution): Payer: Self-pay | Admitting: Internal Medicine

## 2018-09-24 ENCOUNTER — Encounter: Payer: Self-pay | Admitting: Internal Medicine

## 2018-09-24 ENCOUNTER — Ambulatory Visit (INDEPENDENT_AMBULATORY_CARE_PROVIDER_SITE_OTHER): Payer: PPO | Admitting: Internal Medicine

## 2018-09-24 VITALS — BP 116/70 | HR 109 | Ht 68.0 in | Wt 233.0 lb

## 2018-09-24 DIAGNOSIS — Z951 Presence of aortocoronary bypass graft: Secondary | ICD-10-CM

## 2018-09-24 DIAGNOSIS — I255 Ischemic cardiomyopathy: Secondary | ICD-10-CM

## 2018-09-24 DIAGNOSIS — I483 Typical atrial flutter: Secondary | ICD-10-CM

## 2018-09-24 NOTE — Patient Instructions (Signed)
Medication Instructions:  Continue current medications You can take extra half tablet of metoprolol if heart rate is greater than 140 beats/minute If you need a refill on your cardiac medications before your next appointment, please call your pharmacy.    Testing/Procedures: Your physician has requested that you have an echocardiogram. Echocardiography is a painless test that uses sound waves to create images of your heart. It provides your doctor with information about the size and shape of your heart and how well your heart's chambers and valves are working. This procedure takes approximately one hour. There are no restrictions for this procedure. -- done at [address] - [address] Floor  Follow-Up: At [facility], you and your health needs are our priority.  As part of our continuing mission to provide you with exceptional heart care, we have created designated Provider Care Teams.  These Care Teams include your primary Cardiologist (physician) and Advanced Practice Providers (APPs -  Physician Assistants and Nurse Practitioners) who all work together to provide you with the care you need, when you need it. You will need a follow up appointment in 3 months. You may see Dr. Rennis Golden or one of the following Advanced Practice Providers on your designated Care Team: Azalee Course, [place] . Micah Flesher, PA-C  Any Other Special Instructions Will Be Listed Below (If Applicable).

## 2018-09-24 NOTE — Progress Notes (Signed)
OFFICE NOTE  Chief Complaint:  Follow-up atrial flutter  Primary Care Physician: Aletha Halim., PA-C  HPI:  Lee Mclaughlin  is a 67 year old gentleman with a history of coronary disease, status post CABG x 4 in 2000 (LIMA to LAD, SVG to T1, SVG to PDA (left dominant), free radial graft to OM1. His heart cath in 2008 showed no obstructive coronary artery disease after his nuclear stress test was negative. EF was 40%; however, came back to normal in 2012. He is actually incredibly active at this point. 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 He does high impact activity. Recentl was involved with the [employer] program and had been going out to [facility] to receive treatment, until recently when he had a disagreement with his boss and ultimately lost his job and these treatments. This consisted of anti-aging therapy of vitamins, supplements, testosterone injections, etc. I reviewed laboratory work that was performed from that program, which indicates low hemoglobin A1c of 5.3. His CBC was within normal limits. Testosterone tests were at the high end of normal with low fasting serum insulin. His PSA was low. T3, TSH, and T4 were within normal limits. His lipid profile showed total cholesterol of 153, triglycerides 94, HDL 49, LDL 85, which is a good profile. He does have a history of intolerance to statins and has been maintained on fenofibrate and Zetia, and for not being on a statin, actually that LDL cholesterol is pretty low.   He recently discontinued his statin medication and reports marked improvement in chronic pain. He's also stopped high level activity and exercise and has cut back significantly on testosterone injections. He reports that he wishes now to get back to doing some exercise after he returns from a visit out of the country. He is curious to see what his cholesterol looks like today as it's been about 6 months off of medication.  Finally he is  reporting some tenderness and tingling with occasional swelling and a lump that develops under the left lateral malleolus. There is some associated numbness with this.  I saw Tushar back in the office today. He is reporting some abdominal bloating and recently has had problems with some fatigue. He decrease his exercise considerably. He's also had some weight gain. He had stopped taking testosterone but started back on it every 2 weeks. He needs to get back to his exercise. He denies any chest pain or shortness of breath. He's interested in a recheck of his cholesterol today.  12/20/2015  Lee Mclaughlin returns to the office today for follow-up. Overall he seems to be doing really well. He underwent right knee replacement surgery and says that he has had marked improvement in that. His gait is improved, his low back pain is improved. In fact he says he put on about 95 miles of walking during a three-week trip to [place] that he recently spent. He denies any chest pain or worsening shortness of breath. He walked up several 100 stairs and the [facility] without shortness of breath or chest pain.  09/24/2018  Lee Mclaughlin is seen today in follow-up.  I last saw him in 2017.  Unfortunately recently he was undergoing cataract surgery was found to be tachycardic.  EKG demonstrated atrial flutter with rapid ventricular response.  He was generally unaware this.  He may have had some palpitations or anxiety at night prior to this.  He is also been noted to be a little more short  of breath and fatigued easier but is able to play golf without any limitations.  He was seen by Roderic Palau in the A. fib clinic.  He was started on Eliquis 5 mg twice daily on October 2.  Rate control was not ideal the time and his metoprolol was increased to 75 mg twice daily.  Heart rate today is 109.  He said just up until yesterday the rate was not well controlled.  He is generally asymptomatic.  He does have an upcoming appointment with Dr.  Caryl Comes to discuss an A. fib ablation next week.  PMHx:  Past Medical History:  Diagnosis Date  . Coronary artery disease   . Dyslipidemia   . H/O cardiovascular stress test 10/19/2012   MET test - good effort, peak VO2 of almost 112% predicted, HR 79% predicted, mildly ischemia at end of exercise  . History of hyperthyroidism as a child   . History of nuclear stress test 03/2011   bruce myoview; mild perfusion defect in mid anteroseptal, apical septal, apical regions (infarct/scar/breast attenuation); post-stress EF 55%; no signficant ischemia  . Ischemic cardiomyopathy    history of   . Myocardial infarction ([place])   . PONV (postoperative nausea and vomiting)   . Primary localized osteoarthritis of right knee   . S/P CABG (coronary artery bypass graft) 2000  . 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 Wears glasses     Past Surgical History:  Procedure Laterality Date  . CARDIAC CATHETERIZATION  06/01/2007   no obstructive CAD, patent grafts, EF 40%, apical hypokinesis, anterolateral hypokinesis (Dr. Gerrie Nordmann)   . CERVICAL SPINE SURGERY    . CHOLECYSTECTOMY    . COLONOSCOPY    . CORONARY ARTERY BYPASS GRAFT  06/05/1999   LIMA to LAD, SVG to diagonal 1, SVG to PDA of Cfx, right radial graft to OM1 (Dr. Remus Loffler)   . ESOPHAGOGASTRODUODENOSCOPY    . EYE SURGERY    . KNEE SURGERY Right    pt. states x 6   . TOTAL KNEE ARTHROPLASTY Right 07/23/2015   Procedure: RIGHT TOTAL KNEE ARTHROPLASTY;  Surgeon: Elsie Saas, MD;  Location: [place];  Service: Orthopedics;  Laterality: Right;  . TRANSTHORACIC ECHOCARDIOGRAM  02/2008   EF 40%, severe apical wall hypokinesis, mod-severe anterior wall hypokinesis; RV mildly dilated; mild mitral annular calcif; mild TR, RSVP 30-75mHg    FAMHx:  Family History  Problem Relation Age of Onset  . CAD Father        s/p CABGx4 at 741 . Hyperlipidemia Father     SOCHx:   reports that he has quit smoking. His smoking use included cigars.  He has never used smokeless tobacco. He reports that he drinks alcohol. He reports that he does not use drugs.  ALLERGIES:  Allergies  Allergen Reactions  . Morphine And Related Nausea And Vomiting  . Crestor [Rosuvastatin]     Myalgias   . Vytorin [Ezetimibe-Simvastatin]     Myalgias     ROS: Pertinent items noted in HPI and remainder of comprehensive ROS otherwise negative.  HOME MEDS: Current Outpatient Medications  Medication Sig Dispense Refill  . apixaban (ELIQUIS) 5 MG TABS tablet Take 1 tablet (5 mg total) by mouth 2 (two) times daily. 60 tablet 0  . aspirin 81 MG tablet Take 81 mg by mouth daily.    . cyclobenzaprine (FLEXERIL) 10 MG tablet Take 10 mg by mouth 3 (three) times daily as needed for muscle spasms.    .Marland Kitchen  fenofibrate 54 MG tablet Take 54 mg by mouth daily.    Marland Kitchen losartan (COZAAR) 100 MG tablet Take 100 mg by mouth daily.    . metoprolol tartrate (LOPRESSOR) 50 MG tablet Take 1.5 tablets (75 mg total) by mouth 2 (two) times daily. 180 tablet 3  . PROAIR HFA 108 (90 BASE) MCG/ACT inhaler Inhale 1-2 puffs into the lungs every 6 (six) hours as needed for wheezing or shortness of breath.     Marland Kitchen rOPINIRole (REQUIP) 1 MG tablet Take 1 mg by mouth 4 (four) times daily.     . traZODone (DESYREL) 50 MG tablet Take 1 tablet by mouth daily as needed.  0   No current facility-administered medications for this visit.     LABS/IMAGING: No results found for this or any previous visit (from the past 48 hour(s)). No results found.  VITALS: BP 116/70   Pulse (!) 109   Ht _0  (1.727 m)   Wt 233 lb (105.7 kg)   BMI 35.43 kg/m   EXAM: General appearance: alert and no distress Neck: no carotid bruit and no JVD Lungs: clear to auscultation bilaterally Heart: regular rate and rhythm, S1, S2 normal, no murmur, click, rub or gallop Abdomen: soft, non-tender; bowel sounds normal; no masses,  no organomegaly Extremities: extremities normal, atraumatic, no cyanosis or edema  and mild tenderness over the left lateral malleolus Pulses: 2+ and symmetric Skin: Skin color, texture, turgor normal. No rashes or lesions Neurologic: Grossly normal Psych: Mood, affect normal  EKG: Atrial flutter variable AV block at 109-personally reviewed  ASSESSMENT: 1. New onset atrial flutter 2. CHADSVASC score of 3 - on Eliquis 3. Coronary artery disease status post four-vessel CABG in 2000 4. Dyslipidemia-intolerant to statins 5. Possible gout 6. History of ischemic cardiomyopathy, EF 40% now improved to 55% 7. Status post right TKR  PLAN: 1.   Mr. Santilli is recently developed atrial flutter.  Recently with rapid ventricular response.  He has a history of coronary disease and prior CABG as well as an ischemic cardiomyopathy.  LVEF was as low as 40% but improved to 55%.  His last Myoview stress test was in 2016 which was nonischemic but showed an EF of 48%.  I am concerned about acute on chronic heart failure due to tachycardia induced cardiomyopathy.  We will repeat an echocardiogram.  Rate is generally well controlled today.  I advised him he could take an extra half tablet of metoprolol as needed for heart rates over 140 sustained.  I would advise he continue with low-dose aspirin.  I am not aware of any anti-ischemic data of Eliquis alone, and given his four-vessel CABG he would benefit from antiplatelet therapy.  Plan follow-up with me in a few months after seeing Dr. Caryl Comes.  Pixie Casino, MD, [facility], [place] Director of the Advanced Lipid Disorders &  Cardiovascular Risk Reduction Clinic Diplomate of the American Board of Clinical Lipidology Attending Cardiologist  Direct Dial: [phone]  Fax: [phone]  Website:  [email or web address] Aday 09/24/2018, 8:41 AM

## 2018-09-28 ENCOUNTER — Other Ambulatory Visit: Payer: Self-pay

## 2018-09-28 ENCOUNTER — Ambulatory Visit (HOSPITAL_COMMUNITY): Payer: PPO | Attending: Cardiovascular Disease

## 2018-09-28 DIAGNOSIS — Z951 Presence of aortocoronary bypass graft: Secondary | ICD-10-CM | POA: Insufficient documentation

## 2018-09-28 DIAGNOSIS — I483 Typical atrial flutter: Secondary | ICD-10-CM | POA: Diagnosis not present

## 2018-09-28 DIAGNOSIS — I255 Ischemic cardiomyopathy: Secondary | ICD-10-CM | POA: Diagnosis not present

## 2018-09-28 MED ORDER — PERFLUTREN LIPID MICROSPHERE
1.0000 mL | INTRAVENOUS | Status: AC | PRN
Start: 2018-09-28 — End: 2018-09-28
  Administered 2018-09-28: 1 mL via INTRAVENOUS

## 2018-09-29 ENCOUNTER — Encounter: Payer: Self-pay | Admitting: Internal Medicine

## 2018-09-29 ENCOUNTER — Ambulatory Visit (INDEPENDENT_AMBULATORY_CARE_PROVIDER_SITE_OTHER): Payer: PPO | Admitting: Internal Medicine

## 2018-09-29 VITALS — BP 100/72 | HR 134 | Ht 68.0 in | Wt 237.0 lb

## 2018-09-29 DIAGNOSIS — I483 Typical atrial flutter: Secondary | ICD-10-CM

## 2018-09-29 NOTE — Progress Notes (Signed)
ELECTROPHYSIOLOGY CONSULT NOTE  Patient ID: Lee Mclaughlin, MRN: [id number], DOB/AGE: November 21, 1951 67 y.o. Admit date: (Not on file) Date of Consult: 09/29/2018  Primary Physician: [name], MD Primary Cardiologist: [facility] Lee Mclaughlin is a 67 y.o. male who is being seen today for the evaluation of Atrial flutter at the request of [name] .   Chief Complaint: Atrial flutter    HPI Lee Mclaughlin is a 67 y.o. male  Referred for atrial flutter.  He has a history of ischemic heart disease with bypass surgery in 2000.  Last catheterization 2008 demonstrated no obstructive disease.  Ejection fraction at that time was 40% but has normalized.  Over the last 3-4 months he has had problems with progressive exercise intolerance.  More recently dyspnea has become more precipitously evident.  No transient neurological symptoms; no palpitations.  He went for cataract surgery 10/19.  Was found to be in tachycardia.  Was referred to the [facility] fib clinic where his was confirmed that he was in atrial flutter-typical.  He was started on Eliquis 10/2.  He has been taking it regularly since then.   DATE TEST EF   2008 LHC  40 %   6/16 MYOVIEW  normal    10/19 Echo  25-30% Apical AK    Thromboembolic risk factors ( age -20, Vasc disease -1, CHF-1*) for a CHADSVASc Score of 3    Past Medical History:  Diagnosis Date  . Coronary artery disease   . Dyslipidemia   . H/O cardiovascular stress test 10/19/2012   MET test - good effort, peak VO2 of almost 112% predicted, HR 79% predicted, mildly ischemia at end of exercise  . History of hyperthyroidism as a child   . History of nuclear stress test 03/2011   bruce myoview; mild perfusion defect in mid anteroseptal, apical septal, apical regions (infarct/scar/breast attenuation); post-stress EF 55%; no signficant ischemia  . Ischemic cardiomyopathy    history of   . Myocardial infarction ([place])   . PONV (postoperative nausea and vomiting)   . Primary  localized osteoarthritis of right knee   . S/P CABG (coronary artery bypass graft) 2000  . 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 Wears glasses       Surgical History:  Past Surgical History:  Procedure Laterality Date  . CARDIAC CATHETERIZATION  06/01/2007   no obstructive CAD, patent grafts, EF 40%, apical hypokinesis, anterolateral hypokinesis (Dr. Gerrie Nordmann)   . CERVICAL SPINE SURGERY    . CHOLECYSTECTOMY    . COLONOSCOPY    . CORONARY ARTERY BYPASS GRAFT  06/05/1999   LIMA to LAD, SVG to diagonal 1, SVG to PDA of Cfx, right radial graft to OM1 (Dr. Remus Loffler)   . ESOPHAGOGASTRODUODENOSCOPY    . EYE SURGERY    . KNEE SURGERY Right    pt. states x 6   . TOTAL KNEE ARTHROPLASTY Right 07/23/2015   Procedure: RIGHT TOTAL KNEE ARTHROPLASTY;  Surgeon: Elsie Saas, MD;  Location: [facility];  Service: Orthopedics;  Laterality: Right;  . TRANSTHORACIC ECHOCARDIOGRAM  02/2008   EF 40%, severe apical wall hypokinesis, mod-severe anterior wall hypokinesis; RV mildly dilated; mild mitral annular calcif; mild TR, RSVP 30-33mHg     Home Meds: Prior to Admission medications   Medication Sig Start Date End Date Taking? Authorizing Provider  apixaban (ELIQUIS) 5 MG TABS tablet Take 1 tablet (5 mg total) by mouth 2 (two) times daily. 09/08/18  Yes CSherran Needs NP  aspirin 81 MG tablet Take 81 mg by mouth daily.   Yes [provider]  cyclobenzaprine (FLEXERIL) 10 MG tablet Take 10 mg by mouth 3 (three) times daily as needed for muscle spasms.   Yes [provider]  fenofibrate 54 MG tablet Take 54 mg by mouth daily.   Yes [provider]  losartan (COZAAR) 100 MG tablet Take 100 mg by mouth daily.   Yes [provider]  metoprolol tartrate (LOPRESSOR) 50 MG tablet Take 100 mg by mouth 2 (two) times daily.   Yes [provider]  PROAIR HFA 108 (90 BASE) MCG/ACT inhaler Inhale 1-2 puffs into the lungs every 6 (six) hours as needed for  wheezing or shortness of breath.  12/12/14  Yes [provider]  rOPINIRole (REQUIP) 1 MG tablet Take 1 mg by mouth 4 (four) times daily.    Yes [provider]  traZODone (DESYREL) 50 MG tablet Take 1 tablet by mouth daily as needed. 11/08/15  Yes [provider]       Allergies:  Allergies  Allergen Reactions  . Morphine And Related Nausea And Vomiting  . Crestor [Rosuvastatin]     Myalgias   . Vytorin [Ezetimibe-Simvastatin]     Myalgias     Social History   Socioeconomic History  . Marital status: Married    Spouse name: Not on file  . Number of children: 2  . Years of education: 52  . Highest education level: Not on file  Occupational History    Employer: [employer]  . Financial resource strain: Not on file  . Food insecurity:    Worry: Not on file    Inability: Not on file  . Transportation needs:    Medical: Not on file    Non-medical: Not on file  Tobacco Use  . Smoking status: Former Smoker    Types: Cigars  . Smokeless tobacco: Never Used  . Tobacco comment: cigar occassionally  Substance and Sexual Activity  . Alcohol use: Yes    Alcohol/week: 0.0 standard drinks    Comment: socially  . Drug use: No  . Sexual activity: Not on file  Lifestyle  . Physical activity:    Days per week: Not on file    Minutes per session: Not on file  . Stress: Not on file  Relationships  . Social connections:    Talks on phone: Not on file    Gets together: Not on file    Attends religious service: Not on file    Active member of club or organization: Not on file    Attends meetings of clubs or organizations: Not on file    Relationship status: Not on file  . Intimate partner violence:    Fear of current or ex partner: Not on file    Emotionally abused: Not on file    Physically abused: Not on file    Forced sexual activity: Not on file  Other Topics Concern  . Not on file  Social History Narrative  . Not on file       Family History  Problem Relation Age of Onset  . CAD Father        s/p CABGx4 at 49  . Hyperlipidemia Father      ROS:  Please see the history of present illness.     All other systems reviewed and negative.    Physical Exam:  Blood pressure 100/72, pulse (!) 134, height '5\' 8"'  (1.727  m), weight 237 lb (107.5 kg), SpO2 94 %. General: Well developed, well nourished male in no acute distress. Head: Normocephalic, atraumatic, sclera non-icteric, no xanthomas, nares are without discharge. EENT: normal Lymph Nodes:  none Back: without scoliosis/kyphosis, no CVA tendersness Neck: Negative for carotid bruits. JVD 8-10 cm  lungs: Clear bilaterally to auscultation without wheezes, rales, or rhonchi. Breathing is unlabored. Heart: Fast but regular rate and rhythm with a 2/6 murmur murmur , rubs, or gallops appreciated. Abdomen: Soft, non-tender, non-distended with normoactive bowel sounds. No hepatomegaly. No rebound/guarding. No obvious abdominal masses. Msk:  Strength and tone appear normal for age. Extremities: No clubbing or cyanosis. 1+ edema.  Distal pedal pulses are 2+ and equal bilaterally. Skin: Warm and Dry Neuro: Alert and oriented X 3. CN III-XII intact Grossly normal sensory and motor function . Psych:  Responds to questions appropriately with a normal affect.      Labs: Cardiac Enzymes No results for input(s): CKTOTAL, CKMB, TROPONINI in the last 72 hours. CBC Lab Results  Component Value Date   WBC 9.1 09/08/2018   HGB 17.1 (H) 09/08/2018   HCT 52.8 (H) 09/08/2018   MCV 92.1 09/08/2018   PLT 154 09/08/2018   PROTIME: No results for input(s): LABPROT, INR in the last 72 hours. Chemistry No results for input(s): NA, K, CL, CO2, BUN, CREATININE, CALCIUM, PROT, BILITOT, ALKPHOS, ALT, AST, GLUCOSE in the last 168 hours.  Invalid input(s): LABALBU Lipids Lab Results  Component Value Date   CHOL 240 (H) 12/14/2014   HDL 46 12/14/2014   LDLCALC 155 (H) 12/14/2014    TRIG 194 (H) 12/14/2014   BNP No results found for: PROBNP Thyroid Function Tests: No results for input(s): TSH, T4TOTAL, T3FREE, THYROIDAB in the last 72 hours.  Invalid input(s): FREET3    Miscellaneous No results found for: DDIMER  Radiology/Studies:  No results found.  EKG: Atrial flutter-typical at 134 Low voltage   Assessment and Plan:  Atrial flutter-typical  Cardiomyopathy-hopefully rate related  CHF acute systolic  Coronary artery disease with prior bypass surgery (2001)  Sleep disordered breathing-- but neg sleep study   The pt has atrial flutter and is not symptomatic heart failure likely as a consequence of both his cardiomyopathy and his rapid rates.  We discussed the importance of rate control with 2 strategies, cardioversion followed by catheter ablation or direct catheter ablation.  He would like to pursue the latter.  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  We have taken the liberty of scheduling him that slot.  Reviewed risks and benefits including but not limited to death perforation heart block requiring pacing and bleeding.  He understands; his wife also expresses understanding and they agreed to proceed.  I have also told him that there is a high likelihood of atrial fibrillation.  He has sleep disordered breathing with a relatively remote sleep study.  Currently has quite restless legs.  Recommended a repeat sleep study for him to discuss with his PCP  In the event that his cardiomyopathy does not improve following restoration of sinus rhythm with further evaluation will be in order; likely will need catheterization.  For now we will continue him on his beta-blockers and losartan and his Eliquis.        Virl Axe

## 2018-09-29 NOTE — Patient Instructions (Signed)
Medication Instructions:  Your physician recommends that you continue on your current medications as directed. Please refer to the Current Medication list given to you today.  Labwork: You will have labs drawn today: CBC and BMP   Testing/Procedures: Your physician has recommended that you have an ablation. Catheter ablation is a medical procedure used to treat some cardiac arrhythmias (irregular heartbeats). During catheter ablation, a long, thin, flexible tube is put into a blood vessel in your groin (upper thigh), or neck. This tube is called an ablation catheter. It is then guided to your heart through the blood vessel. Radio frequency waves destroy small areas of heart tissue where abnormal heartbeats may cause an arrhythmia to start. Please see the instruction sheet given to you today.   Follow-Up:  Your physician recommends that you schedule a follow-up appointment in:   4 week follow up with Dr Ladona Ridgel post ablation.  Any Other Special Instructions Will Be Listed Below (If Applicable).     If you need a refill on your cardiac medications before your next appointment, please call your pharmacy.

## 2018-09-30 LAB — BASIC METABOLIC PANEL
BUN / CREAT RATIO: 24 (ref 10–24)
BUN: 32 mg/dL — ABNORMAL HIGH (ref 8–27)
CO2: 24 mmol/L (ref 20–29)
Calcium: 9.6 mg/dL (ref 8.6–10.2)
Chloride: 106 mmol/L (ref 96–106)
Creatinine, Ser: 1.34 mg/dL — ABNORMAL HIGH (ref 0.76–1.27)
GFR calc Af Amer: 63 mL/min/{1.73_m2} (ref >59)
GFR, EST NON AFRICAN AMERICAN: 55 mL/min/{1.73_m2} — AB (ref >59)
GLUCOSE: 96 mg/dL (ref 65–99)
POTASSIUM: 4.8 mmol/L (ref 3.5–5.2)
SODIUM: 144 mmol/L (ref 134–144)

## 2018-09-30 LAB — CBC
Hematocrit: 46.2 % (ref 37.5–51.0)
Hemoglobin: 16.1 g/dL (ref 13.0–17.7)
MCH: 30.3 pg (ref 26.6–33.0)
MCHC: 34.8 g/dL (ref 31.5–35.7)
MCV: 87 fL (ref 79–97)
PLATELETS: 155 10*3/uL (ref 150–450)
RBC: 5.32 x10E6/uL (ref 4.14–5.80)
RDW: 13.2 % (ref 12.3–15.4)
WBC: 7.2 10*3/uL (ref 3.4–10.8)

## 2018-10-01 ENCOUNTER — Other Ambulatory Visit (HOSPITAL_COMMUNITY): Payer: Self-pay | Admitting: Nurse Practitioner

## 2018-10-01 ENCOUNTER — Encounter (HOSPITAL_COMMUNITY)
Admission: RE | Disposition: A | Discharge status: Home / Self Care | Payer: Self-pay | Source: Ambulatory Visit | Attending: Internal Medicine

## 2018-10-01 ENCOUNTER — Ambulatory Visit (HOSPITAL_COMMUNITY)
Admission: RE | Admit: 2018-10-01 | Discharge: 2018-10-01 | Disposition: A | Discharge status: Home / Self Care | Payer: PPO | Source: Ambulatory Visit | Attending: Internal Medicine | Admitting: Internal Medicine

## 2018-10-01 DIAGNOSIS — E785 Hyperlipidemia, unspecified: Secondary | ICD-10-CM | POA: Insufficient documentation

## 2018-10-01 DIAGNOSIS — Z87891 Personal history of nicotine dependence: Secondary | ICD-10-CM | POA: Insufficient documentation

## 2018-10-01 DIAGNOSIS — G2581 Restless legs syndrome: Secondary | ICD-10-CM | POA: Diagnosis not present

## 2018-10-01 DIAGNOSIS — G4730 Sleep apnea, unspecified: Secondary | ICD-10-CM | POA: Insufficient documentation

## 2018-10-01 DIAGNOSIS — I4891 Unspecified atrial fibrillation: Secondary | ICD-10-CM | POA: Insufficient documentation

## 2018-10-01 DIAGNOSIS — I2510 Atherosclerotic heart disease of native coronary artery without angina pectoris: Secondary | ICD-10-CM | POA: Diagnosis not present

## 2018-10-01 DIAGNOSIS — Z8249 Family history of ischemic heart disease and other diseases of the circulatory system: Secondary | ICD-10-CM | POA: Diagnosis not present

## 2018-10-01 DIAGNOSIS — I483 Typical atrial flutter: Secondary | ICD-10-CM | POA: Diagnosis not present

## 2018-10-01 DIAGNOSIS — Z885 Allergy status to narcotic agent status: Secondary | ICD-10-CM | POA: Insufficient documentation

## 2018-10-01 DIAGNOSIS — Z7901 Long term (current) use of anticoagulants: Secondary | ICD-10-CM | POA: Insufficient documentation

## 2018-10-01 DIAGNOSIS — I252 Old myocardial infarction: Secondary | ICD-10-CM | POA: Insufficient documentation

## 2018-10-01 DIAGNOSIS — Z7982 Long term (current) use of aspirin: Secondary | ICD-10-CM | POA: Diagnosis not present

## 2018-10-01 DIAGNOSIS — I5021 Acute systolic (congestive) heart failure: Secondary | ICD-10-CM | POA: Diagnosis not present

## 2018-10-01 DIAGNOSIS — Z951 Presence of aortocoronary bypass graft: Secondary | ICD-10-CM | POA: Diagnosis not present

## 2018-10-01 DIAGNOSIS — I255 Ischemic cardiomyopathy: Secondary | ICD-10-CM | POA: Insufficient documentation

## 2018-10-01 HISTORY — PX: A-FLUTTER ABLATION: EP1230

## 2018-10-01 SURGERY — A-FLUTTER ABLATION

## 2018-10-01 MED ORDER — SODIUM CHLORIDE 0.9% FLUSH: 3.0000 mL | Freq: Two times a day (BID) | INTRAVENOUS | Status: DC

## 2018-10-01 MED ORDER — SODIUM CHLORIDE 0.9 % IV SOLN: 250.0000 mL | INTRAVENOUS | Status: DC | PRN

## 2018-10-01 MED ORDER — BUPIVACAINE HCL (PF) 0.25 % IJ SOLN
INTRAMUSCULAR | Status: AC
  Filled 2018-10-01: qty 30

## 2018-10-01 MED ORDER — MIDAZOLAM HCL 5 MG/5ML IJ SOLN
INTRAMUSCULAR | Status: AC
  Filled 2018-10-01: qty 5

## 2018-10-01 MED ORDER — FENTANYL CITRATE (PF) 100 MCG/2ML IJ SOLN
INTRAMUSCULAR | Status: AC
  Filled 2018-10-01: qty 2

## 2018-10-01 MED ORDER — FENTANYL CITRATE (PF) 100 MCG/2ML IJ SOLN
INTRAMUSCULAR | Status: DC | PRN
  Administered 2018-10-01 (×7): 25 ug via INTRAVENOUS

## 2018-10-01 MED ORDER — BUPIVACAINE HCL (PF) 0.25 % IJ SOLN
INTRAMUSCULAR | Status: DC | PRN
  Administered 2018-10-01: 30 mL

## 2018-10-01 MED ORDER — HEPARIN (PORCINE) IN NACL 1000-0.9 UT/500ML-% IV SOLN
INTRAVENOUS | Status: AC
  Filled 2018-10-01: qty 500

## 2018-10-01 MED ORDER — ACETAMINOPHEN 325 MG PO TABS
650.0000 mg | ORAL_TABLET | ORAL | Status: DC | PRN
  Filled 2018-10-01: qty 2

## 2018-10-01 MED ORDER — MIDAZOLAM HCL 5 MG/5ML IJ SOLN
INTRAMUSCULAR | Status: DC | PRN
  Administered 2018-10-01 (×4): 2 mg via INTRAVENOUS
  Administered 2018-10-01: 3 mg via INTRAVENOUS
  Administered 2018-10-01: 1 mg via INTRAVENOUS
  Administered 2018-10-01: 2 mg via INTRAVENOUS

## 2018-10-01 MED ORDER — SODIUM CHLORIDE 0.9 % IV SOLN
INTRAVENOUS | Status: DC
  Administered 2018-10-01: 08:00:00 via INTRAVENOUS

## 2018-10-01 MED ORDER — ONDANSETRON HCL 4 MG/2ML IJ SOLN: 4.0000 mg | Freq: Four times a day (QID) | INTRAMUSCULAR | Status: DC | PRN

## 2018-10-01 MED ORDER — SODIUM CHLORIDE 0.9% FLUSH: 3.0000 mL | INTRAVENOUS | Status: DC | PRN

## 2018-10-01 MED ORDER — HEPARIN (PORCINE) IN NACL 1000-0.9 UT/500ML-% IV SOLN
INTRAVENOUS | Status: DC | PRN
  Administered 2018-10-01: 500 mL

## 2018-10-01 SURGICAL SUPPLY — 12 items
BAG SNAP BAND KOVER 36X36 (MISCELLANEOUS) ×2 IMPLANT
CATH BLAZERPRIME XP (ABLATOR) ×2 IMPLANT
CATH DUODECA HALO/ISMUS 7FR (CATHETERS) ×2 IMPLANT
CATH JOSEPH QUAD ALLRED 6F REP (CATHETERS) ×2 IMPLANT
CATH POLARIS X 2.5/5/2.5 DECAP (CATHETERS) ×2 IMPLANT
PACK EP LATEX FREE (CUSTOM PROCEDURE TRAY) ×3
PACK EP LF (CUSTOM PROCEDURE TRAY) ×1 IMPLANT
PAD PRO RADIOLUCENT 2001M-C (PAD) ×3 IMPLANT
SHEATH PINNACLE 6F 10CM (SHEATH) ×2 IMPLANT
SHEATH PINNACLE 7F 10CM (SHEATH) ×2 IMPLANT
SHEATH PINNACLE 8F 10CM (SHEATH) ×4 IMPLANT
SHIELD RADPAD SCOOP 12X17 (MISCELLANEOUS) ×2 IMPLANT

## 2018-10-01 NOTE — Discharge Instructions (Signed)
Post procedure care instructions No driving for 4 days. No lifting over 5 lbs for 1 week. No vigorous or sexual activity for 1 week. You may return to work on 10/08/18. Keep procedure site clean & dry. If you notice increased pain, swelling, bleeding or pus, call/return!  You may shower, but no soaking baths/hot tubs/pools for 1 week.     Cardiac Ablation, Care After This sheet gives you information about how to care for yourself after your procedure. Your health care provider may also give you more specific instructions. If you have problems or questions, contact your health care provider. What can I expect after the procedure? After the procedure, it is common to have:  Bruising around your puncture site.  Tenderness around your puncture site.  Skipped heartbeats.  Tiredness (fatigue).  Follow these instructions at home: Puncture site care  Follow instructions from your health care provider about how to take care of your puncture site. Make sure you: ? Wash your hands with soap and water before you remove your bandage (dressing). If soap and water are not available, use hand sanitizer. ? Dressing may be removed in 24 hours. Then you may shower.  Check your puncture site every day for signs of infection. Check for: ? Redness, swelling, or pain. ? Fluid or blood. If your puncture site starts to bleed, lie down on your back, apply firm pressure to the area, and contact your health care provider. ? Warmth. ? Pus or a bad smell. Driving  Ask your health care provider when it is safe for you to drive again after the procedure.  Do not drive or use heavy machinery while taking prescription pain medicine.  Do not drive for 24 hours if you were given a medicine to help you relax (sedative) during your procedure. Activity  Avoid activities that take a lot of effort for at least 3 days after your procedure.  Do not lift anything that is heavier than 10 lb (4.5 kg), or the limit that  you are told, until your health care provider says that it is safe.  Return to your normal activities as told by your health care provider. Ask your health care provider what activities are safe for you. General instructions  Take over-the-counter and prescription medicines only as told by your health care provider.  Do not use any products that contain nicotine or tobacco, such as cigarettes and e-cigarettes. If you need help quitting, ask your health care provider.  Do not take baths, swim, or use a hot tub until your health care provider approves.  Do not drink alcohol for 24 hours after your procedure.  Keep all follow-up visits as told by your health care provider. This is important. Contact a health care provider if:  You have redness, mild swelling, or pain around your puncture site.  You have fluid or blood coming from your puncture site that stops after applying firm pressure to the area.  Your puncture site feels warm to the touch.  You have pus or a bad smell coming from your puncture site.  You have a fever.  You have chest pain or discomfort that spreads to your neck, jaw, or arm.  You are sweating a lot.  You feel nauseous.  You have a fast or irregular heartbeat.  You have shortness of breath.  You are dizzy or light-headed and feel the need to lie down.  You have pain or numbness in the arm or leg closest to your puncture site.  Get help right away if:  Your puncture site suddenly swells.  Your puncture site is bleeding and the bleeding does not stop after applying firm pressure to the area. These symptoms may represent a serious problem that is an emergency. Do not wait to see if the symptoms will go away. Get medical help right away. Call your local emergency services (911 in the [place] Do not drive yourself to the hospital. Summary  After the procedure, it is normal to have bruising and tenderness at the puncture site in your groin, neck, or  forearm.  Check your puncture site every day for signs of infection.  Get help right away if your puncture site is bleeding and the bleeding does not stop after applying firm pressure to the area. This is a medical emergency. This information is not intended to replace advice given to you by your health care provider. Make sure you discuss any questions you have with your health care provider. Document Released: 03/05/2017 Document Revised: 03/05/2017 Document Reviewed: 03/05/2017 Elsevier Interactive Patient Education  2018 [employer].

## 2018-10-01 NOTE — H&P (Signed)
ELECTROPHYSIOLOGY CONSULT NOTE  Patient ID: Lee Mclaughlin, MRN: [id number], DOB/AGE: [birth year] 67 y.o. Admit date: (Not on file) Date of Consult: 09/29/2018  Primary Physician: [name], MD Primary Cardiologist: [facility] Lee Mclaughlin is a 67 y.o. male who is being seen today for the evaluation of Atrial flutter at the request of [place] .   Chief Complaint: Atrial flutter    HPI Lee Mclaughlin is a 67 y.o. male  Referred for atrial flutter.  He has a history of ischemic heart disease with bypass surgery in 2000.  Last catheterization 2008 demonstrated no obstructive disease.  Ejection fraction at that time was 40% but has normalized.  Over the last 3-4 months he has had problems with progressive exercise intolerance.  More recently dyspnea has become more precipitously evident.  No transient neurological symptoms; no palpitations.  He went for cataract surgery 10/19.  Was found to be in tachycardia.  Was referred to the A. fib clinic where his was confirmed that he was in atrial flutter-typical.  He was started on Eliquis 10/2.  He has been taking it regularly since then.   DATE TEST EF   2008 LHC  40 %   6/16 MYOVIEW  normal    10/19 Echo  25-30% Apical AK    Thromboembolic risk factors ( age -60, Vasc disease -1, CHF-1*) for a CHADSVASc Score of 3    Past Medical History:  Diagnosis Date  . Coronary artery disease   . Dyslipidemia   . H/O cardiovascular stress test 10/19/2012   MET test - good effort, peak VO2 of almost 112% predicted, HR 79% predicted, mildly ischemia at end of exercise  . History of hyperthyroidism as a child   . History of nuclear stress test 03/2011   bruce myoview; mild perfusion defect in mid anteroseptal, apical septal, apical regions (infarct/scar/breast attenuation); post-stress EF 55%; no signficant ischemia  . Ischemic cardiomyopathy    history of   . Myocardial infarction ([place])   . PONV (postoperative nausea and  vomiting)   . Primary localized osteoarthritis of right knee   . S/P CABG (coronary artery bypass graft) 2000  . 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 Wears glasses       Surgical History:       Past Surgical History:  Procedure Laterality Date  . CARDIAC CATHETERIZATION  06/01/2007   no obstructive CAD, patent grafts, EF 40%, apical hypokinesis, anterolateral hypokinesis (Dr. Gerrie Nordmann)   . CERVICAL SPINE SURGERY    . CHOLECYSTECTOMY    . COLONOSCOPY    . CORONARY ARTERY BYPASS GRAFT  06/05/1999   LIMA to LAD, SVG to diagonal 1, SVG to PDA of Cfx, right radial graft to OM1 (Dr. Remus Loffler)   . ESOPHAGOGASTRODUODENOSCOPY    . EYE SURGERY    . KNEE SURGERY Right    pt. states x 6   . TOTAL KNEE ARTHROPLASTY Right 07/23/2015   Procedure: RIGHT TOTAL KNEE ARTHROPLASTY;  Surgeon: Elsie Saas, MD;  Location: [facility];  Service: Orthopedics;  Laterality: Right;  . TRANSTHORACIC ECHOCARDIOGRAM  02/2008   EF 40%, severe apical wall hypokinesis, mod-severe anterior wall hypokinesis; RV mildly dilated; mild mitral annular calcif; mild TR, RSVP 30-75mHg     Home Meds:        Prior to Admission medications   Medication Sig Start Date End Date Taking? Authorizing Provider  apixaban (ELIQUIS) 5 MG TABS tablet Take 1 tablet (5 mg total) by  mouth 2 (two) times daily. 09/08/18  Yes Sherran Needs, NP  aspirin 81 MG tablet Take 81 mg by mouth daily.   Yes [provider]  cyclobenzaprine (FLEXERIL) 10 MG tablet Take 10 mg by mouth 3 (three) times daily as needed for muscle spasms.   Yes [provider]  fenofibrate 54 MG tablet Take 54 mg by mouth daily.   Yes [provider]  losartan (COZAAR) 100 MG tablet Take 100 mg by mouth daily.   Yes [provider]  metoprolol tartrate (LOPRESSOR) 50 MG tablet Take 100 mg by mouth 2 (two) times daily.   Yes [provider]  PROAIR HFA 108 (90 BASE) MCG/ACT inhaler  Inhale 1-2 puffs into the lungs every 6 (six) hours as needed for wheezing or shortness of breath.  12/12/14  Yes [provider]  rOPINIRole (REQUIP) 1 MG tablet Take 1 mg by mouth 4 (four) times daily.    Yes [provider]  traZODone (DESYREL) 50 MG tablet Take 1 tablet by mouth daily as needed. 11/08/15  Yes [provider]       Allergies:       Allergies  Allergen Reactions  . Morphine And Related Nausea And Vomiting  . Crestor [Rosuvastatin]     Myalgias   . Vytorin [Ezetimibe-Simvastatin]     Myalgias     Social History        Socioeconomic History  . Marital status: Married    Spouse name: Not on file  . Number of children: 2  . Years of education: 55  . Highest education level: Not on file  Occupational History    Employer: [employer]  . Financial resource strain: Not on file  . Food insecurity:    Worry: Not on file    Inability: Not on file  . Transportation needs:    Medical: Not on file    Non-medical: Not on file  Tobacco Use  . Smoking status: Former Smoker    Types: Cigars  . Smokeless tobacco: Never Used  . Tobacco comment: cigar occassionally  Substance and Sexual Activity  . Alcohol use: Yes    Alcohol/week: 0.0 standard drinks    Comment: socially  . Drug use: No  . Sexual activity: Not on file  Lifestyle  . Physical activity:    Days per week: Not on file    Minutes per session: Not on file  . Stress: Not on file  Relationships  . Social connections:    Talks on phone: Not on file    Gets together: Not on file    Attends religious service: Not on file    Active member of club or organization: Not on file    Attends meetings of clubs or organizations: Not on file    Relationship status: Not on file  . Intimate partner violence:    Fear of current or ex partner: Not on file    Emotionally abused: Not on file    Physically  abused: Not on file    Forced sexual activity: Not on file  Other Topics Concern  . Not on file  Social History Narrative  . Not on file          Family History  Problem Relation Age of Onset  . CAD Father        s/p CABGx4 at 28  . Hyperlipidemia Father      ROS:  Please see the history of present  illness.     All other systems reviewed and negative.    Physical Exam:  Blood pressure 100/72, pulse (!) 134, height '5\' 8"'  (1.727 m), weight 237 lb (107.5 kg), SpO2 94 %. General: Well developed, well nourished male in no acute distress. Head: Normocephalic, atraumatic, sclera non-icteric, no xanthomas, nares are without discharge. EENT: normal Lymph Nodes:  none Back: without scoliosis/kyphosis, no CVA tendersness Neck: Negative for carotid bruits. JVD 8-10 cm  lungs: Clear bilaterally to auscultation without wheezes, rales, or rhonchi. Breathing is unlabored. Heart: Fast but regular rate and rhythm with a 2/6 murmur murmur , rubs, or gallops appreciated. Abdomen: Soft, non-tender, non-distended with normoactive bowel sounds. No hepatomegaly. No rebound/guarding. No obvious abdominal masses. Msk:  Strength and tone appear normal for age. Extremities: No clubbing or cyanosis. 1+ edema.  Distal pedal pulses are 2+ and equal bilaterally. Skin: Warm and Dry Neuro: Alert and oriented X 3. CN III-XII intact Grossly normal sensory and motor function . Psych:  Responds to questions appropriately with a normal affect.                 Labs: Cardiac Enzymes RecentLabs(last2labs)  No results for input(s): CKTOTAL, CKMB, TROPONINI in the last 72 hours.   CBC RecentLabs       Lab Results  Component Value Date   WBC 9.1 09/08/2018   HGB 17.1 (H) 09/08/2018   HCT 52.8 (H) 09/08/2018   MCV 92.1 09/08/2018   PLT 154 09/08/2018     PROTIME: RecentLabs(last2labs)  No results for input(s): LABPROT, INR in the last 72 hours.   Chemistry  LastLabs  No  results for input(s): NA, K, CL, CO2, BUN, CREATININE, CALCIUM, PROT, BILITOT, ALKPHOS, ALT, AST, GLUCOSE in the last 168 hours.  Invalid input(s): LABALBU   Lipids RecentLabs       Lab Results  Component Value Date   CHOL 240 (H) 12/14/2014   HDL 46 12/14/2014   LDLCALC 155 (H) 12/14/2014   TRIG 194 (H) 12/14/2014     BNP LastLabs  No results found for: PROBNP   Thyroid Function Tests:  RecentLabs(last2labs)  No results for input(s): TSH, T4TOTAL, T3FREE, THYROIDAB in the last 72 hours.  Invalid input(s): FREET3      Miscellaneous RecentLabs  No results found for: DDIMER    Radiology/Studies:  ImagingResults  No results found.    EKG: Atrial flutter-typical at 134 Low voltage   Assessment and Plan:  Atrial flutter-typical  Cardiomyopathy-hopefully rate related  CHF acute systolic  Coronary artery disease with prior bypass surgery (2001)  Sleep disordered breathing-- but neg sleep study   The pt has atrial flutter and is not symptomatic heart failure likely as a consequence of both his cardiomyopathy and his rapid rates.  We discussed the importance of rate control with 2 strategies, cardioversion followed by catheter ablation or direct catheter ablation.  He would like to pursue the latter.  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  We have taken the liberty of scheduling him that slot.  Reviewed risks and benefits including but not limited to death perforation heart block requiring pacing and bleeding.  He understands; his wife also expresses understanding and they agreed to proceed.  I have also told him that there is a high likelihood of atrial fibrillation.  He has sleep disordered breathing with a relatively remote sleep study.  Currently has quite restless legs.  Recommended a repeat sleep study for him to  discuss with his PCP  In the event that his cardiomyopathy does not improve  following restoration of sinus rhythm with further evaluation will be in order; likely will need catheterization.  For now we will continue him on his beta-blockers and losartan and his Eliquis.   Virl Axe  EP Attending  Patient seen and examined. He has typical atrial flutter and appears to have a tachy induced CM. I have discussed the treatment options with the patient and his wife and the risks/benefits/goals/expectations of EP study and catheter ablation were reviewed and he wishes to proceed.  Mikle Bosworth.D.

## 2018-10-01 NOTE — Progress Notes (Signed)
Dr. Ladona Ridgel was called to clarify patients medication orders.  Dr Ladona Ridgel has instructed pt to take the Eliquis tonight and continue as ordered.  He wants pt to restart his carvedilol and stop his metoprolol.  Pt and wife given instructions verbally and in writing.

## 2018-10-04 ENCOUNTER — Encounter (HOSPITAL_COMMUNITY): Payer: Self-pay | Admitting: Internal Medicine

## 2018-10-05 ENCOUNTER — Telehealth: Payer: Self-pay | Admitting: Internal Medicine

## 2018-10-05 NOTE — Telephone Encounter (Signed)
Called patient, advised of note from MD. Patient verbalized understanding.  

## 2018-10-05 NOTE — Telephone Encounter (Signed)
Patient called in and would like to know when he can start his normal activities again like golfing, he states he feels great, and his HR has been 70-90 on a normal basis.   Please advise. Thank you!

## 2018-10-05 NOTE — Telephone Encounter (Signed)
Ok to resume golfing and non-strenuous activities. No heavy lifting until follow-up with EP in November.  Dr. Rexene Edison

## 2018-10-05 NOTE — Telephone Encounter (Signed)
° ° °  Patient would like to know when he can resume normal activity (golf), following ablation.  Also advised patient to have surgeons office call regarding an upcoming eye surgery to complete clearance questions.

## 2018-10-06 ENCOUNTER — Encounter: Payer: Self-pay | Admitting: Internal Medicine

## 2018-10-13 ENCOUNTER — Telehealth: Payer: Self-pay | Admitting: Cardiology

## 2018-10-13 NOTE — Telephone Encounter (Signed)
Received page from patient.  He had an atrial flutter ablation 1 week ago, and has been doing well, but noticed that his heart rates were jumping up as high as the 140s on his apple watch.  He has been completely asymptomatic with this.  He in particular denies chest pains, shortness of breath, palpitations, lightheadedness, or syncope.  He has been taking carvedilol 12.5 mg twice daily and Eliquis.  He was just recently switched from metoprolol to carvedilol.  As he still has metoprolol tablets at home, I advised him to take a 50 mg tablet of metoprolol this evening.  I advised him to present to the emergency department if he has any of the warning symptoms above.  If not, he will plan to call the office first thing in the morning and present for an EKG and further evaluation.  The patient voiced understanding of the plan and is in agreement.

## 2018-10-14 ENCOUNTER — Ambulatory Visit (INDEPENDENT_AMBULATORY_CARE_PROVIDER_SITE_OTHER): Payer: PPO | Admitting: Cardiology

## 2018-10-14 ENCOUNTER — Telehealth: Payer: Self-pay | Admitting: Internal Medicine

## 2018-10-14 ENCOUNTER — Encounter: Payer: Self-pay | Admitting: Cardiology

## 2018-10-14 VITALS — BP 112/74 | HR 71 | Ht 68.0 in | Wt 231.8 lb

## 2018-10-14 DIAGNOSIS — I483 Typical atrial flutter: Secondary | ICD-10-CM

## 2018-10-14 DIAGNOSIS — I255 Ischemic cardiomyopathy: Secondary | ICD-10-CM | POA: Diagnosis not present

## 2018-10-14 NOTE — Progress Notes (Signed)
Cardiology Office Note:    Date:  10/14/2018   ID:  Lee Mclaughlin, DOB [birth year], MRN [id number]  PCP:  [name], MD  Cardiologist:  Pixie Casino, MD  Referring MD: [name], MD   Chief Complaint  Patient presents with  . Atrial Flutter    History of Present Illness:    Lee Mclaughlin is a 67 y.o. male with a past medical history significant for coronary disease, status post CABG x 4 in 2000 (LIMA to LAD, SVG to T1, SVG to PDA (left dominant), free radial graft to OM1. His heart cath in 2008 showed no obstructive coronary artery disease after his nuclear stress test was negative. EF was 40%; however, came back to normal in 2012.  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 He is starting to do some walking now and playing golf about twice a week.   He was seen in the office on 09/24/2018 by Dr. Debara Pickett after being found to have tachycardia while undergoing cataract surgery.  EKG demonstrated atrial flutter with rapid ventricular response.  He was generally unaware.  He had been seen in the A. fib clinic by Roderic Palau and started on Eliquis on October 2.  Metoprolol was increased to 75 mg twice daily to improve heart rate control.  He was followed up by Dr. Caryl Comes his typical A. Flutter.   Patient underwent a flutter ablation on 10/01/2018 with successful radiofrequency ablation of atrial flutter along the cavotricuspid isthmus with complete bidirectional isthmus block achieved. He had no inducible arrhythmias following ablation.   The patient is here today for an acute visit for rapid heartbeat. He says his heart rate has been fine after ablation until last night per his Fit Bit when his HR went up to 143 and was up for about 45 minutes. He had no symptoms. He called that after hours call and was told to take an extra metoprolol. He  relaxed and did deep breathing with an app on his apple watch and it came down. Today in the office he is in sinus rhtyhm at 72. He is feeling well.   He says that he was probably in rapid aflutter for up to several months as his fit fit showed HR in the 140's for some time.   He has appt with Dr. Lovena Le on 10/29/18.  Past Medical History:  Diagnosis Date  . Coronary artery disease   . Dyslipidemia   . H/O cardiovascular stress test 10/19/2012   MET test - good effort, peak VO2 of almost 112% predicted, HR 79% predicted, mildly ischemia at end of exercise  . History of hyperthyroidism as a child   . History of nuclear stress test 03/2011   bruce myoview; mild perfusion defect in mid anteroseptal, apical septal, apical regions (infarct/scar/breast attenuation); post-stress EF 55%; no signficant ischemia  . Ischemic cardiomyopathy    history of   . Myocardial infarction ([place])   . PONV (postoperative nausea and vomiting)   . Primary localized osteoarthritis of right knee   . S/P CABG (coronary artery bypass graft) 2000  . 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 Wears glasses     Past Surgical History:  Procedure Laterality Date  . A-FLUTTER ABLATION N/A 10/01/2018   Procedure: A-FLUTTER ABLATION;  Surgeon: Evans Lance, MD;  Location: [facility]  INVASIVE CV LAB;  Service: Cardiovascular;  Laterality: N/A;  . CARDIAC CATHETERIZATION  06/01/2007   no obstructive CAD, patent grafts, EF 40%, apical hypokinesis, anterolateral hypokinesis (Dr. Gerrie Nordmann)   . CERVICAL SPINE SURGERY    . CHOLECYSTECTOMY    . COLONOSCOPY    . CORONARY ARTERY BYPASS GRAFT  06/05/1999   LIMA to LAD, SVG to diagonal 1, SVG to PDA of Cfx, right radial graft to OM1 (Dr. Remus Loffler)   . ESOPHAGOGASTRODUODENOSCOPY    . EYE SURGERY    . KNEE SURGERY Right    pt. states x 6   . TOTAL KNEE ARTHROPLASTY Right 07/23/2015   Procedure: RIGHT TOTAL KNEE ARTHROPLASTY;  Surgeon: Elsie Saas, MD;  Location: [facility];   Service: Orthopedics;  Laterality: Right;  . TRANSTHORACIC ECHOCARDIOGRAM  02/2008   EF 40%, severe apical wall hypokinesis, mod-severe anterior wall hypokinesis; RV mildly dilated; mild mitral annular calcif; mild TR, RSVP 30-9mHg    Current Medications: Current Meds  Medication Sig  . aspirin 81 MG tablet Take 81 mg by mouth daily.  . carvedilol (COREG) 12.5 MG tablet Take 12.5 mg by mouth 2 (two) times daily with a meal.  . cyclobenzaprine (FLEXERIL) 10 MG tablet Take 10 mg by mouth 3 (three) times daily as needed for muscle spasms.  .Marland KitchenELIQUIS 5 MG TABS tablet TAKE 1 TABLET BY MOUTH TWICE A DAY  . fenofibrate 54 MG tablet Take 54 mg by mouth daily.  .Marland Kitchenlosartan (COZAAR) 100 MG tablet Take 100 mg by mouth daily.  .Marland KitchenPROAIR HFA 108 (90 BASE) MCG/ACT inhaler Inhale 1-2 puffs into the lungs every 6 (six) hours as needed for wheezing or shortness of breath.   .Marland KitchenrOPINIRole (REQUIP) 1 MG tablet Take 1 mg by mouth 6 (six) times daily.   . traZODone (DESYREL) 50 MG tablet Take 1 tablet by mouth at bedtime as needed for sleep.      Allergies:   Morphine and related; Crestor [rosuvastatin]; and Vytorin [ezetimibe-simvastatin]   Social History   Socioeconomic History  . Marital status: Married    Spouse name: Not on file  . Number of children: 2  . Years of education: 125 . Highest education level: Not on file  Occupational History    Employer: [employer]  . Financial resource strain: Not on file  . Food insecurity:    Worry: Not on file    Inability: Not on file  . Transportation needs:    Medical: Not on file    Non-medical: Not on file  Tobacco Use  . Smoking status: Former Smoker    Types: Cigars  . Smokeless tobacco: Never Used  . Tobacco comment: cigar occassionally  Substance and Sexual Activity  . Alcohol use: Yes    Alcohol/week: 0.0 standard drinks    Comment: socially  . Drug use: No  . Sexual activity: Not on file  Lifestyle  . Physical  activity:    Days per week: Not on file    Minutes per session: Not on file  . Stress: Not on file  Relationships  . Social connections:    Talks on phone: Not on file    Gets together: Not on file    Attends religious service: Not on file    Active member of club or organization: Not on file    Attends meetings of clubs or organizations: Not on file    Relationship status: Not on file  Other Topics Concern  .  Not on file  Social History Narrative  . Not on file     Family History: The patient's family history includes CAD in his father; Hyperlipidemia in his father. ROS:   Please see the history of present illness.     All other systems reviewed and are negative.  EKGs/Labs/Other Studies Reviewed:    The following studies were reviewed today:  A-FLUTTER ABLATION  10/01/18   CONCLUSIONS:  1. Isthmus-dependent right atrial flutter upon presentation.  2. Successful radiofrequency ablation of atrial flutter along the cavotricuspid isthmus with complete bidirectional isthmus block achieved.  3. No inducible arrhythmias following ablation.  4. No early apparent complications.   Cristopher Peru, MD  10:30 AM 10/01/2018   Echocardiogram 09/28/2018 Study Conclusions - Procedure narrative: Transthoracic echocardiography. Image   quality was adequate. Intravenous contrast (Definity) was   administered to opacify the LV. - Left ventricle: The cavity size was normal. Systolic function was   severely reduced. The estimated ejection fraction was in the   range of 25% to 30%. Akinesis of the apicalanteroseptal and   apical myocardium. - Pulmonary arteries: Systolic pressure was mildly to moderately   increased. PA peak pressure: 39 mm Hg (S).  EKG:  EKG is ordered today.  The ekg ordered today demonstrates sinus rhythm with first-degree AV block, PRI 230.  71 bpm  Recent Labs: 09/08/2018: TSH 3.754 09/29/2018: BUN 32; Creatinine, Ser 1.34; Hemoglobin 16.1; Platelets 155;  Potassium 4.8; Sodium 144   Recent Lipid Panel    Component Value Date/Time   CHOL 240 (H) 12/14/2014 0844   TRIG 194 (H) 12/14/2014 0844   HDL 46 12/14/2014 0844   LDLCALC 155 (H) 12/14/2014 0844    Physical Exam:    VS:  BP 112/74   Pulse 71   Ht 5' 8" (1.727 m)   Wt 231 lb 12.8 oz (105.1 kg)   SpO2 96%   BMI 35.25 kg/m     Wt Readings from Last 3 Encounters:  10/14/18 231 lb 12.8 oz (105.1 kg)  10/01/18 235 lb (106.6 kg)  09/29/18 237 lb (107.5 kg)     Physical Exam  Constitutional: He is oriented to person, place, and time. He appears well-developed and well-nourished. No distress.  HENT:  Head: Normocephalic and atraumatic.  Neck: Normal range of motion. Neck supple. No JVD present.  Cardiovascular: Normal rate, regular rhythm, normal heart sounds and intact distal pulses. Exam reveals no gallop and no friction rub.  No murmur heard. Pulmonary/Chest: Effort normal and breath sounds normal. No respiratory distress. He has no wheezes. He has no rales.  Abdominal: Soft.  Musculoskeletal: Normal range of motion. He exhibits no edema or deformity.  Neurological: He is alert and oriented to person, place, and time.  Skin: Skin is warm and dry.  Psychiatric: He has a normal mood and affect. His behavior is normal. Judgment and thought content normal.  Vitals reviewed.    ASSESSMENT:    1. Typical atrial flutter (HCC)   2. Cardiomyopathy, ischemic    PLAN:    In order of problems listed above:  Typical atrial flutter: Here today for acute visit for uncontrolled heart rate.  Currently on beta-blocker for rate control Patient underwent a flutter ablation on 10/01/2018 Currently on metoprolol tartrate 100 mg twice daily. Had fast HR last night on Fit Bit to 140's for about 45 minutes. Pt called after hours number and was told to take an extra metoprolol. He did relaxation breathing with an app on his  apple watch and his HR came back down.  -Advised to do the same if  it reoccurs. Also reviewed valsalva maneuver and said he could try it once if needed.   CHA2DS2/VAS Stroke Risk Score is 3 (CHF, Vasc Dz, age). Continues on Eliquis 5 mg BID.   Follow up with Dr. Lovena Le as scheduled on 11/22.  Cardiomyopathy: possibly related to tachyarrhythmia that was likely present for quite some time. Plan to re-evaluate once maintaining SR. No S/S of volume overload.    Recommended repeat sleep study, patient was to addressed with his PCP    Medication Adjustments/Labs and Tests Ordered: Current medicines are reviewed at length with the patient today.  Concerns regarding medicines are outlined above. Labs and tests ordered and medication changes are outlined in the patient instructions below:  Patient Instructions  Medication Instructions:  Your physician recommends that you continue on your current medications as directed. Please refer to the Current Medication list given to you today.  If you need a refill on your cardiac medications before your next appointment, please call your pharmacy.   Lab work: None  If you have labs (blood work) drawn today and your tests are completely normal, you will receive your results only by: Marland Kitchen MyChart Message (if you have MyChart) OR . A paper copy in the mail If you have any lab test that is abnormal or we need to change your treatment, we will call you to review the results.  Testing/Procedures: None  Follow-Up: Keep follow up appointment with Dr. Lovena Le on 10/29/18 @ 8:30 AM  Any Other Special Instructions Will Be Listed Below (If Applicable).  Do Valsalva if fast heartbeat . Relax, take deep breaths. If your heart rate stays up you may take an extra Metoprolol.       Signed, Daune Perch, NP  10/14/2018 5:45 PM    [facility]

## 2018-10-14 NOTE — Telephone Encounter (Signed)
Returned call to patient, patient states last night his heart rate increased to 140.  He has been monitoring his heart rate on his apple watch and it has been running 60-90s until last night.   He called the on call provider last night and he instructed him to take an extra metoprolol, unsure if this helped as he feel asleep.   This morning he remains asymptomatic.  HR jumping from 77-126.    He states on call provider recommended he come in for EKG this AM.    Advised would send message to [address] [facility] to arrange EKG nurse visit today as I am unable to schedule this.    Patient aware someone will call soon to arrange.   Patient verbalized understanding.    Hx: Aflutter ablation 10/25 with Dr. Ladona Ridgel.

## 2018-10-14 NOTE — Telephone Encounter (Signed)
Patient c/o Palpitations:  High priority if patient c/o lightheadedness, shortness of breath, or chest pain  1) How long have you had palpitations/irregular HR/ Afib? Are you having the symptoms now? No not now, last night HR140  2) Are you currently experiencing lightheadedness, SOB or CP? No  3) Do you have a history of afib (atrial fibrillation) or irregular heart rhythm? No  4) Have you checked your BP or HR? (document readings if available): Now HR 70  5) Are you experiencing any other symptoms? No

## 2018-10-14 NOTE — Patient Instructions (Signed)
Medication Instructions:  Your physician recommends that you continue on your current medications as directed. Please refer to the Current Medication list given to you today.  If you need a refill on your cardiac medications before your next appointment, please call your pharmacy.   Lab work: None  If you have labs (blood work) drawn today and your tests are completely normal, you will receive your results only by: Marland Kitchen MyChart Message (if you have MyChart) OR . A paper copy in the mail If you have any lab test that is abnormal or we need to change your treatment, we will call you to review the results.  Testing/Procedures: None  Follow-Up: Keep follow up appointment with Dr. Ladona Ridgel on 10/29/18 @ 8:30 AM  Any Other Special Instructions Will Be Listed Below (If Applicable).  Do Valsalva if fast heartbeat . Relax, take deep breaths. If your heart rate stays up you may take an extra Metoprolol.

## 2018-10-14 NOTE — Telephone Encounter (Signed)
I spoke with pt and scheduled him to see Berton Bon, NP at 11:00 today.

## 2018-10-18 NOTE — Addendum Note (Signed)
Addended by: Cleda Mccreedy on: 10/18/2018 09:41 AM   Modules accepted: Orders

## 2018-10-25 ENCOUNTER — Telehealth: Payer: Self-pay | Admitting: Internal Medicine

## 2018-10-25 NOTE — Telephone Encounter (Signed)
STAT if HR is under 50 or over 120 (normal HR is 60-100 beats per minute)  What is your heart rate? 132   1) Do you have a log of your heart rate readings (document readings)?   no    2) Do you have any other symptoms?    no

## 2018-10-25 NOTE — Telephone Encounter (Signed)
Spoke with Pt.  Pt concerned that his heart rate has continued to be elevated post ablation.  Pt rescheduled to see Dr. Ladona Ridgelaylor tomorrow.

## 2018-10-26 ENCOUNTER — Encounter: Payer: Self-pay | Admitting: Internal Medicine

## 2018-10-26 ENCOUNTER — Ambulatory Visit: Payer: PPO | Admitting: Internal Medicine

## 2018-10-26 ENCOUNTER — Telehealth: Payer: Self-pay | Admitting: Internal Medicine

## 2018-10-26 VITALS — BP 110/74 | HR 126 | Ht 68.0 in | Wt 227.4 lb

## 2018-10-26 DIAGNOSIS — I483 Typical atrial flutter: Secondary | ICD-10-CM

## 2018-10-26 LAB — CBC WITH DIFFERENTIAL/PLATELET
BASOS ABS: 0.1 10*3/uL (ref 0.0–0.2)
Basos: 1 %
EOS (ABSOLUTE): 0.4 10*3/uL (ref 0.0–0.4)
Eos: 5 %
Hematocrit: 51.1 % — ABNORMAL HIGH (ref 37.5–51.0)
Hemoglobin: 17.5 g/dL (ref 13.0–17.7)
IMMATURE GRANS (ABS): 0 10*3/uL (ref 0.0–0.1)
IMMATURE GRANULOCYTES: 0 %
LYMPHS: 38 %
Lymphocytes Absolute: 2.8 10*3/uL (ref 0.7–3.1)
MCH: 29.3 pg (ref 26.6–33.0)
MCHC: 34.2 g/dL (ref 31.5–35.7)
MCV: 86 fL (ref 79–97)
MONOS ABS: 0.6 10*3/uL (ref 0.1–0.9)
Monocytes: 9 %
NEUTROS PCT: 47 %
Neutrophils Absolute: 3.5 10*3/uL (ref 1.4–7.0)
PLATELETS: 190 10*3/uL (ref 150–450)
RBC: 5.97 x10E6/uL — ABNORMAL HIGH (ref 4.14–5.80)
RDW: 13.1 % (ref 12.3–15.4)
WBC: 7.3 10*3/uL (ref 3.4–10.8)

## 2018-10-26 LAB — BASIC METABOLIC PANEL
BUN/Creatinine Ratio: 33 — ABNORMAL HIGH (ref 10–24)
BUN: 42 mg/dL — ABNORMAL HIGH (ref 8–27)
CALCIUM: 10.5 mg/dL — AB (ref 8.6–10.2)
CHLORIDE: 97 mmol/L (ref 96–106)
CO2: 22 mmol/L (ref 20–29)
Creatinine, Ser: 1.26 mg/dL (ref 0.76–1.27)
GFR calc Af Amer: 68 mL/min/{1.73_m2} (ref >59)
GFR calc non Af Amer: 59 mL/min/{1.73_m2} — ABNORMAL LOW (ref >59)
GLUCOSE: 114 mg/dL — AB (ref 65–99)
POTASSIUM: 4.6 mmol/L (ref 3.5–5.2)
SODIUM: 136 mmol/L (ref 134–144)

## 2018-10-26 NOTE — Telephone Encounter (Signed)
Returned call to Pt.  Advised his wife did not need to be present during his procedure.

## 2018-10-26 NOTE — Progress Notes (Signed)
HPI [name] returns today for followup of his atrial flutter. He underwent a fairly difficult flutter ablation just over a month ago. He developed recurrent increased heart rate (he has an apple watch) and was found to be back in atrial flutter. He has minimal palpitations.  Allergies  Allergen Reactions  . Morphine And Related Nausea And Vomiting  . Crestor [Rosuvastatin]     Myalgias   . Vytorin [Ezetimibe-Simvastatin]     Myalgias      Current Outpatient Medications  Medication Sig Dispense Refill  . aspirin 81 MG tablet Take 81 mg by mouth daily.    . carvedilol (COREG) 12.5 MG tablet Take 12.5 mg by mouth 2 (two) times daily with a meal.    . cyclobenzaprine (FLEXERIL) 10 MG tablet Take 10 mg by mouth 3 (three) times daily as needed for muscle spasms.    Marland Kitchen ELIQUIS 5 MG TABS tablet TAKE 1 TABLET BY MOUTH TWICE A DAY 60 tablet 3  . fenofibrate 54 MG tablet Take 54 mg by mouth daily.    Marland Kitchen losartan (COZAAR) 100 MG tablet Take 100 mg by mouth daily.    Marland Kitchen PROAIR HFA 108 (90 BASE) MCG/ACT inhaler Inhale 1-2 puffs into the lungs every 6 (six) hours as needed for wheezing or shortness of breath.     Marland Kitchen rOPINIRole (REQUIP) 1 MG tablet Take 1 mg by mouth 6 (six) times daily.     . traZODone (DESYREL) 50 MG tablet Take 1 tablet by mouth at bedtime as needed for sleep.   0   No current facility-administered medications for this visit.      Past Medical History:  Diagnosis Date  . Coronary artery disease   . Dyslipidemia   . H/O cardiovascular stress test 10/19/2012   MET test - good effort, peak VO2 of almost 112% predicted, HR 79% predicted, mildly ischemia at end of exercise  . History of hyperthyroidism as a child   . History of nuclear stress test 03/2011   bruce myoview; mild perfusion defect in mid anteroseptal, apical septal, apical regions (infarct/scar/breast attenuation); post-stress EF 55%; no signficant ischemia  . Ischemic cardiomyopathy    history of   .  Myocardial infarction ([place])   . PONV (postoperative nausea and vomiting)   . Primary localized osteoarthritis of right knee   . S/P CABG (coronary artery bypass graft) 2000  . 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 Wears glasses     ROS:   All systems reviewed and negative except as noted in the HPI.   Past Surgical History:  Procedure Laterality Date  . A-FLUTTER ABLATION N/A 10/01/2018   Procedure: A-FLUTTER ABLATION;  Surgeon: Evans Lance, MD;  Location: [facility];  Service: Cardiovascular;  Laterality: N/A;  . CARDIAC CATHETERIZATION  06/01/2007   no obstructive CAD, patent grafts, EF 40%, apical hypokinesis, anterolateral hypokinesis (Dr. Gerrie Nordmann)   . CERVICAL SPINE SURGERY    . CHOLECYSTECTOMY    . COLONOSCOPY    . CORONARY ARTERY BYPASS GRAFT  06/05/1999   LIMA to LAD, SVG to diagonal 1, SVG to PDA of Cfx, right radial graft to OM1 (Dr. Remus Loffler)   . ESOPHAGOGASTRODUODENOSCOPY    . EYE SURGERY    . KNEE SURGERY Right    pt. states x 6   . TOTAL KNEE ARTHROPLASTY Right 07/23/2015   Procedure: RIGHT TOTAL KNEE ARTHROPLASTY;  Surgeon: Elsie Saas, MD;  Location: [facility];  Service: Orthopedics;  Laterality: Right;  . TRANSTHORACIC ECHOCARDIOGRAM  02/2008   EF 40%, severe apical wall hypokinesis, mod-severe anterior wall hypokinesis; RV mildly dilated; mild mitral annular calcif; mild TR, RSVP 30-46mHg     Family History  Problem Relation Age of Onset  . CAD Father        s/p CABGx4 at 738 . Hyperlipidemia Father      Social History   Socioeconomic History  . Marital status: Married    Spouse name: Not on file  . Number of children: 2  . Years of education: 186 . Highest education level: Not on file  Occupational History    Employer: [employer]  . Financial resource strain: Not on file  . Food insecurity:    Worry: Not on file    Inability: Not on file  . Transportation needs:    Medical: Not on file     Non-medical: Not on file  Tobacco Use  . Smoking status: Former Smoker    Types: Cigars  . Smokeless tobacco: Never Used  . Tobacco comment: cigar occassionally  Substance and Sexual Activity  . Alcohol use: Yes    Alcohol/week: 0.0 standard drinks    Comment: socially  . Drug use: No  . Sexual activity: Not on file  Lifestyle  . Physical activity:    Days per week: Not on file    Minutes per session: Not on file  . Stress: Not on file  Relationships  . Social connections:    Talks on phone: Not on file    Gets together: Not on file    Attends religious service: Not on file    Active member of club or organization: Not on file    Attends meetings of clubs or organizations: Not on file    Relationship status: Not on file  . Intimate partner violence:    Fear of current or ex partner: Not on file    Emotionally abused: Not on file    Physically abused: Not on file    Forced sexual activity: Not on file  Other Topics Concern  . Not on file  Social History Narrative  . Not on file     BP 110/74   Pulse (!) 126   Ht '5\' 8"'  (1.727 m)   Wt 227 lb 6.4 oz (103.1 kg)   SpO2 98%   BMI 34.58 kg/m   Physical Exam:  Well appearing NAD HEENT: Unremarkable Neck:  No JVD, no thyromegally Lymphatics:  No adenopathy Back:  No CVA tenderness Lungs:  Clear with no wheezes HEART:  Regular tachy rhythm, no murmurs, no rubs, no clicks Abd:  soft, positive bowel sounds, no organomegally, no rebound, no guarding Ext:  2 plus pulses, no edema, no cyanosis, no clubbing Skin:  No rashes no nodules Neuro:  CN II through XII intact, motor grossly intact  EKG - atrial flutter with 2:1 AV conduction    Assess/Plan: 1. Recurrent atrial flutter - he has had recurrent conduction. Review of his procedure note suggests that he was difficult to sedate with 14 of versed and almost 200 of fentanyl. I will schedule repeat ablation with anesthesia and irrigated RF. 2. Coags - he will continue  his systemic anti-coagulation. He is instructed not to miss his Eliquis.  GMikle BosworthD.

## 2018-10-26 NOTE — Telephone Encounter (Signed)
Patient is calling stating he is having an ablation done. He wants to know if his wife has to be there the entire time.  She has to be at work and it only 10 mins away from the hospital he would like to know can he call her when he is ready to go it.

## 2018-10-26 NOTE — H&P (View-Only) (Signed)
HPI [name] returns today for followup of his atrial flutter. He underwent a fairly difficult flutter ablation just over a month ago. He developed recurrent increased heart rate (he has an apple watch) and was found to be back in atrial flutter. He has minimal palpitations.  Allergies  Allergen Reactions  . Morphine And Related Nausea And Vomiting  . Crestor [Rosuvastatin]     Myalgias   . Vytorin [Ezetimibe-Simvastatin]     Myalgias      Current Outpatient Medications  Medication Sig Dispense Refill  . aspirin 81 MG tablet Take 81 mg by mouth daily.    . carvedilol (COREG) 12.5 MG tablet Take 12.5 mg by mouth 2 (two) times daily with a meal.    . cyclobenzaprine (FLEXERIL) 10 MG tablet Take 10 mg by mouth 3 (three) times daily as needed for muscle spasms.    Marland Kitchen ELIQUIS 5 MG TABS tablet TAKE 1 TABLET BY MOUTH TWICE A DAY 60 tablet 3  . fenofibrate 54 MG tablet Take 54 mg by mouth daily.    Marland Kitchen losartan (COZAAR) 100 MG tablet Take 100 mg by mouth daily.    Marland Kitchen PROAIR HFA 108 (90 BASE) MCG/ACT inhaler Inhale 1-2 puffs into the lungs every 6 (six) hours as needed for wheezing or shortness of breath.     Marland Kitchen rOPINIRole (REQUIP) 1 MG tablet Take 1 mg by mouth 6 (six) times daily.     . traZODone (DESYREL) 50 MG tablet Take 1 tablet by mouth at bedtime as needed for sleep.   0   No current facility-administered medications for this visit.      Past Medical History:  Diagnosis Date  . Coronary artery disease   . Dyslipidemia   . H/O cardiovascular stress test 10/19/2012   MET test - good effort, peak VO2 of almost 112% predicted, HR 79% predicted, mildly ischemia at end of exercise  . History of hyperthyroidism as a child   . History of nuclear stress test 03/2011   bruce myoview; mild perfusion defect in mid anteroseptal, apical septal, apical regions (infarct/scar/breast attenuation); post-stress EF 55%; no signficant ischemia  . Ischemic cardiomyopathy    history of   .  Myocardial infarction ([place])   . PONV (postoperative nausea and vomiting)   . Primary localized osteoarthritis of right knee   . S/P CABG (coronary artery bypass graft) 2000  . 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 Wears glasses     ROS:   All systems reviewed and negative except as noted in the HPI.   Past Surgical History:  Procedure Laterality Date  . A-FLUTTER ABLATION N/A 10/01/2018   Procedure: A-FLUTTER ABLATION;  Surgeon: Evans Lance, MD;  Location: [facility];  Service: Cardiovascular;  Laterality: N/A;  . CARDIAC CATHETERIZATION  06/01/2007   no obstructive CAD, patent grafts, EF 40%, apical hypokinesis, anterolateral hypokinesis (Dr. Gerrie Nordmann)   . CERVICAL SPINE SURGERY    . CHOLECYSTECTOMY    . COLONOSCOPY    . CORONARY ARTERY BYPASS GRAFT  06/05/1999   LIMA to LAD, SVG to diagonal 1, SVG to PDA of Cfx, right radial graft to OM1 (Dr. Remus Loffler)   . ESOPHAGOGASTRODUODENOSCOPY    . EYE SURGERY    . KNEE SURGERY Right    pt. states x 6   . TOTAL KNEE ARTHROPLASTY Right 07/23/2015   Procedure: RIGHT TOTAL KNEE ARTHROPLASTY;  Surgeon: Elsie Saas, MD;  Location: [facility];  Service: Orthopedics;  Laterality: Right;  . TRANSTHORACIC ECHOCARDIOGRAM  02/2008   EF 40%, severe apical wall hypokinesis, mod-severe anterior wall hypokinesis; RV mildly dilated; mild mitral annular calcif; mild TR, RSVP 30-19mHg     Family History  Problem Relation Age of Onset  . CAD Father        s/p CABGx4 at 743 . Hyperlipidemia Father      Social History   Socioeconomic History  . Marital status: Married    Spouse name: Not on file  . Number of children: 2  . Years of education: 177 . Highest education level: Not on file  Occupational History    Employer: [employer]  . Financial resource strain: Not on file  . Food insecurity:    Worry: Not on file    Inability: Not on file  . Transportation needs:    Medical: Not on file     Non-medical: Not on file  Tobacco Use  . Smoking status: Former Smoker    Types: Cigars  . Smokeless tobacco: Never Used  . Tobacco comment: cigar occassionally  Substance and Sexual Activity  . Alcohol use: Yes    Alcohol/week: 0.0 standard drinks    Comment: socially  . Drug use: No  . Sexual activity: Not on file  Lifestyle  . Physical activity:    Days per week: Not on file    Minutes per session: Not on file  . Stress: Not on file  Relationships  . Social connections:    Talks on phone: Not on file    Gets together: Not on file    Attends religious service: Not on file    Active member of club or organization: Not on file    Attends meetings of clubs or organizations: Not on file    Relationship status: Not on file  . Intimate partner violence:    Fear of current or ex partner: Not on file    Emotionally abused: Not on file    Physically abused: Not on file    Forced sexual activity: Not on file  Other Topics Concern  . Not on file  Social History Narrative  . Not on file     BP 110/74   Pulse (!) 126   Ht '5\' 8"'  (1.727 m)   Wt 227 lb 6.4 oz (103.1 kg)   SpO2 98%   BMI 34.58 kg/m   Physical Exam:  Well appearing NAD HEENT: Unremarkable Neck:  No JVD, no thyromegally Lymphatics:  No adenopathy Back:  No CVA tenderness Lungs:  Clear with no wheezes HEART:  Regular tachy rhythm, no murmurs, no rubs, no clicks Abd:  soft, positive bowel sounds, no organomegally, no rebound, no guarding Ext:  2 plus pulses, no edema, no cyanosis, no clubbing Skin:  No rashes no nodules Neuro:  CN II through XII intact, motor grossly intact  EKG - atrial flutter with 2:1 AV conduction    Assess/Plan: 1. Recurrent atrial flutter - he has had recurrent conduction. Review of his procedure note suggests that he was difficult to sedate with 14 of versed and almost 200 of fentanyl. I will schedule repeat ablation with anesthesia and irrigated RF. 2. Coags - he will continue  his systemic anti-coagulation. He is instructed not to miss his Eliquis.  GMikle BosworthD.

## 2018-10-26 NOTE — Patient Instructions (Addendum)
Medication Instructions:  Your physician recommends that you continue on your current medications as directed. Please refer to the Current Medication list given to you today.  Labwork: You will get lab work today:  BMP and CBC.  Testing/Procedures: Your physician has recommended that you have an ablation. Catheter ablation is a medical procedure used to treat some cardiac arrhythmias (irregular heartbeats). During catheter ablation, a long, thin, flexible tube is put into a blood vessel in your groin (upper thigh), or neck. This tube is called an ablation catheter. It is then guided to your heart through the blood vessel. Radio frequency waves destroy small areas of heart tissue where abnormal heartbeats may cause an arrhythmia to start. Please see the instruction sheet given to you today.  Follow-Up:  You will follow up with Dr. Ladona Ridgelaylor 4 weeks after your procedure.  Any Other Special Instructions Will Be Listed Below (If Applicable).  Please [facility] at:  9:30 am on November 02, 2018 Do not eat or drink after midnight prior to procedure Do not take any medications the morning of the procedure Plan for one night stay You will need someone to drive you home at discharge  If you need a refill on your cardiac medications before your next appointment, please call your pharmacy.

## 2018-10-29 ENCOUNTER — Ambulatory Visit: Payer: PPO | Admitting: Internal Medicine

## 2018-11-02 ENCOUNTER — Ambulatory Visit (HOSPITAL_COMMUNITY): Payer: PPO | Admitting: Registered Nurse

## 2018-11-02 ENCOUNTER — Encounter (HOSPITAL_COMMUNITY): Payer: Self-pay | Admitting: Certified Registered Nurse Anesthetist

## 2018-11-02 ENCOUNTER — Encounter (HOSPITAL_COMMUNITY)
Admission: RE | Disposition: A | Discharge status: Home / Self Care | Payer: Self-pay | Source: Ambulatory Visit | Attending: Internal Medicine

## 2018-11-02 ENCOUNTER — Ambulatory Visit (HOSPITAL_COMMUNITY)
Admission: RE | Admit: 2018-11-02 | Discharge: 2018-11-03 | Disposition: A | Discharge status: Home / Self Care | Payer: PPO | Source: Ambulatory Visit | Attending: Internal Medicine | Admitting: Internal Medicine

## 2018-11-02 ENCOUNTER — Other Ambulatory Visit: Payer: Self-pay

## 2018-11-02 DIAGNOSIS — I4892 Unspecified atrial flutter: Secondary | ICD-10-CM | POA: Diagnosis not present

## 2018-11-02 DIAGNOSIS — Z7982 Long term (current) use of aspirin: Secondary | ICD-10-CM | POA: Diagnosis not present

## 2018-11-02 DIAGNOSIS — Z9889 Other specified postprocedural states: Secondary | ICD-10-CM | POA: Insufficient documentation

## 2018-11-02 DIAGNOSIS — Z8249 Family history of ischemic heart disease and other diseases of the circulatory system: Secondary | ICD-10-CM | POA: Diagnosis not present

## 2018-11-02 DIAGNOSIS — I2510 Atherosclerotic heart disease of native coronary artery without angina pectoris: Secondary | ICD-10-CM | POA: Diagnosis not present

## 2018-11-02 DIAGNOSIS — Z87891 Personal history of nicotine dependence: Secondary | ICD-10-CM | POA: Insufficient documentation

## 2018-11-02 DIAGNOSIS — Z7901 Long term (current) use of anticoagulants: Secondary | ICD-10-CM | POA: Diagnosis not present

## 2018-11-02 DIAGNOSIS — Z885 Allergy status to narcotic agent status: Secondary | ICD-10-CM | POA: Diagnosis not present

## 2018-11-02 DIAGNOSIS — E785 Hyperlipidemia, unspecified: Secondary | ICD-10-CM | POA: Insufficient documentation

## 2018-11-02 DIAGNOSIS — I255 Ischemic cardiomyopathy: Secondary | ICD-10-CM | POA: Diagnosis not present

## 2018-11-02 DIAGNOSIS — I483 Typical atrial flutter: Secondary | ICD-10-CM | POA: Diagnosis present

## 2018-11-02 DIAGNOSIS — Z9049 Acquired absence of other specified parts of digestive tract: Secondary | ICD-10-CM | POA: Insufficient documentation

## 2018-11-02 DIAGNOSIS — I252 Old myocardial infarction: Secondary | ICD-10-CM | POA: Diagnosis not present

## 2018-11-02 DIAGNOSIS — Z951 Presence of aortocoronary bypass graft: Secondary | ICD-10-CM | POA: Diagnosis not present

## 2018-11-02 DIAGNOSIS — Z79899 Other long term (current) drug therapy: Secondary | ICD-10-CM | POA: Insufficient documentation

## 2018-11-02 DIAGNOSIS — Z96651 Presence of right artificial knee joint: Secondary | ICD-10-CM | POA: Insufficient documentation

## 2018-11-02 HISTORY — PX: A-FLUTTER ABLATION: EP1230

## 2018-11-02 SURGERY — A-FLUTTER ABLATION
Anesthesia: General

## 2018-11-02 MED ORDER — SODIUM CHLORIDE 0.9% FLUSH: 3.0000 mL | INTRAVENOUS | Status: DC | PRN

## 2018-11-02 MED ORDER — CYCLOBENZAPRINE HCL 10 MG PO TABS
10.0000 mg | ORAL_TABLET | Freq: Three times a day (TID) | ORAL | Status: DC | PRN
  Administered 2018-11-02: 10 mg via ORAL
  Filled 2018-11-02: qty 1

## 2018-11-02 MED ORDER — APIXABAN 5 MG PO TABS
5.0000 mg | ORAL_TABLET | Freq: Two times a day (BID) | ORAL | Status: DC
  Administered 2018-11-02 – 2018-11-03 (×2): 5 mg via ORAL
  Filled 2018-11-02 (×2): qty 1

## 2018-11-02 MED ORDER — DEXAMETHASONE SODIUM PHOSPHATE 10 MG/ML IJ SOLN
INTRAMUSCULAR | Status: DC | PRN
  Administered 2018-11-02: 10 mg via INTRAVENOUS

## 2018-11-02 MED ORDER — HEPARIN SODIUM (PORCINE) 1000 UNIT/ML IJ SOLN
INTRAMUSCULAR | Status: DC | PRN
  Administered 2018-11-02: 1000 [IU] via INTRAVENOUS

## 2018-11-02 MED ORDER — ONDANSETRON HCL 4 MG/2ML IJ SOLN
INTRAMUSCULAR | Status: DC | PRN
  Administered 2018-11-02: 4 mg via INTRAVENOUS

## 2018-11-02 MED ORDER — MIDAZOLAM HCL 2 MG/2ML IJ SOLN
INTRAMUSCULAR | Status: DC | PRN
  Administered 2018-11-02: 2 mg via INTRAVENOUS

## 2018-11-02 MED ORDER — ACETAMINOPHEN 325 MG PO TABS: 650.0000 mg | ORAL_TABLET | ORAL | Status: DC | PRN

## 2018-11-02 MED ORDER — FENTANYL CITRATE (PF) 100 MCG/2ML IJ SOLN
INTRAMUSCULAR | Status: AC
  Filled 2018-11-02: qty 2

## 2018-11-02 MED ORDER — PROPOFOL 10 MG/ML IV BOLUS
INTRAVENOUS | Status: DC | PRN
  Administered 2018-11-02: 40 mg via INTRAVENOUS
  Administered 2018-11-02: 160 mg via INTRAVENOUS

## 2018-11-02 MED ORDER — SODIUM CHLORIDE 0.9 % IV SOLN: 250.0000 mL | INTRAVENOUS | Status: DC | PRN

## 2018-11-02 MED ORDER — FENOFIBRATE 54 MG PO TABS
54.0000 mg | ORAL_TABLET | Freq: Every day | ORAL | Status: DC
  Administered 2018-11-02: 54 mg via ORAL
  Filled 2018-11-02: qty 1

## 2018-11-02 MED ORDER — HEPARIN (PORCINE) IN NACL 1000-0.9 UT/500ML-% IV SOLN
INTRAVENOUS | Status: DC | PRN
  Administered 2018-11-02: 500 mL

## 2018-11-02 MED ORDER — SODIUM CHLORIDE 0.9 % IV SOLN
INTRAVENOUS | Status: DC | PRN
  Administered 2018-11-02: 15 ug/min via INTRAVENOUS

## 2018-11-02 MED ORDER — SODIUM CHLORIDE 0.9 % IV SOLN
INTRAVENOUS | Status: DC
  Administered 2018-11-02 (×2): via INTRAVENOUS

## 2018-11-02 MED ORDER — FENTANYL CITRATE (PF) 100 MCG/2ML IJ SOLN
25.0000 ug | INTRAMUSCULAR | Status: DC | PRN
  Administered 2018-11-02 (×2): 50 ug via INTRAVENOUS

## 2018-11-02 MED ORDER — LOSARTAN POTASSIUM 50 MG PO TABS
100.0000 mg | ORAL_TABLET | Freq: Every day | ORAL | Status: DC
  Administered 2018-11-02 – 2018-11-03 (×2): 100 mg via ORAL
  Filled 2018-11-02 (×2): qty 2

## 2018-11-02 MED ORDER — HEPARIN (PORCINE) IN NACL 1000-0.9 UT/500ML-% IV SOLN
INTRAVENOUS | Status: AC
  Filled 2018-11-02: qty 500

## 2018-11-02 MED ORDER — PHENYLEPHRINE 40 MCG/ML (10ML) SYRINGE FOR IV PUSH (FOR BLOOD PRESSURE SUPPORT)
PREFILLED_SYRINGE | INTRAVENOUS | Status: DC | PRN
  Administered 2018-11-02: 80 ug via INTRAVENOUS

## 2018-11-02 MED ORDER — ALBUTEROL SULFATE (2.5 MG/3ML) 0.083% IN NEBU: 3.0000 mL | INHALATION_SOLUTION | Freq: Four times a day (QID) | RESPIRATORY_TRACT | Status: DC | PRN

## 2018-11-02 MED ORDER — CARVEDILOL 12.5 MG PO TABS
12.5000 mg | ORAL_TABLET | Freq: Two times a day (BID) | ORAL | Status: DC
  Administered 2018-11-02 – 2018-11-03 (×2): 12.5 mg via ORAL
  Filled 2018-11-02 (×2): qty 1

## 2018-11-02 MED ORDER — ONDANSETRON HCL 4 MG/2ML IJ SOLN: 4.0000 mg | Freq: Four times a day (QID) | INTRAMUSCULAR | Status: DC | PRN

## 2018-11-02 MED ORDER — ASPIRIN EC 81 MG PO TBEC
81.0000 mg | DELAYED_RELEASE_TABLET | Freq: Every day | ORAL | Status: DC
  Administered 2018-11-02 – 2018-11-03 (×2): 81 mg via ORAL
  Filled 2018-11-02 (×2): qty 1

## 2018-11-02 MED ORDER — BUPIVACAINE HCL (PF) 0.25 % IJ SOLN
INTRAMUSCULAR | Status: AC
  Filled 2018-11-02: qty 30

## 2018-11-02 MED ORDER — FENTANYL CITRATE (PF) 100 MCG/2ML IJ SOLN
INTRAMUSCULAR | Status: DC | PRN
  Administered 2018-11-02: 75 ug via INTRAVENOUS
  Administered 2018-11-02 (×4): 25 ug via INTRAVENOUS
  Administered 2018-11-02: 75 ug via INTRAVENOUS
  Administered 2018-11-02 (×2): 25 ug via INTRAVENOUS

## 2018-11-02 MED ORDER — ROPINIROLE HCL 1 MG PO TABS
1.0000 mg | ORAL_TABLET | Freq: Every day | ORAL | Status: DC
  Administered 2018-11-02 – 2018-11-03 (×4): 1 mg via ORAL
  Filled 2018-11-02: qty 2
  Filled 2018-11-02 (×3): qty 1

## 2018-11-02 MED ORDER — SODIUM CHLORIDE 0.9% FLUSH
3.0000 mL | Freq: Two times a day (BID) | INTRAVENOUS | Status: DC
  Administered 2018-11-02 – 2018-11-03 (×2): 3 mL via INTRAVENOUS

## 2018-11-02 MED ORDER — TRAZODONE HCL 50 MG PO TABS
50.0000 mg | ORAL_TABLET | Freq: Every evening | ORAL | Status: DC | PRN
  Administered 2018-11-02: 50 mg via ORAL
  Filled 2018-11-02: qty 1

## 2018-11-02 MED ORDER — BUPIVACAINE HCL (PF) 0.25 % IJ SOLN
INTRAMUSCULAR | Status: DC | PRN
  Administered 2018-11-02: 30 mL

## 2018-11-02 SURGICAL SUPPLY — 10 items
BLANKET WARM UNDERBOD FULL ACC (MISCELLANEOUS) ×2 IMPLANT
CATH DUODECA HALO/ISMUS 7FR (CATHETERS) ×2 IMPLANT
CATH SMTCH THERMOCOOL SF FJ (CATHETERS) ×2 IMPLANT
PACK EP LATEX FREE (CUSTOM PROCEDURE TRAY) ×3
PACK EP LF (CUSTOM PROCEDURE TRAY) ×1 IMPLANT
PAD PRO RADIOLUCENT 2001M-C (PAD) ×3 IMPLANT
SHEATH PINNACLE 6F 10CM (SHEATH) ×2 IMPLANT
SHEATH PINNACLE 7F 10CM (SHEATH) ×2 IMPLANT
SHEATH PINNACLE 8F 10CM (SHEATH) ×4 IMPLANT
TUBING SMART ABLATE COOLFLOW (TUBING) ×2 IMPLANT

## 2018-11-02 NOTE — Anesthesia Postprocedure Evaluation (Signed)
Anesthesia Post Note  Patient: Lee Mclaughlin  Procedure(s) Performed: A-FLUTTER ABLATION (N/A )     Patient location during evaluation: Cath Lab Anesthesia Type: General Level of consciousness: awake and alert Pain management: pain level controlled Vital Signs Assessment: post-procedure vital signs reviewed and stable Respiratory status: spontaneous breathing, nonlabored ventilation, respiratory function stable and patient connected to nasal cannula oxygen Cardiovascular status: blood pressure returned to baseline and stable Postop Assessment: no apparent nausea or vomiting Anesthetic complications: no    Last Vitals:  Vitals:   11/02/18 1625 11/02/18 1637  BP: (!) 169/94   Pulse: 77   Resp: (!) 0   Temp:  36.6 C  SpO2: 98%     Last Pain:  Vitals:   11/02/18 1637  TempSrc: Temporal  PainSc:                  [name] COKER

## 2018-11-02 NOTE — Progress Notes (Signed)
Site area: rt groin fv sheaths x2 Site Prior to Removal:  Level 0 Pressure Applied For:  20 minutes Manual:   yes Patient Status During Pull:  stable Post Pull Site:  Level  0 Post Pull Instructions Given:  yes Post Pull Pulses Present:  Rt pt palpable Dressing Applied:  Gauze and gauze and tegaderm Bedrest begins @ 1640 Comments:  IV saline locked

## 2018-11-02 NOTE — Transfer of Care (Signed)
Immediate Anesthesia Transfer of Care Note  Patient: Lee Mclaughlin  Procedure(s) Performed: A-FLUTTER ABLATION (N/A )  Patient Location: Cath Lab  Anesthesia Type:General  Level of Consciousness: awake, alert  and oriented  Airway & Oxygen Therapy: Patient Spontanous Breathing and Patient connected to nasal cannula oxygen  Post-op Assessment: Report given to RN, Post -op Vital signs reviewed and stable and Patient moving all extremities  Post vital signs: Reviewed and stable  Last Vitals:  Vitals Value Taken Time  BP 198/125 11/02/2018  4:13 PM  Temp 36.4 C 11/02/2018  4:06 PM  Pulse 83 11/02/2018  4:15 PM  Resp 22 11/02/2018  4:15 PM  SpO2 99 % 11/02/2018  4:15 PM  Vitals shown include unvalidated device data.  Last Pain:  Vitals:   11/02/18 1606  TempSrc: Temporal  PainSc: 0-No pain      Patients Stated Pain Goal: 3 (11/02/18 0949)  Complications: No apparent anesthesia complications

## 2018-11-02 NOTE — Interval H&P Note (Signed)
History and Physical Interval Note:  11/02/2018 1:16 PM  Lee Mclaughlin  has presented today for surgery, with the diagnosis of aflutter  The various methods of treatment have been discussed with the patient and family. After consideration of risks, benefits and other options for treatment, the patient has consented to  Procedure(s): A-FLUTTER ABLATION (N/A) as a surgical intervention .  The patient's history has been reviewed, patient examined, no change in status, stable for surgery.  I have reviewed the patient's chart and labs.  Questions were answered to the patient's satisfaction.     Lewayne BuntingGregg Cieanna Stormes

## 2018-11-02 NOTE — Progress Notes (Signed)
Calming down. Appropriate.

## 2018-11-02 NOTE — Progress Notes (Signed)
Arrived to holding area crying.Trying to bend rt leg and sit up. Frequently needing verbal instruction to lay still. Reassurance given.

## 2018-11-02 NOTE — Plan of Care (Signed)
  Problem: Education: Goal: Knowledge of General Education information will improve Description Including pain rating scale, medication(s)/side effects and non-pharmacologic comfort measures Outcome: Completed/Met   Problem: Clinical Measurements: Goal: Respiratory complications will improve Outcome: Completed/Met Goal: Cardiovascular complication will be avoided Outcome: Completed/Met   Problem: Nutrition: Goal: Adequate nutrition will be maintained Outcome: Completed/Met

## 2018-11-02 NOTE — Anesthesia Procedure Notes (Signed)
Procedure Name: LMA Insertion Date/Time: 11/02/2018 1:25 PM Performed by: Laruth BouchardGolob, Rigel Filsinger C., CRNA Pre-anesthesia Checklist: Patient identified, Emergency Drugs available, Suction available, Patient being monitored and Timeout performed Patient Re-evaluated:Patient Re-evaluated prior to induction Oxygen Delivery Method: Circle system utilized Preoxygenation: Pre-oxygenation with 100% oxygen Induction Type: IV induction Ventilation: Mask ventilation without difficulty LMA: LMA inserted LMA Size: 5.0 Number of attempts: 1 Placement Confirmation: positive ETCO2 and breath sounds checked- equal and bilateral Tube secured with: Tape Dental Injury: Teeth and Oropharynx as per pre-operative assessment

## 2018-11-02 NOTE — Discharge Summary (Addendum)
ELECTROPHYSIOLOGY PROCEDURE DISCHARGE SUMMARY    Patient ID: Lee Mclaughlin,  MRN: [id number], DOB/AGE: [birth year] 67 y.o.  Admit date: 11/02/2018 Discharge date: 11/02/18  Primary Care Physician: Lee BannerWilson, Fred H, MD  Primary Cardiologist: Dr. Rennis Mclaughlin Electrophysiologist: Dr. Ladona Mclaughlin  Primary Discharge Diagnosis:  1. Paroxysmal AFlutter     CHA2DS2Vasc is 2, on Eliquis, appropriately dosed  Secondary Discharge Diagnosis:  1. CAD 2. HLD 3. CM w/recovered LVEF   Allergies  Allergen Reactions  . Morphine And Related Nausea And Vomiting  . Crestor [Rosuvastatin]     Myalgias   . Vytorin [Ezetimibe-Simvastatin]     Myalgias      Procedures This Admission: 1.  Electrophysiology study and radiofrequency catheter ablation on 11/02/18 by Dr Lee Mclaughlin.  This study demonstrated  Conclusion: Successful EP study and catheter ablation of typical atrial flutter isthmus in a patient with recurrent atrial flutter following initial catheter ablation of atrial flutter.  Three-dimensional irrigated electro-anatomic mapping was carried out for the ablation procedure  Brief HPI: Lee Mclaughlin is a 67 y.o. male with a past medical history as outlined above.  He has had recovery of his AFlutter conduction s/p ablation 10/01/18.  He has been appropriately anticoagulated.  Risks, benefits, and alternatives to ablation were reviewed with the patient who wished to proceed.   Hospital Course:  The patient was admitted and underwent EPS/RFCA with details as outlined above. {He was monitored on telemetry overnight which demonstrated SR.  R groin is without complication.  The patient feels well, no CP, SOB or site discomfort.  He was examined by Dr Lee Mclaughlin who considered them stable for discharge to home.  Follow up will be arranged in 4 weeks.  Wound care and restrictions were reviewed with the patient prior to discharge.   Physical Exam: Vitals:   11/02/18 2100 11/03/18 0650 11/03/18 0650 11/03/18 0849   BP: 130/79  129/78 130/79  Pulse:   (!) 103   Resp:      Temp:   97.8 F (36.6 C)   TempSrc:   Oral   SpO2:   97%   Weight:  104.6 kg    Height:        GEN- The patient is well appearing, alert and oriented x 3 today.   HEENT: normocephalic, atraumatic; sclera clear, conjunctiva pink; hearing intact; oropharynx clear; neck supple, no JVP Lymph- no cervical lymphadenopathy Lungs- CTA b/l, normal work of breathing.  No wheezes, rales, rhonchi Heart- RRR, no murmurs, rubs or gallops, PMI not laterally displaced GI- soft, non-tender, non-distended Extremities- no clubbing, cyanosis, or edema; DP/PT  2+ bilaterally, R groin without hematoma/bruit MS- no significant deformity or atrophy Skin- warm and dry, no rash or lesion Psych- euthymic mood, full affect Neuro- strength and sensation are intact   Labs:   Lab Results  Component Value Date   WBC 7.3 10/26/2018   HGB 17.5 10/26/2018   HCT 51.1 (Mclaughlin) 10/26/2018   MCV 86 10/26/2018   PLT 190 10/26/2018   No results for input(s): NA, K, CL, CO2, BUN, CREATININE, CALCIUM, PROT, BILITOT, ALKPHOS, ALT, AST, GLUCOSE in the last 168 hours.  Invalid input(s): LABALBU  Discharge Medications:  Allergies as of 11/03/2018      Reactions   Morphine And Related Nausea And Vomiting   Crestor [rosuvastatin]    Myalgias   Vytorin [ezetimibe-simvastatin]    Myalgias      Medication List    TAKE these medications   aspirin 81 MG tablet  Take 81 mg by mouth daily.   B-12 PO Take 1 tablet by mouth daily.   carvedilol 12.5 MG tablet Commonly known as:  COREG Take 12.5 mg by mouth 2 (two) times daily with a meal.   cyclobenzaprine 10 MG tablet Commonly known as:  FLEXERIL Take 10 mg by mouth 3 (three) times daily as needed for muscle spasms.   ELIQUIS 5 MG Tabs tablet Generic drug:  apixaban TAKE 1 TABLET BY MOUTH TWICE A DAY   fenofibrate 54 MG tablet Take 54 mg by mouth daily.   losartan 100 MG tablet Commonly known as:   COZAAR Take 100 mg by mouth daily.   PROAIR HFA 108 (90 Base) MCG/ACT inhaler Generic drug:  albuterol Inhale 1-2 puffs into the lungs every 6 (six) hours as needed for wheezing or shortness of breath.   rOPINIRole 1 MG tablet Commonly known as:  REQUIP Take 1 mg by mouth 6 (six) times daily.   traZODone 50 MG tablet Commonly known as:  DESYREL Take 50 mg by mouth at bedtime as needed for sleep.       Disposition: Home  Discharge Instructions    Diet - low sodium heart healthy   Complete by:  As directed    Increase activity slowly   Complete by:  As directed      Follow-up Information    Lee Maw, MD Follow up.   Specialty:  Cardiology Why:  12/15/18 @ 8:30AM Contact information: [address] [place] [address] [phone]           Duration of Discharge Encounter: Greater than 30 minutes including physician time.  Lee Fredrickson, PA-C 11/03/2018 11:29 AM   EP attending  Patient seen and examined.  Agree with the findings as noted above.  He is maintaining sinus rhythm after catheter ablation of atrial flutter.  He denies chest pain.  His groin looks good.  He will be discharged home with usual follow-up.  Lee Bunting, MD

## 2018-11-02 NOTE — Discharge Instructions (Signed)
Post procedure care instructions No driving for 4 days. No lifting over 5 lbs for 1 week. No vigorous or sexual activity for 1 week. You may return to work on 11/09/18. Keep procedure site clean & dry. If you notice increased pain, swelling, bleeding or pus, call/return!  You may shower, but no soaking baths/hot tubs/pools for 1 week.

## 2018-11-02 NOTE — Anesthesia Preprocedure Evaluation (Signed)
Anesthesia Evaluation  Patient identified by MRN, date of birth, ID band Patient awake    Reviewed: Allergy & Precautions, NPO status , Patient's Chart, lab work & pertinent test results  Airway Mallampati: II  TM Distance: >3 FB Neck ROM: Full    Dental  (+) Teeth Intact   Pulmonary former smoker,    breath sounds clear to auscultation       Cardiovascular  Rhythm:Regular Rate:Normal     Neuro/Psych    GI/Hepatic   Endo/Other    Renal/GU      Musculoskeletal   Abdominal   Peds  Hematology   Anesthesia Other Findings   Reproductive/Obstetrics                             Anesthesia Physical Anesthesia Plan  ASA: III  Anesthesia Plan: General   Post-op Pain Management:    Induction: Intravenous  PONV Risk Score and Plan: 1 and Ondansetron and Dexamethasone  Airway Management Planned: Oral ETT  Additional Equipment:   Intra-op Plan:   Post-operative Plan: Extubation in OR  Informed Consent: I have reviewed the patients History and Physical, chart, labs and discussed the procedure including the risks, benefits and alternatives for the proposed anesthesia with the patient or authorized representative who has indicated his/her understanding and acceptance.   Dental advisory given  Plan Discussed with: CRNA and Anesthesiologist  Anesthesia Plan Comments:         Anesthesia Quick Evaluation

## 2018-11-03 ENCOUNTER — Encounter (HOSPITAL_COMMUNITY): Payer: Self-pay | Admitting: Internal Medicine

## 2018-11-03 DIAGNOSIS — Z7982 Long term (current) use of aspirin: Secondary | ICD-10-CM | POA: Diagnosis not present

## 2018-11-03 DIAGNOSIS — I4892 Unspecified atrial flutter: Secondary | ICD-10-CM | POA: Diagnosis not present

## 2018-11-03 DIAGNOSIS — Z79899 Other long term (current) drug therapy: Secondary | ICD-10-CM | POA: Diagnosis not present

## 2018-11-03 DIAGNOSIS — E785 Hyperlipidemia, unspecified: Secondary | ICD-10-CM | POA: Diagnosis not present

## 2018-11-03 DIAGNOSIS — I255 Ischemic cardiomyopathy: Secondary | ICD-10-CM | POA: Diagnosis not present

## 2018-11-03 DIAGNOSIS — Z9049 Acquired absence of other specified parts of digestive tract: Secondary | ICD-10-CM | POA: Diagnosis not present

## 2018-11-03 DIAGNOSIS — I483 Typical atrial flutter: Secondary | ICD-10-CM

## 2018-11-03 DIAGNOSIS — I252 Old myocardial infarction: Secondary | ICD-10-CM | POA: Diagnosis not present

## 2018-11-03 DIAGNOSIS — I2510 Atherosclerotic heart disease of native coronary artery without angina pectoris: Secondary | ICD-10-CM | POA: Diagnosis not present

## 2018-11-03 DIAGNOSIS — Z87891 Personal history of nicotine dependence: Secondary | ICD-10-CM | POA: Diagnosis not present

## 2018-11-03 DIAGNOSIS — Z885 Allergy status to narcotic agent status: Secondary | ICD-10-CM | POA: Diagnosis not present

## 2018-11-03 DIAGNOSIS — Z7901 Long term (current) use of anticoagulants: Secondary | ICD-10-CM | POA: Diagnosis not present

## 2018-11-03 DIAGNOSIS — Z951 Presence of aortocoronary bypass graft: Secondary | ICD-10-CM | POA: Diagnosis not present

## 2018-12-15 ENCOUNTER — Encounter (INDEPENDENT_AMBULATORY_CARE_PROVIDER_SITE_OTHER): Payer: Self-pay

## 2018-12-15 ENCOUNTER — Ambulatory Visit: Payer: PPO | Admitting: Internal Medicine

## 2018-12-15 ENCOUNTER — Encounter: Payer: Self-pay | Admitting: Internal Medicine

## 2018-12-15 VITALS — BP 124/64 | HR 66 | Ht 68.0 in | Wt 238.0 lb

## 2018-12-15 DIAGNOSIS — I483 Typical atrial flutter: Secondary | ICD-10-CM

## 2018-12-15 DIAGNOSIS — I255 Ischemic cardiomyopathy: Secondary | ICD-10-CM

## 2018-12-15 DIAGNOSIS — Z951 Presence of aortocoronary bypass graft: Secondary | ICD-10-CM

## 2018-12-15 NOTE — Progress Notes (Signed)
HPI Lee Mclaughlin returns today for followup. He is a pleasant 68 yo man with a h/o atrial flutter, s/p ablation twice. He has done well in the interim. He is pending surgery. He denies palpitations, sob, or chest pain.  Allergies  Allergen Reactions  . Morphine And Related Nausea And Vomiting  . Crestor [Rosuvastatin]     Myalgias   . Vytorin [Ezetimibe-Simvastatin]     Myalgias      Current Outpatient Medications  Medication Sig Dispense Refill  . aspirin 81 MG tablet Take 81 mg by mouth daily.    . carvedilol (COREG) 12.5 MG tablet Take 12.5 mg by mouth 2 (two) times daily with a meal.    . Cyanocobalamin (B-12 PO) Take 1 tablet by mouth daily.    . cyclobenzaprine (FLEXERIL) 10 MG tablet Take 10 mg by mouth 3 (three) times daily as needed for muscle spasms.    Marland Kitchen ELIQUIS 5 MG TABS tablet TAKE 1 TABLET BY MOUTH TWICE A DAY 60 tablet 3  . fenofibrate 54 MG tablet Take 54 mg by mouth daily.    Marland Kitchen losartan (COZAAR) 100 MG tablet Take 100 mg by mouth daily.    Marland Kitchen PROAIR HFA 108 (90 BASE) MCG/ACT inhaler Inhale 1-2 puffs into the lungs every 6 (six) hours as needed for wheezing or shortness of breath.     Marland Kitchen rOPINIRole (REQUIP) 1 MG tablet Take 1 mg by mouth 6 (six) times daily.     . traZODone (DESYREL) 50 MG tablet Take 50 mg by mouth at bedtime as needed for sleep.   0   No current facility-administered medications for this visit.      Past Medical History:  Diagnosis Date  . Coronary artery disease   . Dyslipidemia   . H/O cardiovascular stress test 10/19/2012   MET test - good effort, peak VO2 of almost 112% predicted, HR 79% predicted, mildly ischemia at end of exercise  . History of hyperthyroidism as a child   . History of nuclear stress test 03/2011   bruce myoview; mild perfusion defect in mid anteroseptal, apical septal, apical regions (infarct/scar/breast attenuation); post-stress EF 55%; no signficant ischemia  . Ischemic cardiomyopathy    history of   .  Myocardial infarction ([place])   . PONV (postoperative nausea and vomiting)   . Primary localized osteoarthritis of right knee   . S/P CABG (coronary artery bypass graft) 2000  . 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 Wears glasses     ROS:   All systems reviewed and negative except as noted in the HPI.   Past Surgical History:  Procedure Laterality Date  . A-FLUTTER ABLATION N/A 10/01/2018   Procedure: A-FLUTTER ABLATION;  Surgeon: Evans Lance, MD;  Location: [facility];  Service: Cardiovascular;  Laterality: N/A;  . A-FLUTTER ABLATION N/A 11/02/2018   Procedure: A-FLUTTER ABLATION;  Surgeon: Evans Lance, MD;  Location: [facility];  Service: Cardiovascular;  Laterality: N/A;  . CARDIAC CATHETERIZATION  06/01/2007   no obstructive CAD, patent grafts, EF 40%, apical hypokinesis, anterolateral hypokinesis (Dr. Gerrie Nordmann)   . CERVICAL SPINE SURGERY    . CHOLECYSTECTOMY    . COLONOSCOPY    . CORONARY ARTERY BYPASS GRAFT  06/05/1999   LIMA to LAD, SVG to diagonal 1, SVG to PDA of Cfx, right radial graft to OM1 (Dr. Remus Loffler)   . ESOPHAGOGASTRODUODENOSCOPY    . EYE SURGERY    .  KNEE SURGERY Right    pt. states x 6   . TOTAL KNEE ARTHROPLASTY Right 07/23/2015   Procedure: RIGHT TOTAL KNEE ARTHROPLASTY;  Surgeon: Elsie Saas, MD;  Location: [facility];  Service: Orthopedics;  Laterality: Right;  . TRANSTHORACIC ECHOCARDIOGRAM  02/2008   EF 40%, severe apical wall hypokinesis, mod-severe anterior wall hypokinesis; RV mildly dilated; mild mitral annular calcif; mild TR, RSVP 30-19mHg     Family History  Problem Relation Age of Onset  . CAD Father        s/p CABGx4 at 741 . Hyperlipidemia Father      Social History   Socioeconomic History  . Marital status: Married    Spouse name: Not on file  . Number of children: 2  . Years of education: 166 . Highest education level: Not on file  Occupational History    Employer: [employer]  . Financial resource strain: Not on file  . Food insecurity:    Worry: Not on file    Inability: Not on file  . Transportation needs:    Medical: Not on file    Non-medical: Not on file  Tobacco Use  . Smoking status: Former Smoker    Types: Cigars  . Smokeless tobacco: Never Used  . Tobacco comment: cigar occassionally  Substance and Sexual Activity  . Alcohol use: Yes    Alcohol/week: 0.0 standard drinks    Comment: socially  . Drug use: No  . Sexual activity: Not on file  Lifestyle  . Physical activity:    Days per week: Not on file    Minutes per session: Not on file  . Stress: Not on file  Relationships  . Social connections:    Talks on phone: Not on file    Gets together: Not on file    Attends religious service: Not on file    Active member of club or organization: Not on file    Attends meetings of clubs or organizations: Not on file    Relationship status: Not on file  . Intimate partner violence:    Fear of current or ex partner: Not on file    Emotionally abused: Not on file    Physically abused: Not on file    Forced sexual activity: Not on file  Other Topics Concern  . Not on file  Social History Narrative  . Not on file     BP 124/64   Pulse 66   Ht _0  (1.727 m)   Wt 238 lb (108 kg)   BMI 36.19 kg/m   Physical Exam:  Well appearing NAD HEENT: Unremarkable Neck:  No JVD, no thyromegally Lymphatics:  No adenopathy Back:  No CVA tenderness Lungs:  Clear with no wheezes HEART:  Regular rate rhythm, no murmurs, no rubs, no clicks Abd:  soft, positive bowel sounds, no organomegally, no rebound, no guarding Ext:  2 plus pulses, no edema, no cyanosis, no clubbing Skin:  No rashes no nodules Neuro:  CN II through XII intact, motor grossly intact  EKG - nsr   Assess/Plan: 1. Atrial flutter - he is maintaining NSR after ablation. He can stop his Eliquis. He will follow up as needed. 2. Preoperative eval - the patient is pending eye  and possibly hip surgery. He may proceed with either, low risk.  GMikle BosworthD.

## 2018-12-15 NOTE — Patient Instructions (Addendum)
Medication Instructions:  Your physician recommends that you continue on your current medications as directed. Please refer to the Current Medication list given to you today.  Stop ELIQUIS  Labwork: None ordered.  Testing/Procedures: None ordered.  Follow-Up: Your physician wants you to follow-up in: as needed with Dr. Ladona Ridgel.       Any Other Special Instructions Will Be Listed Below (If Applicable).  If you need a refill on your cardiac medications before your next appointment, please call your pharmacy.

## 2018-12-16 DIAGNOSIS — M1611 Unilateral primary osteoarthritis, right hip: Secondary | ICD-10-CM | POA: Diagnosis not present

## 2018-12-20 DIAGNOSIS — H25013 Cortical age-related cataract, bilateral: Secondary | ICD-10-CM | POA: Diagnosis not present

## 2018-12-20 DIAGNOSIS — H25043 Posterior subcapsular polar age-related cataract, bilateral: Secondary | ICD-10-CM | POA: Diagnosis not present

## 2018-12-20 DIAGNOSIS — H2511 Age-related nuclear cataract, right eye: Secondary | ICD-10-CM | POA: Diagnosis not present

## 2018-12-20 DIAGNOSIS — H2513 Age-related nuclear cataract, bilateral: Secondary | ICD-10-CM | POA: Diagnosis not present

## 2018-12-22 DIAGNOSIS — M1611 Unilateral primary osteoarthritis, right hip: Secondary | ICD-10-CM | POA: Diagnosis not present

## 2018-12-23 ENCOUNTER — Telehealth: Payer: Self-pay | Admitting: *Deleted

## 2018-12-23 NOTE — Telephone Encounter (Signed)
   [facility] Pre-operative Risk Assessment    Request for surgical clearance:  1. What type of surgery is being performed? Right total hip replacement   2. When is this surgery scheduled? TBD   3. What type of clearance is required (medical clearance vs. Pharmacy clearance to hold med vs. Both)? medical  4. Are there any medications that need to be held prior to surgery and how long?none   5. Practice name and name of physician performing surgery? Dr Vonna Kotyk landau   6. What is your office phone number [phone] ext [phone]    7.   What is your office fax number [phone]  8.   Anesthesia type (None, local, MAC, general) ? Not listed   Lee Mclaughlin 12/23/2018, 4:23 PM  _________________________________________________________________   (provider comments below)

## 2018-12-27 ENCOUNTER — Ambulatory Visit (INDEPENDENT_AMBULATORY_CARE_PROVIDER_SITE_OTHER): Payer: PPO | Admitting: Internal Medicine

## 2018-12-27 ENCOUNTER — Encounter: Payer: Self-pay | Admitting: Internal Medicine

## 2018-12-27 VITALS — BP 126/80 | HR 60 | Ht 68.0 in | Wt 244.0 lb

## 2018-12-27 DIAGNOSIS — I483 Typical atrial flutter: Secondary | ICD-10-CM | POA: Diagnosis not present

## 2018-12-27 DIAGNOSIS — I255 Ischemic cardiomyopathy: Secondary | ICD-10-CM

## 2018-12-27 DIAGNOSIS — Z951 Presence of aortocoronary bypass graft: Secondary | ICD-10-CM | POA: Diagnosis not present

## 2018-12-27 NOTE — Progress Notes (Signed)
OFFICE NOTE  Chief Complaint:  Follow-up atrial flutter ablation  Primary Care Physician: [name], MD  HPI:  Lee Mclaughlin  is a 68 year old gentleman with a history of coronary disease, status post CABG x 4 in 2000 (LIMA to LAD, SVG to T1, SVG to PDA (left dominant), free radial graft to OM1. His heart cath in 2008 showed no obstructive coronary artery disease after his nuclear stress test was negative. EF was 40%; however, came back to normal in 2012. He is actually incredibly active at this point. 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 He does high impact activity. Recentl was involved with the [employer] program and had been going out to [facility] to receive treatment, until recently when he had a disagreement with his boss and ultimately lost his job and these treatments. This consisted of anti-aging therapy of vitamins, supplements, testosterone injections, etc. I reviewed laboratory work that was performed from that program, which indicates low hemoglobin A1c of 5.3. His CBC was within normal limits. Testosterone tests were at the high end of normal with low fasting serum insulin. His PSA was low. T3, TSH, and T4 were within normal limits. His lipid profile showed total cholesterol of 153, triglycerides 94, HDL 49, LDL 85, which is a good profile. He does have a history of intolerance to statins and has been maintained on fenofibrate and Zetia, and for not being on a statin, actually that LDL cholesterol is pretty low.   He recently discontinued his statin medication and reports marked improvement in chronic pain. He's also stopped high level activity and exercise and has cut back significantly on testosterone injections. He reports that he wishes now to get back to doing some exercise after he returns from a visit out of the country. He is curious to see what his cholesterol looks like today as it's been about 6 months off of medication.  Finally he is  reporting some tenderness and tingling with occasional swelling and a lump that develops under the left lateral malleolus. There is some associated numbness with this.  I saw Lee Mclaughlin back in the office today. He is reporting some abdominal bloating and recently has had problems with some fatigue. He decrease his exercise considerably. He's also had some weight gain. He had stopped taking testosterone but started back on it every 2 weeks. He needs to get back to his exercise. He denies any chest pain or shortness of breath. He's interested in a recheck of his cholesterol today.  12/20/2015  Major returns to the office today for follow-up. Overall he seems to be doing really well. He underwent right knee replacement surgery and says that he has had marked improvement in that. His gait is improved, his low back pain is improved. In fact he says he put on about 95 miles of walking during a three-week trip to [place] that he recently spent. He denies any chest pain or worsening shortness of breath. He walked up several 100 stairs and the [facility] without shortness of breath or chest pain.  09/24/2018  Lee Mclaughlin is seen today in follow-up.  I last saw him in 2017.  Unfortunately recently he was undergoing cataract surgery was found to be tachycardic.  EKG demonstrated atrial flutter with rapid ventricular response.  He was generally unaware this.  He may have had some palpitations or anxiety at night prior to this.  He is also been noted to be a little more  short of breath and fatigued easier but is able to play golf without any limitations.  He was seen by Roderic Palau in the A. fib clinic.  He was started on Eliquis 5 mg twice daily on October 2.  Rate control was not ideal the time and his metoprolol was increased to 75 mg twice daily.  Heart rate today is 109.  He said just up until yesterday the rate was not well controlled.  He is generally asymptomatic.  He does have an upcoming appointment with Dr.  Caryl Comes to discuss an A. fib ablation next week.  12/27/2018  Lee Mclaughlin is seen today in follow-up. He continues to do well after recent atrial flutter ablation. Followed up with Dr. Lovena Le a couple of weeks ago.  He was maintaining sinus rhythm.  He has been subsequently taken off of Eliquis.  He denies any recurrent palpitations.  He is contemplating hip replacement of the right hip in April possibly.  From a cardiac standpoint he is at acceptable risk for the procedure.  PMHx:  Past Medical History:  Diagnosis Date  . Coronary artery disease   . Dyslipidemia   . H/O cardiovascular stress test 10/19/2012   MET test - good effort, peak VO2 of almost 112% predicted, HR 79% predicted, mildly ischemia at end of exercise  . History of hyperthyroidism as a child   . History of nuclear stress test 03/2011   bruce myoview; mild perfusion defect in mid anteroseptal, apical septal, apical regions (infarct/scar/breast attenuation); post-stress EF 55%; no signficant ischemia  . Ischemic cardiomyopathy    history of   . Myocardial infarction ([place])   . PONV (postoperative nausea and vomiting)   . Primary localized osteoarthritis of right knee   . S/P CABG (coronary artery bypass graft) 2000  . 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 Wears glasses     Past Surgical History:  Procedure Laterality Date  . A-FLUTTER ABLATION N/A 10/01/2018   Procedure: A-FLUTTER ABLATION;  Surgeon: Evans Lance, MD;  Location: [facility];  Service: Cardiovascular;  Laterality: N/A;  . A-FLUTTER ABLATION N/A 11/02/2018   Procedure: A-FLUTTER ABLATION;  Surgeon: Evans Lance, MD;  Location: [facility];  Service: Cardiovascular;  Laterality: N/A;  . CARDIAC CATHETERIZATION  06/01/2007   no obstructive CAD, patent grafts, EF 40%, apical hypokinesis, anterolateral hypokinesis (Dr. Gerrie Nordmann)   . CERVICAL SPINE SURGERY    . CHOLECYSTECTOMY    . COLONOSCOPY    . CORONARY ARTERY BYPASS GRAFT   06/05/1999   LIMA to LAD, SVG to diagonal 1, SVG to PDA of Cfx, right radial graft to OM1 (Dr. Remus Loffler)   . ESOPHAGOGASTRODUODENOSCOPY    . EYE SURGERY    . KNEE SURGERY Right    pt. states x 6   . TOTAL KNEE ARTHROPLASTY Right 07/23/2015   Procedure: RIGHT TOTAL KNEE ARTHROPLASTY;  Surgeon: Elsie Saas, MD;  Location: [place];  Service: Orthopedics;  Laterality: Right;  . TRANSTHORACIC ECHOCARDIOGRAM  02/2008   EF 40%, severe apical wall hypokinesis, mod-severe anterior wall hypokinesis; RV mildly dilated; mild mitral annular calcif; mild TR, RSVP 30-54mHg    FAMHx:  Family History  Problem Relation Age of Onset  . CAD Father        s/p CABGx4 at 744 . Hyperlipidemia Father     SOCHx:   reports that he has quit smoking. His smoking use included cigars. He has never used smokeless tobacco. He reports  current alcohol use. He reports that he does not use drugs.  ALLERGIES:  Allergies  Allergen Reactions  . Morphine And Related Nausea And Vomiting  . Crestor [Rosuvastatin]     Myalgias   . Vytorin [Ezetimibe-Simvastatin]     Myalgias     ROS: Pertinent items noted in HPI and remainder of comprehensive ROS otherwise negative.  HOME MEDS: Current Outpatient Medications  Medication Sig Dispense Refill  . aspirin 81 MG tablet Take 81 mg by mouth daily.    . carvedilol (COREG) 12.5 MG tablet Take 12.5 mg by mouth 2 (two) times daily with a meal.    . cyclobenzaprine (FLEXERIL) 10 MG tablet Take 10 mg by mouth 3 (three) times daily as needed for muscle spasms.    . fenofibrate 54 MG tablet Take 54 mg by mouth daily.    Marland Kitchen losartan (COZAAR) 100 MG tablet Take 100 mg by mouth daily.    Marland Kitchen PROAIR HFA 108 (90 BASE) MCG/ACT inhaler Inhale 1-2 puffs into the lungs every 6 (six) hours as needed for wheezing or shortness of breath.     Marland Kitchen rOPINIRole (REQUIP) 1 MG tablet Take 1 mg by mouth 6 (six) times daily.     . sildenafil (VIAGRA) 100 MG tablet TAKE 1 TABLET BY MOUTH AS NEEDED BEFORE  INTERCOURSE    . traZODone (DESYREL) 50 MG tablet Take 50 mg by mouth at bedtime as needed for sleep.   0  . PROLENSA 0.07 % SOLN     . traMADol (ULTRAM) 50 MG tablet      No current facility-administered medications for this visit.     LABS/IMAGING: No results found for this or any previous visit (from the past 48 hour(s)). No results found.  VITALS: BP 126/80   Pulse 60   Ht '5\' 8"'  (1.727 m)   Wt 244 lb (110.7 kg)   BMI 37.10 kg/m   EXAM: General appearance: alert and no distress Neck: no carotid bruit and no JVD Lungs: clear to auscultation bilaterally Heart: regular rate and rhythm, S1, S2 normal, no murmur, click, rub or gallop Abdomen: soft, non-tender; bowel sounds normal; no masses,  no organomegaly Extremities: extremities normal, atraumatic, no cyanosis or edema Pulses: 2+ and symmetric Skin: Skin color, texture, turgor normal. No rashes or lesions Neurologic: Grossly normal Psych: Mood, affect normal  EKG: Deferred  ASSESSMENT: 1. Atrial flutter s/p ablation - off anticoagulation 2. Coronary artery disease status post four-vessel CABG in 2000 3. Dyslipidemia-intolerant to statins 4. Possible gout 5. History of ischemic cardiomyopathy, EF 40% now improved to 55% 6. Status post right TKR 7. Acceptable risk for left THR  PLAN: 1.   Mr. Rieves had recent atrial flutter ablation and is now off anticoagulation.  He has had no recurrence that he is aware of.  Heart rate is in the low 60s.  He denies any chest pain or shortness of breath.  He is at acceptable risk for planned upcoming right hip replacement.  No further testing is necessary.  Follow-up with me annually or sooner as necessary.  Pixie Casino, MD, [facility] [place] Director of the [facility] Lipid Disorders &  Cardiovascular Risk Reduction Clinic Diplomate of the American Board of Clinical Lipidology Attending Cardiologist  Direct Dial: [phone]  Fax:  [phone]  Website:  [email or web address]   Lee Mclaughlin 12/27/2018, 7:59 AM

## 2018-12-27 NOTE — Patient Instructions (Signed)
Medication Instructions:  Continue current medications If you need a refill on your cardiac medications before your next appointment, please call your pharmacy.   Follow-Up: At [facility], you and your health needs are our priority.  As part of our continuing mission to provide you with exceptional heart care, we have created designated Provider Care Teams.  These Care Teams include your primary Cardiologist (physician) and Advanced Practice Providers (APPs -  Physician Assistants and Nurse Practitioners) who all work together to provide you with the care you need, when you need it. You will need a follow up appointment in 12 months.  Please call our office 2 months in advance to schedule this appointment.  You may see Kenneth C Hilty, MD or one of the following Advanced Practice Providers on your designated Care Team: Hao Meng, PA-C . Angela Duke, PA-C  Any Other Special Instructions Will Be Listed Below (If Applicable).    

## 2018-12-29 NOTE — Telephone Encounter (Signed)
   Primary Cardiologist: Chrystie Nose, MD  Chart reviewed as part of pre-operative protocol coverage. Given past medical history and time since last visit, based on ACC/AHA guidelines, REMON LEVINE would be at acceptable risk for the planned procedure without further cardiovascular testing.   OK to hold ASA 3 -5 days pre op if needed.  I will route this recommendation to the requesting party via Epic fax function and remove from pre-op pool.  Please call with questions.  Corine Shelter, PA-C 12/29/2018, 10:31 AM

## 2019-01-18 DIAGNOSIS — H25811 Combined forms of age-related cataract, right eye: Secondary | ICD-10-CM | POA: Diagnosis not present

## 2019-01-18 DIAGNOSIS — H2511 Age-related nuclear cataract, right eye: Secondary | ICD-10-CM | POA: Diagnosis not present

## 2019-01-19 DIAGNOSIS — H2512 Age-related nuclear cataract, left eye: Secondary | ICD-10-CM | POA: Diagnosis not present

## 2019-01-19 DIAGNOSIS — H25012 Cortical age-related cataract, left eye: Secondary | ICD-10-CM | POA: Diagnosis not present

## 2019-01-19 DIAGNOSIS — H25042 Posterior subcapsular polar age-related cataract, left eye: Secondary | ICD-10-CM | POA: Diagnosis not present

## 2019-01-25 DIAGNOSIS — H2512 Age-related nuclear cataract, left eye: Secondary | ICD-10-CM | POA: Diagnosis not present

## 2019-01-25 DIAGNOSIS — H25812 Combined forms of age-related cataract, left eye: Secondary | ICD-10-CM | POA: Diagnosis not present

## 2019-01-28 ENCOUNTER — Telehealth: Payer: Self-pay | Admitting: Internal Medicine

## 2019-01-28 NOTE — Telephone Encounter (Signed)
Patient was made aware that clearance was given, advised that I could refax to make sure they received the information from our office.  Patient verbalized understanding, had no other questions.

## 2019-01-28 NOTE — Telephone Encounter (Signed)
New Message   PT is calling and says he needs a letter Of cardiac clearance for his hip surgery. He says he already had his procedure for eye surgery. He states he was cleared by Dr Rennis Golden for both procedures but he needs a clearance letter to have the hip surgery. He would like to pick it up in the office. Please call back

## 2019-02-16 ENCOUNTER — Telehealth: Payer: Self-pay | Admitting: Internal Medicine

## 2019-02-16 DIAGNOSIS — E785 Hyperlipidemia, unspecified: Secondary | ICD-10-CM | POA: Diagnosis not present

## 2019-02-16 DIAGNOSIS — G4700 Insomnia, unspecified: Secondary | ICD-10-CM | POA: Diagnosis not present

## 2019-02-16 DIAGNOSIS — Z01818 Encounter for other preprocedural examination: Secondary | ICD-10-CM | POA: Diagnosis not present

## 2019-02-16 DIAGNOSIS — I10 Essential (primary) hypertension: Secondary | ICD-10-CM | POA: Diagnosis not present

## 2019-02-16 NOTE — Telephone Encounter (Signed)
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

## 2019-02-16 NOTE — Telephone Encounter (Signed)
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

## 2019-02-17 DIAGNOSIS — I10 Essential (primary) hypertension: Secondary | ICD-10-CM | POA: Diagnosis not present

## 2019-02-17 NOTE — Telephone Encounter (Signed)
That's a personal decision. He is at increased risk of complications if he gets infected given his age and history of coronary disease. So far, [facility] is not known to be endemic with infection, however, there is a large concentration of people in that area, so potential for risk is higher. I would recommend following the current CDC recommendations - that is, non-essential travel should probably be avoided.  Dr. Rennis Golden

## 2019-02-18 NOTE — Telephone Encounter (Signed)
Returned call to patient Dr.Hilty's recommendation given. 

## 2019-02-24 ENCOUNTER — Inpatient Hospital Stay (HOSPITAL_COMMUNITY): Admission: RE | Admit: 2019-02-24 | Payer: PPO | Source: Ambulatory Visit

## 2019-02-25 ENCOUNTER — Encounter: Payer: Self-pay | Admitting: Gastroenterology

## 2019-03-08 ENCOUNTER — Encounter (HOSPITAL_COMMUNITY): Admission: RE | Payer: Self-pay | Source: Home / Self Care

## 2019-03-08 ENCOUNTER — Inpatient Hospital Stay (HOSPITAL_COMMUNITY): Admission: RE | Admit: 2019-03-08 | Payer: PPO | Source: Home / Self Care | Admitting: Orthopedic Surgery

## 2019-03-08 SURGERY — ARTHROPLASTY, HIP, TOTAL,POSTERIOR APPROACH
Anesthesia: Choice | Laterality: Right

## 2019-03-10 ENCOUNTER — Other Ambulatory Visit (HOSPITAL_COMMUNITY): Payer: PPO

## 2019-03-16 ENCOUNTER — Telehealth: Payer: Self-pay

## 2019-03-16 NOTE — Telephone Encounter (Signed)
TELEPHONE CALL NOTE  Lee Mclaughlin has been deemed a candidate for a follow-up tele-health visit to limit community exposure during the Covid-19 pandemic. I spoke with the patient via phone to ensure availability of phone/video source, confirm preferred email & phone number, and discuss instructions and expectations.  I reminded Lee Mclaughlin to be prepared with any vital sign and/or heart rhythm information that could potentially be obtained via home monitoring, at the time of his visit. I reminded Lee Mclaughlin to expect a phone call at the time of his visit if his visit.  Did the patient verbally acknowledge consent to treatment? Yes  Parke Poisson, RN 03/16/2019 4:18 PM   DOWNLOADING THE WEBEX SOFTWARE TO SMARTPHONE  - If Apple, go to [employer] and type in WebEx in the search bar. Download [employer], the blue/green circle. The app is free but as with any other app downloads, their phone may require them to verify saved payment information or Apple password. The patient does NOT have to create an account.  - If Android, ask patient to go to [employer] and type in WebEx in the search bar. Download [employer], the blue/green circle. The app is free but as with any other app downloads, their phone may require them to verify saved payment information or Android password. The patient does NOT have to create an account.   CONSENT FOR TELE-HEALTH VISIT - PLEASE REVIEW  I hereby voluntarily request, consent and authorize [facility] and its employed or contracted physicians, physician assistants, nurse practitioners or other licensed health care professionals (the Practitioner), to provide me with telemedicine health care services (the "Services") as deemed necessary by the treating Practitioner. I acknowledge and consent to receive the Services by the Practitioner via telemedicine. I understand that the telemedicine visit will involve communicating with the  Practitioner through live audiovisual communication technology and the disclosure of certain medical information by electronic transmission. I acknowledge that I have been given the opportunity to request an in-person assessment or other available alternative prior to the telemedicine visit and am voluntarily participating in the telemedicine visit.  I understand that I have the right to withhold or withdraw my consent to the use of telemedicine in the course of my care at any time, without affecting my right to future care or treatment, and that the Practitioner or I may terminate the telemedicine visit at any time. I understand that I have the right to inspect all information obtained and/or recorded in the course of the telemedicine visit and may receive copies of available information for a reasonable fee.  I understand that some of the potential risks of receiving the Services via telemedicine include:  Marland Kitchen Delay or interruption in medical evaluation due to technological equipment failure or disruption; . Information transmitted may not be sufficient (e.g. poor resolution of images) to allow for appropriate medical decision making by the Practitioner; and/or  . In rare instances, security protocols could fail, causing a breach of personal health information.  Furthermore, I acknowledge that it is my responsibility to provide information about my medical history, conditions and care that is complete and accurate to the best of my ability. I acknowledge that Practitioner's advice, recommendations, and/or decision may be based on factors not within their control, such as incomplete or inaccurate data provided by me or distortions of diagnostic images or specimens that may result from electronic transmissions. I understand that the practice of medicine is not an  exact science and that Practitioner makes no warranties or guarantees regarding treatment outcomes. I acknowledge that I will receive a copy of this  consent concurrently upon execution via email to the email address I last provided but may also request a printed copy by calling the office of [facility].    I understand that my insurance will be billed for this visit.   I have read or had this consent read to me. . I understand the contents of this consent, which adequately explains the benefits and risks of the Services being provided via telemedicine.  . I have been provided ample opportunity to ask questions regarding this consent and the Services and have had my questions answered to my satisfaction. . I give my informed consent for the services to be provided through the use of telemedicine in my medical care  By participating in this telemedicine visit I agree to the above.

## 2019-03-21 ENCOUNTER — Encounter: Payer: Self-pay | Admitting: Internal Medicine

## 2019-03-21 ENCOUNTER — Telehealth (INDEPENDENT_AMBULATORY_CARE_PROVIDER_SITE_OTHER): Payer: PPO | Admitting: Internal Medicine

## 2019-03-21 VITALS — HR 73 | Ht 68.0 in | Wt 238.0 lb

## 2019-03-21 DIAGNOSIS — I2510 Atherosclerotic heart disease of native coronary artery without angina pectoris: Secondary | ICD-10-CM | POA: Diagnosis not present

## 2019-03-21 DIAGNOSIS — I255 Ischemic cardiomyopathy: Secondary | ICD-10-CM

## 2019-03-21 DIAGNOSIS — M25551 Pain in right hip: Secondary | ICD-10-CM | POA: Diagnosis not present

## 2019-03-21 DIAGNOSIS — Z789 Other specified health status: Secondary | ICD-10-CM | POA: Diagnosis not present

## 2019-03-21 DIAGNOSIS — I4892 Unspecified atrial flutter: Secondary | ICD-10-CM | POA: Diagnosis not present

## 2019-03-21 DIAGNOSIS — Z951 Presence of aortocoronary bypass graft: Secondary | ICD-10-CM

## 2019-03-21 DIAGNOSIS — E782 Mixed hyperlipidemia: Secondary | ICD-10-CM | POA: Insufficient documentation

## 2019-03-21 DIAGNOSIS — Z7189 Other specified counseling: Secondary | ICD-10-CM

## 2019-03-21 DIAGNOSIS — M1611 Unilateral primary osteoarthritis, right hip: Secondary | ICD-10-CM | POA: Diagnosis not present

## 2019-03-21 DIAGNOSIS — G720 Drug-induced myopathy: Secondary | ICD-10-CM

## 2019-03-21 NOTE — Addendum Note (Signed)
Addended by: Evans Lance on: 03/21/2019 12:24 PM   Modules accepted: Orders

## 2019-03-21 NOTE — Patient Instructions (Signed)
Medication Instructions:  START Repatha 140---1 injection every 2 weeks.  If you need a refill on your cardiac medications before your next appointment, please call your pharmacy.   Lab work: Your physician recommends that you return for a FASTING lipid profile in 4 months (prior to visit with Dr. Rennis Golden).  If you have labs (blood work) drawn today and your tests are completely normal, you will receive your results only by: Marland Kitchen MyChart Message (if you have MyChart) OR . A paper copy in the mail If you have any lab test that is abnormal or we need to change your treatment, we will call you to review the results.  Follow-Up: At [facility], you and your health needs are our priority.  As part of our continuing mission to provide you with exceptional heart care, we have created designated Provider Care Teams.  These Care Teams include your primary Cardiologist (physician) and Advanced Practice Providers (APPs -  Physician Assistants and Nurse Practitioners) who all work together to provide you with the care you need, when you need it. You will need a follow up appointment in 4 months.  Please call our office 2 months in advance to schedule this appointment.  You may see Chrystie Nose, MD or one of the following Advanced Practice Providers on your designated Care Team: [place], [place] . Micah Flesher, PA-C  Any Other Special Instructions Will Be Listed Below (If Applicable). None

## 2019-03-21 NOTE — Progress Notes (Signed)
Virtual Visit via Video Note   This visit type was conducted due to national recommendations for restrictions regarding the COVID-19 Pandemic (e.g. social distancing) in an effort to limit this patient's exposure and mitigate transmission in our community.  Due to his co-morbid illnesses, this patient is at least at moderate risk for complications without adequate follow up.  This format is felt to be most appropriate for this patient at this time.  All issues noted in this document were discussed and addressed.  A limited physical exam was performed with this format.  Please refer to the patient's chart for his consent to telehealth for [facility].   Evaluation Performed:  Video visit for dyslipidemia  Date:  03/21/2019   ID:  Lee Mclaughlin, DOB 02-Feb-1951, MRN [id number]  Patient Location:  [address] [place] [address]  Provider location:   [address], [address] [place], [place] [address]  PCP:  [name], MD  Cardiologist:  Pixie Casino, MD Electrophysiologist:  None   Chief Complaint:  Dyslipidemia, statin intolerance  History of Present Illness:    Lee Mclaughlin is a 68 y.o. male who presents via audio/video conferencing for a telehealth visit today.  Lee Mclaughlin is a longstanding patient of mine with a history of coronary artery disease status post CABG x4 in 2000, dyslipidemia and statin intolerance (no interest in alternatives in the past), history of ischemic cardiomyopathy with EF 40% improved up to 55%, status post right total knee replacement and left total hip replacement, and recent atrial flutter status post ablation with touchup-off anticoagulation.  Overall Lee Mclaughlin is now feeling well.  He was seen in video visit today due to the COVID-19 pandemic.  Recently I received labs from his PCP that were drawn in early March 2020.  This showed persistent marked dyslipidemia with LDL of 151, total cholesterol 210, triglycerides 188 and HDL 47.  He is on  low-dose fenofibrate 54 mg daily.  He has been intolerant to both rosuvastatin and Vytorin in the past.  He is also evidently tried a atorvastatin.  He is not interested in taking a statin medication.  We have previously discussed his goal LDL to be less than 70 in the past, but he has been dealing with orthopedic issues as well as atrial flutter.  Now that these are better squared away we readdressed his lipid profile.  He understands that he is at increased risk for cardiovascular events being untreated at this point with elevated cholesterol.  We discussed newer options that were available to him including PCSK9 inhibitors, Vascepa and the newly FDA approved bempedoic acid.  After discussing these alternatives, he seemed agreeable to proceed with a PCSK9 inhibitor.  The patient does not have symptoms concerning for COVID-19 infection (fever, chills, cough, or new SHORTNESS OF BREATH).    Prior CV studies:   The following studies were reviewed today:  Recent lab work including lipid profile  PMHx:  Past Medical History:  Diagnosis Date  . Coronary artery disease   . Dyslipidemia   . H/O cardiovascular stress test 10/19/2012   MET test - good effort, peak VO2 of almost 112% predicted, HR 79% predicted, mildly ischemia at end of exercise  . History of hyperthyroidism as a child   . History of nuclear stress test 03/2011   bruce myoview; mild perfusion defect in mid anteroseptal, apical septal, apical regions (infarct/scar/breast attenuation); post-stress EF 55%; no signficant ischemia  . Ischemic cardiomyopathy    history  of   . Myocardial infarction ([place])   . PONV (postoperative nausea and vomiting)   . Primary localized osteoarthritis of right knee   . S/P CABG (coronary artery bypass graft) 2000  . 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 Wears glasses     Past Surgical History:  Procedure Laterality Date  . A-FLUTTER ABLATION N/A 10/01/2018   Procedure: A-FLUTTER  ABLATION;  Surgeon: Evans Lance, MD;  Location: [facility];  Service: Cardiovascular;  Laterality: N/A;  . A-FLUTTER ABLATION N/A 11/02/2018   Procedure: A-FLUTTER ABLATION;  Surgeon: Evans Lance, MD;  Location: [facility];  Service: Cardiovascular;  Laterality: N/A;  . CARDIAC CATHETERIZATION  06/01/2007   no obstructive CAD, patent grafts, EF 40%, apical hypokinesis, anterolateral hypokinesis (Dr. Gerrie Nordmann)   . CERVICAL SPINE SURGERY    . CHOLECYSTECTOMY    . COLONOSCOPY    . CORONARY ARTERY BYPASS GRAFT  06/05/1999   LIMA to LAD, SVG to diagonal 1, SVG to PDA of Cfx, right radial graft to OM1 (Dr. Remus Loffler)   . ESOPHAGOGASTRODUODENOSCOPY    . EYE SURGERY    . KNEE SURGERY Right    pt. states x 6   . TOTAL KNEE ARTHROPLASTY Right 07/23/2015   Procedure: RIGHT TOTAL KNEE ARTHROPLASTY;  Surgeon: Elsie Saas, MD;  Location: [facility];  Service: Orthopedics;  Laterality: Right;  . TRANSTHORACIC ECHOCARDIOGRAM  02/2008   EF 40%, severe apical wall hypokinesis, mod-severe anterior wall hypokinesis; RV mildly dilated; mild mitral annular calcif; mild TR, RSVP 30-60mHg    FAMHx:  Family History  Problem Relation Age of Onset  . CAD Father        s/p CABGx4 at 746 . Hyperlipidemia Father     SOCHx:   reports that he has quit smoking. His smoking use included cigars. He has never used smokeless tobacco. He reports current alcohol use. He reports that he does not use drugs.  ALLERGIES:  Allergies  Allergen Reactions  . Morphine And Related Nausea And Vomiting  . Crestor [Rosuvastatin]     Myalgias   . Vytorin [Ezetimibe-Simvastatin]     Myalgias     MEDS:  Current Meds  Medication Sig  . aspirin 81 MG tablet Take 81 mg by mouth daily.  . carvedilol (COREG) 12.5 MG tablet Take 12.5 mg by mouth 2 (two) times daily with a meal.  . cyclobenzaprine (FLEXERIL) 10 MG tablet Take 10 mg by mouth 3 (three) times daily as needed for muscle spasms.  . fenofibrate 54  MG tablet Take 54 mg by mouth daily.  .Marland Kitchenibuprofen (ADVIL,MOTRIN) 200 MG tablet Take 400 mg by mouth every 6 (six) hours as needed.  .Marland Kitchenlosartan (COZAAR) 100 MG tablet Take 100 mg by mouth daily.  .Marland KitchenPROAIR HFA 108 (90 BASE) MCG/ACT inhaler Inhale 1-2 puffs into the lungs every 6 (six) hours as needed for wheezing or shortness of breath.   .Marland KitchenrOPINIRole (REQUIP) 1 MG tablet Take 1 mg by mouth 6 (six) times daily.   . sildenafil (VIAGRA) 100 MG tablet Take 100 mg by mouth as needed for erectile dysfunction.   . traMADol (ULTRAM) 50 MG tablet Take 50 mg by mouth every 6 (six) hours as needed for moderate pain.   . traZODone (DESYREL) 50 MG tablet Take 50 mg by mouth at bedtime as needed for sleep.      ROS: Pertinent items noted in HPI and remainder of comprehensive ROS otherwise  negative.  Labs/Other Tests and Data Reviewed:    Recent Labs: 09/08/2018: TSH 3.754 10/26/2018: BUN 42; Creatinine, Ser 1.26; Hemoglobin 17.5; Platelets 190; Potassium 4.6; Sodium 136   Recent Lipid Panel Lab Results  Component Value Date/Time   CHOL 240 (H) 12/14/2014 08:44 AM   TRIG 194 (H) 12/14/2014 08:44 AM   HDL 46 12/14/2014 08:44 AM   LDLCALC 155 (H) 12/14/2014 08:44 AM    Wt Readings from Last 3 Encounters:  03/21/19 238 lb (108 kg)  12/27/18 244 lb (110.7 kg)  12/15/18 238 lb (108 kg)     Exam:    Vital Signs:  Pulse 73   Ht _0  (1.727 m)   Wt 238 lb (108 kg)   BMI 36.19 kg/m    General appearance: alert and no distress Lungs: no obvious breathing difficulty Extremities: extremities normal, atraumatic, no cyanosis or edema Skin: Skin color, texture, turgor normal. No rashes or lesions Neurologic: Mental status: Alert, oriented, thought content appropriate Psych: Pleasant  ASSESSMENT & PLAN:    1. Atrial flutter s/p ablation - off anticoagulation 2. Coronary artery disease status post four-vessel CABG in 2000 3. Dyslipidemia-intolerant to statins 4. Possible gout 5. History of  ischemic cardiomyopathy, EF 40% now improved to 55% 6. Status post right TKR 7. Acceptable risk for left THR  Mr. Dowson has had persistent dyslipidemia with no interest in trying additional statins due to side effects in the past.  We discussed alternative options for cholesterol control as his target LDL is less than 70.  He needs a greater than 50% reduction in LDL cholesterol and is likely to achieve that on a PCSK9 inhibitor.  I would recommend starting Repatha.  I discussed the use of the medication as well as its clinical properties, side effects, method of administration and cost issues today.  He is interested in proceeding.  We will go ahead and prescribe him the 140 mg every 2 week dosing.  Plan a repeat lipid profile in about 4 months and follow-up with me afterwards.  COVID-19 Education: The signs and symptoms of COVID-19 were discussed with the patient and how to seek care for testing (follow up with PCP or arrange E-visit).  The importance of social distancing was discussed today.  Patient Risk:   After full review of this patients clinical status, I feel that they are at least moderate risk at this time.  Time:   Today, I have spent 25 minutes with the patient with telehealth technology discussing CAD, recent atrial flutter ablation, dyslipidemia and target LDL, statin intolerance, medication options and side-effects.     Medication Adjustments/Labs and Tests Ordered: Current medicines are reviewed at length with the patient today.  Concerns regarding medicines are outlined above.   Tests Ordered: No orders of the defined types were placed in this encounter.   Medication Changes: No orders of the defined types were placed in this encounter.   Disposition:  in 4 month(s)  Pixie Casino, MD, [facility], [place] Director of the Advanced Lipid Disorders &  Cardiovascular Risk Reduction Clinic Diplomate of the American Board of Clinical  Lipidology Attending Cardiologist  Direct Dial: [phone]  Fax: [phone]  Website:  [email or web address]  Pixie Casino, MD  03/21/2019 12:08 PM

## 2019-03-28 ENCOUNTER — Telehealth: Payer: Self-pay | Admitting: Internal Medicine

## 2019-03-28 NOTE — Telephone Encounter (Signed)
New Message:  Patient calling concerning some medication that he was going to received, and states it is not at his pharmacy. Please call patient.

## 2019-03-29 NOTE — Telephone Encounter (Signed)
Called the patient to make him aware that it may take about a month to get an approval to start the drug and that I hadn't started a pa for him yet because I was not notified that he needed one I will work on this from home

## 2019-04-04 DIAGNOSIS — M1611 Unilateral primary osteoarthritis, right hip: Secondary | ICD-10-CM | POA: Diagnosis not present

## 2019-04-04 DIAGNOSIS — M25551 Pain in right hip: Secondary | ICD-10-CM | POA: Diagnosis not present

## 2019-04-05 ENCOUNTER — Telehealth: Payer: Self-pay | Admitting: Pharmacist

## 2019-04-05 MED ORDER — EVOLOCUMAB 140 MG/ML ~~LOC~~ SOAJ: 140.0000 mg | SUBCUTANEOUS | 11 refills | Status: DC

## 2019-04-05 NOTE — Telephone Encounter (Signed)
PA for repatha approved. Rx sen to pharmacy.  Patient will call back if unable to afford co-pay

## 2019-04-06 NOTE — Telephone Encounter (Signed)
Pt called and said the medication was $100/mo and did not have any income coming in right now to pay it

## 2019-04-08 ENCOUNTER — Telehealth: Payer: Self-pay | Admitting: Pharmacist

## 2019-04-08 NOTE — Telephone Encounter (Signed)
See previous phone message for update

## 2019-04-08 NOTE — Telephone Encounter (Signed)
Talked to patient today.   Medicare does nor qualify for co-pay card. I mailed mailed paperwork for [facility] safetyNet foundation to patient.  He is to complete and return back to office ASAP.

## 2019-04-08 NOTE — Telephone Encounter (Signed)
Follow Up:    Pt is calling back to see if you have any discount cards to help with his Repatha please?

## 2019-04-26 ENCOUNTER — Telehealth: Payer: Self-pay | Admitting: Internal Medicine

## 2019-04-26 NOTE — Telephone Encounter (Signed)
Faxed application for patient assistance to [employer] @ [phone]

## 2019-05-06 ENCOUNTER — Telehealth: Payer: Self-pay | Admitting: Internal Medicine

## 2019-05-06 NOTE — Telephone Encounter (Signed)
New Message ° ° ° °Pt is returning call  ° ° ° °Please call back  °

## 2019-05-06 NOTE — Telephone Encounter (Signed)
LMTCB to discuss copay for Repatha & patient assistance options since he is denied from [employer]

## 2019-05-06 NOTE — Telephone Encounter (Signed)
Addressed in another phone note. Lab order mailed

## 2019-05-06 NOTE — Telephone Encounter (Signed)
Spoke with patient who reports he has taken 2 doses of Repatha that he bought for $100/month with stimulus check. He is on fixed income. He is not bringing in income right now d/t coronavirus. He would like to continue taking for at least another month and will have labs checked at the end of this time period to see effectiveness. Rx sent to pharmacy & lab order mailed.

## 2019-05-06 NOTE — Telephone Encounter (Signed)
Patient is returning your call regarding repatha.

## 2019-05-06 NOTE — Telephone Encounter (Signed)
From [employer] on 04/29/2019 - patient is not eligible for patient assistance thru company as insurance plan indicated they have insurance coverage for the medication. Patient can contact Repatha Ready @ [phone] to find out about other programs. Patient could also use [email or web address] if co-pay is not afforable.

## 2019-05-13 NOTE — Progress Notes (Signed)
12/27/2018- noted in Epic-Office note with Dr. Zoila Shutter  12/23/2018- noted in Epic- note actually signed on 12/29/2018 by Sherre Poot for Cardiac Clearance  12/15/2018- noted in EPIc-EKG  11/02/2018- noted in Epic-A-Flutter Ablation  10/01/2018- noted in Epic-A-Flutter ablation  09/28/2018- noted in [facility]

## 2019-05-13 NOTE — Patient Instructions (Addendum)
Lee Mclaughlin  05/13/2019   Your procedure is scheduled on: Tuesday 05/24/2019  Report to [facility] Main  Entrance              Report to admitting at   0800 AM               YOU NEED TO HAVE A COVID 19 TEST ON 05/20/19 @ 3:00 PM, THIS TEST MUST BE DONE BEFORE SURGERY, COME TO [facility] ENTRANCE. ONCE YOUR TEST IS COMPLETED, PLEASE BEING THE QUARANTINE INSTRUCTIONS AS OUTLINED IN YOUR HANDOUT.   Call this number if you have problems the morning of surgery [phone]    Remember: Do not eat food  :After Midnight.              NO SOLID FOOD AFTER MIDNIGHT THE NIGHT PRIOR TO SURGERY. NOTHING BY MOUTH EXCEPT CLEAR LIQUIDS UNTIL  0430 am.               PLEASE FINISH ENSURE DRINK PER SURGEON ORDER  WHICH NEEDS TO BE COMPLETED AT   0430 am.   CLEAR LIQUID DIET   Foods Allowed                                                                     Foods Excluded  Coffee and tea, regular and decaf                             liquids that you cannot  Plain Jell-O in any flavor                                             see through such as: Fruit ices (not with fruit pulp)                                     milk, soups, orange juice  Iced Popsicles                                    All solid food Carbonated beverages, regular and diet                                    Cranberry, grape and apple juices Sports drinks like Gatorade Lightly seasoned clear broth or consume(fat free) Sugar, honey syrup  Sample Menu Breakfast                                Lunch                                     Supper Cranberry juice  Beef broth                            Chicken broth Jell-O                                     Grape juice                           Apple juice Coffee or tea                        Jell-O                                      Popsicle                                                Coffee or tea                         Coffee or tea  _____________________________________________________________________               BRUSH YOUR TEETH MORNING OF SURGERY AND RINSE YOUR MOUTH OUT, NO CHEWING GUM CANDY OR MINTS.     Take these medicines the morning of surgery with A SIP OF WATER: Carvedilol (Coreg), Ropinirole (Requip).You can use your Pro-Air inhaler if needed and bring inhaler to the hospital                                You may not have any metal on your body including hair pins and              piercings     Do not wear jewelry, cologne, lotions, powders or deodorant                       Men may shave face and neck.   Do not bring valuables to the hospital. [facility] IS NOT             RESPONSIBLE   FOR VALUABLES.  Contacts, dentures or bridgework may not be worn into surgery.                   Please read over the following fact sheets you were given: _____________________________________________________________________         [facility] - Preparing for Surgery Before surgery, you can play an important role.  Because skin is not sterile, your skin needs to be as free of germs as possible.  You can reduce the number of germs on your skin by washing with CHG (chlorahexidine gluconate) soap before surgery.  CHG is an antiseptic cleaner which kills germs and bonds with the skin to continue killing germs even after washing. Please DO NOT use if you have an allergy to CHG or antibacterial soaps.  If your skin becomes reddened/irritated stop using the CHG and inform your nurse when you arrive at Short Stay. Do not shave (including legs and underarms) for at least 48  hours prior to the first CHG shower.  You may shave your face/neck. Please follow these instructions carefully:  1.  Shower with CHG Soap the night before surgery and the  morning of Surgery.  2.  If you choose to wash your hair, wash your hair first as usual with your  normal  shampoo.  3.  After you shampoo, rinse your  hair and body thoroughly to remove the  shampoo.                           4.  Use CHG as you would any other liquid soap.  You can apply chg directly  to the skin and wash                       Gently with a scrungie or clean washcloth.  5.  Apply the CHG Soap to your body ONLY FROM THE NECK DOWN.   Do not use on face/ open                           Wound or open sores. Avoid contact with eyes, ears mouth and genitals (private parts).                       Wash face,  Genitals (private parts) with your normal soap.             6.  Wash thoroughly, paying special attention to the area where your surgery  will be performed.  7.  Thoroughly rinse your body with warm water from the neck down.  8.  DO NOT shower/wash with your normal soap after using and rinsing off  the CHG Soap.                9.  Pat yourself dry with a clean towel.            10.  Wear clean pajamas.            11.  Place clean sheets on your bed the night of your first shower and do not  sleep with pets. Day of Surgery : Do not apply any lotions/deodorants the morning of surgery.  Please wear clean clothes to the hospital/surgery center.  FAILURE TO FOLLOW THESE INSTRUCTIONS MAY RESULT IN THE CANCELLATION OF YOUR SURGERY PATIENT SIGNATURE_________________________________  NURSE SIGNATURE__________________________________  ________________________________________________________________________    Lee Mclaughlin  An incentive spirometer is a tool that can help keep your lungs clear and active. This tool measures how well you are filling your lungs with each breath. Taking long deep breaths may help reverse or decrease the chance of developing breathing (pulmonary) problems (especially infection) following:  A long period of time when you are unable to move or be active. BEFORE THE PROCEDURE   If the spirometer includes an indicator to show your best effort, your nurse or respiratory therapist will set it to a  desired goal.  If possible, sit up straight or lean slightly forward. Try not to slouch.  Hold the incentive spirometer in an upright position. INSTRUCTIONS FOR USE  1. Sit on the edge of your bed if possible, or sit up as far as you can in bed or on a chair. 2. Hold the incentive spirometer in an upright position. 3. Breathe out normally. 4. Place the mouthpiece in your mouth and seal your lips tightly around it.  5. Breathe in slowly and as deeply as possible, raising the piston or the ball toward the top of the column. 6. Hold your breath for 3-5 seconds or for as long as possible. Allow the piston or ball to fall to the bottom of the column. 7. Remove the mouthpiece from your mouth and breathe out normally. 8. Rest for a few seconds and repeat Steps 1 through 7 at least 10 times every 1-2 hours when you are awake. Take your time and take a few normal breaths between deep breaths. 9. The spirometer may include an indicator to show your best effort. Use the indicator as a goal to work toward during each repetition. 10. After each set of 10 deep breaths, practice coughing to be sure your lungs are clear. If you have an incision (the cut made at the time of surgery), support your incision when coughing by placing a pillow or rolled up towels firmly against it. Once you are able to get out of bed, walk around indoors and cough well. You may stop using the incentive spirometer when instructed by your caregiver.  RISKS AND COMPLICATIONS  Take your time so you do not get dizzy or light-headed.  If you are in pain, you may need to take or ask for pain medication before doing incentive spirometry. It is harder to take a deep breath if you are having pain. AFTER USE  Rest and breathe slowly and easily.  It can be helpful to keep track of a log of your progress. Your caregiver can provide you with a simple table to help with this. If you are using the spirometer at home, follow these  instructions: SEEK MEDICAL CARE IF:   You are having difficultly using the spirometer.  You have trouble using the spirometer as often as instructed.  Your pain medication is not giving enough relief while using the spirometer.  You develop fever of 100.5 F (38.1 C) or higher. SEEK IMMEDIATE MEDICAL CARE IF:   You cough up bloody sputum that had not been present before.  You develop fever of 102 F (38.9 C) or greater.  You develop worsening pain at or near the incision site. MAKE SURE YOU:   Understand these instructions.  Will watch your condition.  Will get help right away if you are not doing well or get worse. Document Released: 04/06/2007 Document Revised: 02/16/2012 Document Reviewed: 06/07/2007 [facility] Patient Information 2014 [place], [place].   ________________________________________________________________________

## 2019-05-16 ENCOUNTER — Encounter (HOSPITAL_COMMUNITY): Payer: Self-pay

## 2019-05-16 ENCOUNTER — Telehealth: Payer: Self-pay | Admitting: Internal Medicine

## 2019-05-16 ENCOUNTER — Encounter (HOSPITAL_COMMUNITY)
Admission: RE | Admit: 2019-05-16 | Discharge: 2019-05-16 | Disposition: A | Discharge status: Home / Self Care | Payer: PPO | Source: Ambulatory Visit | Attending: Orthopedic Surgery | Admitting: Orthopedic Surgery

## 2019-05-16 ENCOUNTER — Other Ambulatory Visit: Payer: Self-pay

## 2019-05-16 DIAGNOSIS — Z01818 Encounter for other preprocedural examination: Secondary | ICD-10-CM | POA: Insufficient documentation

## 2019-05-16 DIAGNOSIS — Z79899 Other long term (current) drug therapy: Secondary | ICD-10-CM | POA: Diagnosis not present

## 2019-05-16 DIAGNOSIS — E785 Hyperlipidemia, unspecified: Secondary | ICD-10-CM | POA: Insufficient documentation

## 2019-05-16 DIAGNOSIS — M1611 Unilateral primary osteoarthritis, right hip: Secondary | ICD-10-CM | POA: Diagnosis not present

## 2019-05-16 DIAGNOSIS — I2510 Atherosclerotic heart disease of native coronary artery without angina pectoris: Secondary | ICD-10-CM | POA: Diagnosis not present

## 2019-05-16 DIAGNOSIS — I255 Ischemic cardiomyopathy: Secondary | ICD-10-CM | POA: Diagnosis not present

## 2019-05-16 DIAGNOSIS — Z7982 Long term (current) use of aspirin: Secondary | ICD-10-CM | POA: Diagnosis not present

## 2019-05-16 DIAGNOSIS — Z951 Presence of aortocoronary bypass graft: Secondary | ICD-10-CM | POA: Diagnosis not present

## 2019-05-16 DIAGNOSIS — I252 Old myocardial infarction: Secondary | ICD-10-CM | POA: Diagnosis not present

## 2019-05-16 LAB — CBC
HCT: 53.6 % — ABNORMAL HIGH (ref 39.0–52.0)
Hemoglobin: 18.2 g/dL — ABNORMAL HIGH (ref 13.0–17.0)
MCH: 31.3 pg (ref 26.0–34.0)
MCHC: 34 g/dL (ref 30.0–36.0)
MCV: 92.1 fL (ref 80.0–100.0)
Platelets: 210 10*3/uL (ref 150–400)
RBC: 5.82 MIL/uL — ABNORMAL HIGH (ref 4.22–5.81)
RDW: 12.7 % (ref 11.5–15.5)
WBC: 8.8 10*3/uL (ref 4.0–10.5)
nRBC: 0 % (ref 0.0–0.2)

## 2019-05-16 LAB — BASIC METABOLIC PANEL
Anion gap: 11 (ref 5–15)
BUN: 34 mg/dL — ABNORMAL HIGH (ref 8–23)
CO2: 23 mmol/L (ref 22–32)
Calcium: 9.9 mg/dL (ref 8.9–10.3)
Chloride: 104 mmol/L (ref 98–111)
Creatinine, Ser: 1.08 mg/dL (ref 0.61–1.24)
GFR calc Af Amer: >60 mL/min (ref >60)
GFR calc non Af Amer: >60 mL/min (ref >60)
Glucose, Bld: 98 mg/dL (ref 70–99)
Potassium: 4.5 mmol/L (ref 3.5–5.1)
Sodium: 138 mmol/L (ref 135–145)

## 2019-05-16 LAB — SURGICAL PCR SCREEN
MRSA, PCR: NEGATIVE
Staphylococcus aureus: NEGATIVE

## 2019-05-16 NOTE — Telephone Encounter (Signed)
Patient should continue using Repatha as prescribed. Avoid injection the day of surgery. Okay to use any day before or after surgery.

## 2019-05-16 NOTE — Telephone Encounter (Signed)
Called patient, advised of message from PharmD-   Patient did have a question regarding his lab work- he states he was told to have it 2 weeks after he starts it, but he is due to have surgery and will not be able to have it completed, unless done in the hospital- he wanted to know if he waited another two weeks if it would hurt him, or what he should do. Advised I would route to nurse to make aware.

## 2019-05-16 NOTE — Telephone Encounter (Signed)
Please advise. Repatha injection.

## 2019-05-16 NOTE — Telephone Encounter (Signed)
New Message    Pt is calling and says he is having hip replacement surgery on Tuesday.  He says he is wondering if he is suppose to be taking his Repatha before the surgery  And he also said he was suppose come for labs after his next injection  But he doesn't know how all of this will play out when he is having surgery on Tuesday    Please call

## 2019-05-17 NOTE — Telephone Encounter (Signed)
Spoke with patient who will be taking last dose of repatha 6/15. The med is cost-prohibitive and he is planning to have labs done for efficacy to determine if he will continue to take/pay for medication. He is having surgery 6/16 which will limit when he can get labs done. Plan for patient to pick of 1 sample of repatha for 6/29 administration and have labs done after this dose (if he is able to get out post-op). He will call back if additional sample is needed so doses will not be missed.

## 2019-05-18 NOTE — Anesthesia Preprocedure Evaluation (Addendum)
Anesthesia Evaluation  Patient identified by MRN, date of birth, ID band Patient awake    Reviewed: Allergy & Precautions, NPO status , Patient's Chart, lab work & pertinent test results  History of Anesthesia Complications (+) PONV  Airway Mallampati: II  TM Distance: >3 FB Neck ROM: Full    Dental no notable dental hx.    Pulmonary former smoker,    Pulmonary exam normal breath sounds clear to auscultation       Cardiovascular + CAD and + CABG  Normal cardiovascular exam+ dysrhythmias (s/p ablation 2020) Atrial Fibrillation  Rhythm:Regular Rate:Normal     Neuro/Psych negative neurological ROS  negative psych ROS   GI/Hepatic negative GI ROS, Neg liver ROS,   Endo/Other  negative endocrine ROS  Renal/GU negative Renal ROS  negative genitourinary   Musculoskeletal negative musculoskeletal ROS (+)   Abdominal   Peds negative pediatric ROS (+)  Hematology negative hematology ROS (+)   Anesthesia Other Findings   Reproductive/Obstetrics negative OB ROS                            Anesthesia Physical Anesthesia Plan  ASA: III  Anesthesia Plan: Spinal   Post-op Pain Management:    Induction: Intravenous  PONV Risk Score and Plan: 2 and Ondansetron, Dexamethasone and Treatment may vary due to age or medical condition  Airway Management Planned: Simple Face Mask  Additional Equipment:   Intra-op Plan:   Post-operative Plan:   Informed Consent: I have reviewed the patients History and Physical, chart, labs and discussed the procedure including the risks, benefits and alternatives for the proposed anesthesia with the patient or authorized representative who has indicated his/her understanding and acceptance.     Dental advisory given  Plan Discussed with: CRNA  Anesthesia Plan Comments: (See Pat note 05/16/2019, Konrad Felix, PA-C)       Anesthesia Quick Evaluation

## 2019-05-18 NOTE — Progress Notes (Signed)
Anesthesia Chart Review   Case:  [id number] Date/Time:  05/24/19 1015   Procedure:  TOTAL HIP ARTHROPLASTY (Right )   Anesthesia type:  Choice   Pre-op diagnosis:  djd right hip   Location:  [place] [facility]   Surgeon:  Marchia Bond, MD      DISCUSSION: 68 yo former smoker with h/o PONV, CAD (CABG x 4 2000), ischemia cardiomyopathy, dyslipidemia, A-flutter s/p ablation, DJD right hip scheduled for above procedure 05/24/2019 with Dr. Marchia Bond.   Pt last seen by cardiologist, Dr. Lyman Bishop, 03/21/2019. Cardiac clearance received 12/29/2018.  Per Kerin Ransom, PA-C, "Given past medical history and time since last visit, based on ACC/AHA guidelines, Lee Mclaughlin would be at acceptable risk for the planned procedure without further cardiovascular testing. OK to hold ASA 3 -5 days pre op if needed."  Anticipate pt can proceed with planned procedure barring acute status change.   VS: BP 133/79   Pulse 79   Temp 36.6 C (Oral)   Resp 16   Ht '5\' 8"'  (1.727 m)   Wt 104.6 kg   SpO2 99%   BMI 35.05 kg/m   PROVIDERS: [name], MD is PCP   Lyman Bishop, MD is Cardiologist  LABS: Labs reviewed: Acceptable for surgery. (all labs ordered are listed, but only abnormal results are displayed)  Labs Reviewed  BASIC METABOLIC PANEL - Abnormal; Notable for the following components:      Result Value   BUN 34 (*)    All other components within normal limits  CBC - Abnormal; Notable for the following components:   RBC 5.82 (*)    Hemoglobin 18.2 (*)    HCT 53.6 (*)    All other components within normal limits  SURGICAL PCR SCREEN     IMAGES:   EKG: 12/15/2018 Rate 66 bpm Sinus rhythm with 1st degree AV block  Anteroseptal infarct, age undetermined  CV: Echo 09/28/18 Study Conclusions  - Procedure narrative: Transthoracic echocardiography. Image   quality was adequate. Intravenous contrast (Definity) was   administered to opacify the LV. - Left ventricle: The  cavity size was normal. Systolic function was   severely reduced. The estimated ejection fraction was in the   range of 25% to 30%. Akinesis of the apicalanteroseptal and   apical myocardium. - Pulmonary arteries: Systolic pressure was mildly to moderately   increased. PA peak pressure: 39 mm Hg (S).  Stress Test 05/24/2015 Low risk pharmacological stress nuclear study with a small and mild apicoseptal fixed perfusion defect and normal left ventricular regional and global systolic function.  Past Medical History:  Diagnosis Date  . Coronary artery disease   . Dyslipidemia   . H/O cardiovascular stress test 10/19/2012   MET test - good effort, peak VO2 of almost 112% predicted, HR 79% predicted, mildly ischemia at end of exercise  . History of hyperthyroidism as a child   . History of nuclear stress test 03/2011   bruce myoview; mild perfusion defect in mid anteroseptal, apical septal, apical regions (infarct/scar/breast attenuation); post-stress EF 55%; no signficant ischemia  . Ischemic cardiomyopathy    history of   . Myocardial infarction ([place])   . PONV (postoperative nausea and vomiting)   . Primary localized osteoarthritis of right knee   . S/P CABG (coronary artery bypass graft) 2000  . 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 Wears glasses     Past Surgical History:  Procedure Laterality Date  .  A-FLUTTER ABLATION N/A 10/01/2018   Procedure: A-FLUTTER ABLATION;  Surgeon: Evans Lance, MD;  Location: [place] [facility];  Service: Cardiovascular;  Laterality: N/A;  . A-FLUTTER ABLATION N/A 11/02/2018   Procedure: A-FLUTTER ABLATION;  Surgeon: Evans Lance, MD;  Location: [place] [facility];  Service: Cardiovascular;  Laterality: N/A;  . CARDIAC CATHETERIZATION  06/01/2007   no obstructive CAD, patent grafts, EF 40%, apical hypokinesis, anterolateral hypokinesis (Dr. Gerrie Nordmann)   . CERVICAL SPINE SURGERY    . CHOLECYSTECTOMY    . COLONOSCOPY    . CORONARY  ARTERY BYPASS GRAFT  06/05/1999   LIMA to LAD, SVG to diagonal 1, SVG to PDA of Cfx, right radial graft to OM1 (Dr. Remus Loffler)   . ESOPHAGOGASTRODUODENOSCOPY    . EYE SURGERY    . KNEE SURGERY Right    pt. states x 6   . TOTAL KNEE ARTHROPLASTY Right 07/23/2015   Procedure: RIGHT TOTAL KNEE ARTHROPLASTY;  Surgeon: Elsie Saas, MD;  Location: [place];  Service: Orthopedics;  Laterality: Right;  . TRANSTHORACIC ECHOCARDIOGRAM  02/2008   EF 40%, severe apical wall hypokinesis, mod-severe anterior wall hypokinesis; RV mildly dilated; mild mitral annular calcif; mild TR, RSVP 30-72mHg    MEDICATIONS: . aspirin 81 MG tablet  . carvedilol (COREG) 12.5 MG tablet  . cyclobenzaprine (FLEXERIL) 10 MG tablet  . diphenhydrAMINE (BENADRYL) 25 MG tablet  . Evolocumab (REPATHA SURECLICK) 1256MG/ML SOAJ  . fenofibrate 54 MG tablet  . ibuprofen (ADVIL,MOTRIN) 200 MG tablet  . loratadine (CLARITIN) 10 MG tablet  . losartan (COZAAR) 100 MG tablet  . PROAIR HFA 108 (90 BASE) MCG/ACT inhaler  . rOPINIRole (REQUIP) 1 MG tablet  . traMADol (ULTRAM) 50 MG tablet  . traZODone (DESYREL) 50 MG tablet   No current facility-administered medications for this encounter.    [facility] Pre-Surgical Testing ([phone]/10/20 2:49 PM

## 2019-05-20 ENCOUNTER — Other Ambulatory Visit (HOSPITAL_COMMUNITY)
Admission: RE | Admit: 2019-05-20 | Discharge: 2019-05-20 | Disposition: A | Discharge status: Home / Self Care | Payer: PPO | Source: Ambulatory Visit | Attending: Orthopedic Surgery | Admitting: Orthopedic Surgery

## 2019-05-20 DIAGNOSIS — Z01812 Encounter for preprocedural laboratory examination: Secondary | ICD-10-CM | POA: Diagnosis not present

## 2019-05-20 DIAGNOSIS — Z1159 Encounter for screening for other viral diseases: Secondary | ICD-10-CM | POA: Diagnosis not present

## 2019-05-21 LAB — NOVEL CORONAVIRUS, NAA (HOSP ORDER, SEND-OUT TO REF LAB; TAT 18-24 HRS): SARS-CoV-2, NAA: NOT DETECTED

## 2019-05-23 NOTE — Progress Notes (Signed)
SPOKE W/  Bunnie Domino via phone and reviewed the following questions:   SCREENING SYMPTOMS OF COVID 19:  COUGH--no  RUNNY NOSE--- no  SORE THROAT---no  NASAL CONGESTION----no  SNEEZING----no  SHORTNESS OF BREATH---no  DIFFICULTY BREATHING---no  TEMP >100.0 -----no  UNEXPLAINED BODY ACHES------no  CHILLS -------- no  HEADACHES --------no-  LOSS OF SMELL/ TASTE --------no    HAVE YOU OR ANY FAMILY MEMBER TRAVELLED PAST 14 DAYS OUT OF THE   COUNTY---no STATE----no COUNTRY----no  HAVE YOU OR ANY FAMILY MEMBER BEEN EXPOSED TO ANYONE WITH COVID 19? no

## 2019-05-24 ENCOUNTER — Inpatient Hospital Stay (HOSPITAL_COMMUNITY): Payer: PPO | Admitting: Physician Assistant

## 2019-05-24 ENCOUNTER — Inpatient Hospital Stay (HOSPITAL_COMMUNITY)
Admission: RE | Admit: 2019-05-24 | Discharge: 2019-05-25 | DRG: 470 | Disposition: A | Discharge status: Home / Self Care | Payer: PPO | Attending: Orthopedic Surgery | Admitting: Orthopedic Surgery

## 2019-05-24 ENCOUNTER — Other Ambulatory Visit: Payer: Self-pay

## 2019-05-24 ENCOUNTER — Inpatient Hospital Stay (HOSPITAL_COMMUNITY): Payer: PPO | Admitting: Anesthesiology

## 2019-05-24 ENCOUNTER — Encounter (HOSPITAL_COMMUNITY)
Admission: RE | Disposition: A | Discharge status: Home / Self Care | Payer: Self-pay | Source: Home / Self Care | Attending: Orthopedic Surgery

## 2019-05-24 ENCOUNTER — Inpatient Hospital Stay (HOSPITAL_COMMUNITY): Payer: PPO

## 2019-05-24 ENCOUNTER — Encounter (HOSPITAL_COMMUNITY): Payer: Self-pay | Admitting: *Deleted

## 2019-05-24 DIAGNOSIS — Z1159 Encounter for screening for other viral diseases: Secondary | ICD-10-CM | POA: Diagnosis not present

## 2019-05-24 DIAGNOSIS — Z471 Aftercare following joint replacement surgery: Secondary | ICD-10-CM | POA: Diagnosis not present

## 2019-05-24 DIAGNOSIS — Z9049 Acquired absence of other specified parts of digestive tract: Secondary | ICD-10-CM

## 2019-05-24 DIAGNOSIS — Z7982 Long term (current) use of aspirin: Secondary | ICD-10-CM

## 2019-05-24 DIAGNOSIS — Z6835 Body mass index (BMI) 35.0-35.9, adult: Secondary | ICD-10-CM

## 2019-05-24 DIAGNOSIS — Z951 Presence of aortocoronary bypass graft: Secondary | ICD-10-CM | POA: Diagnosis not present

## 2019-05-24 DIAGNOSIS — M1711 Unilateral primary osteoarthritis, right knee: Secondary | ICD-10-CM | POA: Diagnosis not present

## 2019-05-24 DIAGNOSIS — Z87891 Personal history of nicotine dependence: Secondary | ICD-10-CM

## 2019-05-24 DIAGNOSIS — Z79899 Other long term (current) drug therapy: Secondary | ICD-10-CM

## 2019-05-24 DIAGNOSIS — Z79891 Long term (current) use of opiate analgesic: Secondary | ICD-10-CM | POA: Diagnosis not present

## 2019-05-24 DIAGNOSIS — I252 Old myocardial infarction: Secondary | ICD-10-CM

## 2019-05-24 DIAGNOSIS — M1611 Unilateral primary osteoarthritis, right hip: Secondary | ICD-10-CM | POA: Diagnosis not present

## 2019-05-24 DIAGNOSIS — I2510 Atherosclerotic heart disease of native coronary artery without angina pectoris: Secondary | ICD-10-CM | POA: Diagnosis not present

## 2019-05-24 DIAGNOSIS — M25751 Osteophyte, right hip: Secondary | ICD-10-CM | POA: Diagnosis present

## 2019-05-24 DIAGNOSIS — E785 Hyperlipidemia, unspecified: Secondary | ICD-10-CM | POA: Diagnosis present

## 2019-05-24 DIAGNOSIS — Z96649 Presence of unspecified artificial hip joint: Secondary | ICD-10-CM

## 2019-05-24 DIAGNOSIS — Z888 Allergy status to other drugs, medicaments and biological substances status: Secondary | ICD-10-CM

## 2019-05-24 DIAGNOSIS — Z96641 Presence of right artificial hip joint: Secondary | ICD-10-CM | POA: Diagnosis not present

## 2019-05-24 DIAGNOSIS — Z8249 Family history of ischemic heart disease and other diseases of the circulatory system: Secondary | ICD-10-CM

## 2019-05-24 DIAGNOSIS — E669 Obesity, unspecified: Secondary | ICD-10-CM | POA: Diagnosis not present

## 2019-05-24 DIAGNOSIS — Z885 Allergy status to narcotic agent status: Secondary | ICD-10-CM | POA: Diagnosis not present

## 2019-05-24 DIAGNOSIS — I4891 Unspecified atrial fibrillation: Secondary | ICD-10-CM | POA: Diagnosis not present

## 2019-05-24 HISTORY — DX: Unilateral primary osteoarthritis, right hip: M16.11

## 2019-05-24 HISTORY — PX: TOTAL HIP ARTHROPLASTY: SHX124

## 2019-05-24 SURGERY — ARTHROPLASTY, HIP, TOTAL,POSTERIOR APPROACH
Anesthesia: Spinal | Site: Hip | Laterality: Right

## 2019-05-24 MED ORDER — BISACODYL 10 MG RE SUPP: 10.0000 mg | Freq: Every day | RECTAL | Status: DC | PRN

## 2019-05-24 MED ORDER — KETOROLAC TROMETHAMINE 30 MG/ML IJ SOLN
INTRAMUSCULAR | Status: AC
  Filled 2019-05-24: qty 1

## 2019-05-24 MED ORDER — MEPERIDINE HCL 50 MG/ML IJ SOLN: 6.2500 mg | INTRAMUSCULAR | Status: DC | PRN

## 2019-05-24 MED ORDER — LOSARTAN POTASSIUM 50 MG PO TABS
100.0000 mg | ORAL_TABLET | Freq: Every day | ORAL | Status: DC
  Administered 2019-05-25: 100 mg via ORAL
  Filled 2019-05-24: qty 2

## 2019-05-24 MED ORDER — DOCUSATE SODIUM 100 MG PO CAPS
100.0000 mg | ORAL_CAPSULE | Freq: Two times a day (BID) | ORAL | Status: DC
  Administered 2019-05-24 – 2019-05-25 (×2): 100 mg via ORAL
  Filled 2019-05-24 (×2): qty 1

## 2019-05-24 MED ORDER — EVOLOCUMAB 140 MG/ML ~~LOC~~ SOAJ: 140.0000 mg | SUBCUTANEOUS | Status: DC

## 2019-05-24 MED ORDER — ONDANSETRON HCL 4 MG PO TABS: 4.0000 mg | ORAL_TABLET | Freq: Four times a day (QID) | ORAL | Status: DC | PRN

## 2019-05-24 MED ORDER — BUPIVACAINE HCL (PF) 0.25 % IJ SOLN
INTRAMUSCULAR | Status: AC
  Filled 2019-05-24: qty 30

## 2019-05-24 MED ORDER — MIDAZOLAM HCL 5 MG/5ML IJ SOLN
INTRAMUSCULAR | Status: DC | PRN
  Administered 2019-05-24: 2 mg via INTRAVENOUS

## 2019-05-24 MED ORDER — METHOCARBAMOL 500 MG IVPB - SIMPLE MED
INTRAVENOUS | Status: AC
  Filled 2019-05-24: qty 50

## 2019-05-24 MED ORDER — FENTANYL CITRATE (PF) 100 MCG/2ML IJ SOLN
25.0000 ug | INTRAMUSCULAR | Status: DC | PRN
  Administered 2019-05-24: 50 ug via INTRAVENOUS

## 2019-05-24 MED ORDER — METHOCARBAMOL 500 MG IVPB - SIMPLE MED
500.0000 mg | Freq: Four times a day (QID) | INTRAVENOUS | Status: DC | PRN
  Administered 2019-05-24: 500 mg via INTRAVENOUS
  Filled 2019-05-24: qty 50

## 2019-05-24 MED ORDER — SENNA-DOCUSATE SODIUM 8.6-50 MG PO TABS: 2.0000 | ORAL_TABLET | Freq: Every day | ORAL | 1 refills | Status: DC

## 2019-05-24 MED ORDER — BUPIVACAINE IN DEXTROSE 0.75-8.25 % IT SOLN
INTRATHECAL | Status: DC | PRN
  Administered 2019-05-24: 1.8 mL via INTRATHECAL

## 2019-05-24 MED ORDER — METOCLOPRAMIDE HCL 5 MG PO TABS: 5.0000 mg | ORAL_TABLET | Freq: Three times a day (TID) | ORAL | Status: DC | PRN

## 2019-05-24 MED ORDER — METOCLOPRAMIDE HCL 5 MG/ML IJ SOLN: 5.0000 mg | Freq: Three times a day (TID) | INTRAMUSCULAR | Status: DC | PRN

## 2019-05-24 MED ORDER — LACTATED RINGERS IV SOLN
INTRAVENOUS | Status: DC
  Administered 2019-05-24 (×2): via INTRAVENOUS

## 2019-05-24 MED ORDER — METOCLOPRAMIDE HCL 5 MG/ML IJ SOLN: 10.0000 mg | Freq: Once | INTRAMUSCULAR | Status: DC | PRN

## 2019-05-24 MED ORDER — PROPOFOL 10 MG/ML IV BOLUS
INTRAVENOUS | Status: AC
  Filled 2019-05-24: qty 20

## 2019-05-24 MED ORDER — EPHEDRINE SULFATE-NACL 50-0.9 MG/10ML-% IV SOSY
PREFILLED_SYRINGE | INTRAVENOUS | Status: DC | PRN
  Administered 2019-05-24 (×3): 10 mg via INTRAVENOUS

## 2019-05-24 MED ORDER — CYCLOBENZAPRINE HCL 10 MG PO TABS: 10.0000 mg | ORAL_TABLET | Freq: Three times a day (TID) | ORAL | 0 refills | Status: DC | PRN

## 2019-05-24 MED ORDER — FENTANYL CITRATE (PF) 100 MCG/2ML IJ SOLN
INTRAMUSCULAR | Status: AC
  Filled 2019-05-24: qty 2

## 2019-05-24 MED ORDER — CEFAZOLIN SODIUM-DEXTROSE 2-4 GM/100ML-% IV SOLN
2.0000 g | Freq: Four times a day (QID) | INTRAVENOUS | Status: AC
  Administered 2019-05-24 (×2): 2 g via INTRAVENOUS
  Filled 2019-05-24 (×2): qty 100

## 2019-05-24 MED ORDER — MIDAZOLAM HCL 2 MG/2ML IJ SOLN
INTRAMUSCULAR | Status: AC
  Filled 2019-05-24: qty 2

## 2019-05-24 MED ORDER — PROPOFOL 10 MG/ML IV BOLUS
INTRAVENOUS | Status: AC
  Filled 2019-05-24: qty 60

## 2019-05-24 MED ORDER — ONDANSETRON HCL 4 MG/2ML IJ SOLN: 4.0000 mg | Freq: Four times a day (QID) | INTRAMUSCULAR | Status: DC | PRN

## 2019-05-24 MED ORDER — ROPINIROLE HCL 1 MG PO TABS
2.0000 mg | ORAL_TABLET | Freq: Every day | ORAL | Status: DC
  Administered 2019-05-25: 2 mg via ORAL
  Filled 2019-05-24: qty 2

## 2019-05-24 MED ORDER — CEFAZOLIN SODIUM-DEXTROSE 2-4 GM/100ML-% IV SOLN
2.0000 g | INTRAVENOUS | Status: AC
  Administered 2019-05-24: 2 g via INTRAVENOUS

## 2019-05-24 MED ORDER — FENOFIBRATE 54 MG PO TABS
54.0000 mg | ORAL_TABLET | Freq: Every day | ORAL | Status: DC
  Administered 2019-05-25: 54 mg via ORAL
  Filled 2019-05-24: qty 1

## 2019-05-24 MED ORDER — ONDANSETRON HCL 4 MG/2ML IJ SOLN
INTRAMUSCULAR | Status: AC
  Filled 2019-05-24: qty 2

## 2019-05-24 MED ORDER — PHENOL 1.4 % MT LIQD: 1.0000 | OROMUCOSAL | Status: DC | PRN

## 2019-05-24 MED ORDER — MORPHINE SULFATE (PF) 2 MG/ML IV SOLN: 0.5000 mg | INTRAVENOUS | Status: DC | PRN

## 2019-05-24 MED ORDER — TRAZODONE HCL 50 MG PO TABS
50.0000 mg | ORAL_TABLET | Freq: Every day | ORAL | Status: DC
  Administered 2019-05-24: 50 mg via ORAL
  Filled 2019-05-24: qty 1

## 2019-05-24 MED ORDER — POLYETHYLENE GLYCOL 3350 17 G PO PACK: 17.0000 g | PACK | Freq: Every day | ORAL | Status: DC | PRN

## 2019-05-24 MED ORDER — DIPHENHYDRAMINE HCL 12.5 MG/5ML PO ELIX: 12.5000 mg | ORAL_SOLUTION | ORAL | Status: DC | PRN

## 2019-05-24 MED ORDER — ROPINIROLE HCL 1 MG PO TABS
2.0000 mg | ORAL_TABLET | Freq: Every day | ORAL | Status: DC
  Administered 2019-05-24: 2 mg via ORAL
  Filled 2019-05-24: qty 2

## 2019-05-24 MED ORDER — ALBUTEROL SULFATE (2.5 MG/3ML) 0.083% IN NEBU: 2.5000 mg | INHALATION_SOLUTION | Freq: Four times a day (QID) | RESPIRATORY_TRACT | Status: DC | PRN

## 2019-05-24 MED ORDER — MAGNESIUM CITRATE PO SOLN: 1.0000 | Freq: Once | ORAL | Status: DC | PRN

## 2019-05-24 MED ORDER — TRANEXAMIC ACID-NACL 1000-0.7 MG/100ML-% IV SOLN
1000.0000 mg | INTRAVENOUS | Status: AC
  Administered 2019-05-24: 1000 mg via INTRAVENOUS

## 2019-05-24 MED ORDER — ACETAMINOPHEN 500 MG PO TABS
1000.0000 mg | ORAL_TABLET | Freq: Once | ORAL | Status: AC
  Administered 2019-05-24: 1000 mg via ORAL

## 2019-05-24 MED ORDER — POTASSIUM CHLORIDE IN NACL 20-0.45 MEQ/L-% IV SOLN
INTRAVENOUS | Status: DC
  Administered 2019-05-24: 16:00:00 via INTRAVENOUS
  Filled 2019-05-24 (×2): qty 1000

## 2019-05-24 MED ORDER — PHENYLEPHRINE HCL (PRESSORS) 10 MG/ML IV SOLN
INTRAVENOUS | Status: AC
  Filled 2019-05-24: qty 1

## 2019-05-24 MED ORDER — ASPIRIN EC 325 MG PO TBEC: 325.0000 mg | DELAYED_RELEASE_TABLET | Freq: Two times a day (BID) | ORAL | 0 refills | Status: DC

## 2019-05-24 MED ORDER — LACTATED RINGERS IV SOLN: INTRAVENOUS | Status: DC

## 2019-05-24 MED ORDER — POVIDONE-IODINE 10 % EX SWAB
2.0000 "application " | Freq: Once | CUTANEOUS | Status: AC
  Administered 2019-05-24: 2 via TOPICAL

## 2019-05-24 MED ORDER — ROPINIROLE HCL 1 MG PO TABS: 1.0000 mg | ORAL_TABLET | ORAL | Status: DC

## 2019-05-24 MED ORDER — DEXAMETHASONE SODIUM PHOSPHATE 10 MG/ML IJ SOLN
INTRAMUSCULAR | Status: DC | PRN
  Administered 2019-05-24: 10 mg via INTRAVENOUS

## 2019-05-24 MED ORDER — TRANEXAMIC ACID-NACL 1000-0.7 MG/100ML-% IV SOLN
INTRAVENOUS | Status: AC
  Filled 2019-05-24: qty 100

## 2019-05-24 MED ORDER — PROPOFOL 500 MG/50ML IV EMUL
INTRAVENOUS | Status: DC | PRN
  Administered 2019-05-24: 75 ug/kg/min via INTRAVENOUS

## 2019-05-24 MED ORDER — CARVEDILOL 12.5 MG PO TABS
12.5000 mg | ORAL_TABLET | Freq: Two times a day (BID) | ORAL | Status: DC
  Administered 2019-05-24 – 2019-05-25 (×2): 12.5 mg via ORAL
  Filled 2019-05-24 (×2): qty 1

## 2019-05-24 MED ORDER — FENTANYL CITRATE (PF) 100 MCG/2ML IJ SOLN
INTRAMUSCULAR | Status: DC | PRN
  Administered 2019-05-24: 100 ug via INTRAVENOUS

## 2019-05-24 MED ORDER — HYDROCODONE-ACETAMINOPHEN 10-325 MG PO TABS: 1.0000 | ORAL_TABLET | Freq: Four times a day (QID) | ORAL | 0 refills | Status: DC | PRN

## 2019-05-24 MED ORDER — METHOCARBAMOL 500 MG PO TABS: 500.0000 mg | ORAL_TABLET | Freq: Four times a day (QID) | ORAL | Status: DC | PRN

## 2019-05-24 MED ORDER — MENTHOL 3 MG MT LOZG: 1.0000 | LOZENGE | OROMUCOSAL | Status: DC | PRN

## 2019-05-24 MED ORDER — 0.9 % SODIUM CHLORIDE (POUR BTL) OPTIME
TOPICAL | Status: DC | PRN
  Administered 2019-05-24: 1000 mL

## 2019-05-24 MED ORDER — ASPIRIN EC 325 MG PO TBEC
325.0000 mg | DELAYED_RELEASE_TABLET | Freq: Two times a day (BID) | ORAL | Status: DC
  Administered 2019-05-25: 325 mg via ORAL
  Filled 2019-05-24: qty 1

## 2019-05-24 MED ORDER — ACETAMINOPHEN 500 MG PO TABS
500.0000 mg | ORAL_TABLET | Freq: Four times a day (QID) | ORAL | Status: DC
  Administered 2019-05-25 (×2): 500 mg via ORAL
  Filled 2019-05-24 (×2): qty 1

## 2019-05-24 MED ORDER — SODIUM CHLORIDE 0.9 % IV SOLN
INTRAVENOUS | Status: DC | PRN
  Administered 2019-05-24: 40 ug/min via INTRAVENOUS

## 2019-05-24 MED ORDER — KETOROLAC TROMETHAMINE 30 MG/ML IJ SOLN
INTRAMUSCULAR | Status: DC | PRN
  Administered 2019-05-24: 30 mg via INTRAVENOUS

## 2019-05-24 MED ORDER — HYDROCODONE-ACETAMINOPHEN 5-325 MG PO TABS
1.0000 | ORAL_TABLET | ORAL | Status: DC | PRN
  Administered 2019-05-24: 2 via ORAL
  Filled 2019-05-24: qty 2

## 2019-05-24 MED ORDER — ALUM & MAG HYDROXIDE-SIMETH 200-200-20 MG/5ML PO SUSP: 30.0000 mL | ORAL | Status: DC | PRN

## 2019-05-24 MED ORDER — ZOLPIDEM TARTRATE 5 MG PO TABS: 5.0000 mg | ORAL_TABLET | Freq: Every evening | ORAL | Status: DC | PRN

## 2019-05-24 MED ORDER — PROPOFOL 10 MG/ML IV BOLUS
INTRAVENOUS | Status: DC | PRN
  Administered 2019-05-24 (×5): 20 mg via INTRAVENOUS

## 2019-05-24 MED ORDER — BUPIVACAINE HCL (PF) 0.25 % IJ SOLN
INTRAMUSCULAR | Status: DC | PRN
  Administered 2019-05-24: 30 mL

## 2019-05-24 MED ORDER — ACETAMINOPHEN 500 MG PO TABS
ORAL_TABLET | ORAL | Status: AC
  Administered 2019-05-24: 08:00:00 1000 mg via ORAL
  Filled 2019-05-24: qty 2

## 2019-05-24 MED ORDER — DEXAMETHASONE SODIUM PHOSPHATE 10 MG/ML IJ SOLN
INTRAMUSCULAR | Status: AC
  Filled 2019-05-24: qty 1

## 2019-05-24 MED ORDER — ONDANSETRON HCL 4 MG/2ML IJ SOLN
INTRAMUSCULAR | Status: DC | PRN
  Administered 2019-05-24: 4 mg via INTRAVENOUS

## 2019-05-24 MED ORDER — TRANEXAMIC ACID-NACL 1000-0.7 MG/100ML-% IV SOLN
1000.0000 mg | Freq: Once | INTRAVENOUS | Status: AC
  Administered 2019-05-24: 1000 mg via INTRAVENOUS
  Filled 2019-05-24: qty 100

## 2019-05-24 MED ORDER — DIPHENHYDRAMINE HCL 25 MG PO CAPS: 25.0000 mg | ORAL_CAPSULE | Freq: Every day | ORAL | Status: DC | PRN

## 2019-05-24 MED ORDER — ACETAMINOPHEN 325 MG PO TABS: 325.0000 mg | ORAL_TABLET | Freq: Four times a day (QID) | ORAL | Status: DC | PRN

## 2019-05-24 MED ORDER — LIDOCAINE 2% (20 MG/ML) 5 ML SYRINGE
INTRAMUSCULAR | Status: AC
  Filled 2019-05-24: qty 5

## 2019-05-24 MED ORDER — KETOROLAC TROMETHAMINE 15 MG/ML IJ SOLN
7.5000 mg | Freq: Four times a day (QID) | INTRAMUSCULAR | Status: DC
  Administered 2019-05-24 – 2019-05-25 (×3): 7.5 mg via INTRAVENOUS
  Filled 2019-05-24 (×3): qty 1

## 2019-05-24 MED ORDER — STERILE WATER FOR IRRIGATION IR SOLN
Status: DC | PRN
  Administered 2019-05-24: 2000 mL

## 2019-05-24 MED ORDER — DEXAMETHASONE SODIUM PHOSPHATE 10 MG/ML IJ SOLN
10.0000 mg | Freq: Once | INTRAMUSCULAR | Status: AC
  Administered 2019-05-25: 10 mg via INTRAVENOUS
  Filled 2019-05-24: qty 1

## 2019-05-24 MED ORDER — HYDROCODONE-ACETAMINOPHEN 7.5-325 MG PO TABS: 1.0000 | ORAL_TABLET | ORAL | Status: DC | PRN

## 2019-05-24 MED ORDER — LORATADINE 10 MG PO TABS: 10.0000 mg | ORAL_TABLET | Freq: Every day | ORAL | Status: DC | PRN

## 2019-05-24 MED ORDER — CEFAZOLIN SODIUM-DEXTROSE 2-4 GM/100ML-% IV SOLN
INTRAVENOUS | Status: AC
  Filled 2019-05-24: qty 100

## 2019-05-24 SURGICAL SUPPLY — 63 items
ACETAB CUP W GRIPTION 54MM (Plate) ×1 IMPLANT
ACETAB CUP W/GRIPTION 54 (Plate) ×2 IMPLANT
BIT DRILL 2.0X128 (BIT) ×2 IMPLANT
BIT DRILL 2.0X128MM (BIT) ×1
BLADE SAW SAG 73X25 THK (BLADE) ×1
BLADE SAW SGTL 73X25 THK (BLADE) ×2 IMPLANT
BLADE SURG SZ10 CARB STEEL (BLADE) ×6 IMPLANT
CLOSURE STERI-STRIP 1/2X4 (GAUZE/BANDAGES/DRESSINGS) ×2
CLSR STERI-STRIP ANTIMIC 1/2X4 (GAUZE/BANDAGES/DRESSINGS) ×4 IMPLANT
COVER SURGICAL LIGHT HANDLE (MISCELLANEOUS) ×3 IMPLANT
COVER WAND RF STERILE (DRAPES) IMPLANT
CUP ACETAB W/GRIPTION 54 (Plate) IMPLANT
DECANTER SPIKE VIAL GLASS SM (MISCELLANEOUS) ×2 IMPLANT
DRAPE INCISE IOBAN 66X45 STRL (DRAPES) ×6 IMPLANT
DRAPE ORTHO SPLIT 77X108 STRL (DRAPES) ×6
DRAPE POUCH INSTRU U-SHP 10X18 (DRAPES) ×3 IMPLANT
DRAPE SHEET LG 3/4 BI-LAMINATE (DRAPES) ×3 IMPLANT
DRAPE SURG 17X11 SM STRL (DRAPES) ×3 IMPLANT
DRAPE SURG ORHT 6 SPLT 77X108 (DRAPES) ×2 IMPLANT
DRAPE U-SHAPE 47X51 STRL (DRAPES) ×3 IMPLANT
DRSG MEPILEX BORDER 4X12 (GAUZE/BANDAGES/DRESSINGS) ×2 IMPLANT
DRSG MEPILEX BORDER 4X8 (GAUZE/BANDAGES/DRESSINGS) ×3 IMPLANT
DURAPREP 26ML APPLICATOR (WOUND CARE) ×6 IMPLANT
ELECT BLADE TIP CTD 4 INCH (ELECTRODE) ×3 IMPLANT
ELECT REM PT RETURN 15FT ADLT (MISCELLANEOUS) ×3 IMPLANT
ELIMINATOR HOLE APEX DEPUY (Hips) ×2 IMPLANT
FACESHIELD WRAPAROUND (MASK) ×3 IMPLANT
FACESHIELD WRAPAROUND OR TEAM (MASK) IMPLANT
GLOVE BIO SURGEON STRL SZ7.5 (GLOVE) ×3 IMPLANT
GLOVE BIO SURGEON STRL SZ8 (GLOVE) ×3 IMPLANT
GLOVE BIOGEL PI IND STRL 8 (GLOVE) ×2 IMPLANT
GLOVE BIOGEL PI INDICATOR 8 (GLOVE) ×4
GOWN STRL REUS W/TWL 2XL LVL3 (GOWN DISPOSABLE) ×3 IMPLANT
GOWN STRL REUS W/TWL LRG LVL3 (GOWN DISPOSABLE) ×3 IMPLANT
HEAD M SROM 36MM PLUS 1.5 (Hips) IMPLANT
HOOD PEEL AWAY FLYTE STAYCOOL (MISCELLANEOUS) ×9 IMPLANT
KIT BASIN OR (CUSTOM PROCEDURE TRAY) ×3 IMPLANT
KIT TURNOVER KIT A (KITS) IMPLANT
LINER NEUTRAL 52X36X54 PLUS 4 (Liner) ×2 IMPLANT
MANIFOLD NEPTUNE II (INSTRUMENTS) ×3 IMPLANT
NEEDLE HYPO 22GX1.5 SAFETY (NEEDLE) ×3 IMPLANT
NS IRRIG 1000ML POUR BTL (IV SOLUTION) ×3 IMPLANT
PACK TOTAL JOINT (CUSTOM PROCEDURE TRAY) ×3 IMPLANT
PROTECTOR NERVE ULNAR (MISCELLANEOUS) ×3 IMPLANT
RETRIEVER SUT HEWSON (MISCELLANEOUS) ×3 IMPLANT
SCREW 6.5MMX25MM (Screw) ×2 IMPLANT
SROM M HEAD 36MM PLUS 1.5 (Hips) ×3 IMPLANT
STEM OFFSET DUOFIX SZ6 STD (Stem) ×2 IMPLANT
SUCTION FRAZIER HANDLE 12FR (TUBING) ×4
SUCTION TUBE FRAZIER 12FR DISP (TUBING) ×1 IMPLANT
SUT FIBERWIRE #2 38 REV NDL BL (SUTURE) ×9
SUT MNCRL AB 4-0 PS2 18 (SUTURE) IMPLANT
SUT VIC AB 0 CT1 36 (SUTURE) ×3 IMPLANT
SUT VIC AB 2-0 CT1 27 (SUTURE) ×6
SUT VIC AB 2-0 CT1 TAPERPNT 27 (SUTURE) ×2 IMPLANT
SUT VIC AB 3-0 SH 8-18 (SUTURE) ×3 IMPLANT
SUTURE FIBERWR#2 38 REV NDL BL (SUTURE) ×3 IMPLANT
SYR CONTROL 10ML LL (SYRINGE) ×3 IMPLANT
TOWEL OR 17X26 10 PK STRL BLUE (TOWEL DISPOSABLE) ×6 IMPLANT
TOWEL OR NON WOVEN STRL DISP B (DISPOSABLE) ×3 IMPLANT
TRAY FOLEY MTR SLVR 16FR STAT (SET/KITS/TRAYS/PACK) ×3 IMPLANT
WATER STERILE IRR 1000ML POUR (IV SOLUTION) ×6 IMPLANT
YANKAUER SUCT BULB TIP 10FT TU (MISCELLANEOUS) ×3 IMPLANT

## 2019-05-24 NOTE — Transfer of Care (Signed)
Immediate Anesthesia Transfer of Care Note  Patient: Lee Mclaughlin  Procedure(s) Performed: TOTAL HIP ARTHROPLASTY (Right Hip)  Patient Location: PACU  Anesthesia Type:Spinal  Level of Consciousness: drowsy  Airway & Oxygen Therapy: Patient Spontanous Breathing and Patient connected to face mask oxygen  Post-op Assessment: Report given to RN and Post -op Vital signs reviewed and stable  Post vital signs: Reviewed and stable  Last Vitals:  Vitals Value Taken Time  BP 132/102 05/24/19 1300  Temp    Pulse 77 05/24/19 1302  Resp 16 05/24/19 1302  SpO2 100 % 05/24/19 1302  Vitals shown include unvalidated device data.  Last Pain:  Vitals:   05/24/19 0809  TempSrc:   PainSc: 5       Patients Stated Pain Goal: 4 (33/29/51 8841)  Complications: No apparent anesthesia complications

## 2019-05-24 NOTE — Discharge Instructions (Signed)

## 2019-05-24 NOTE — Plan of Care (Signed)
  Problem: Education: Goal: Knowledge of the prescribed therapeutic regimen will improve Outcome: Progressing Goal: Understanding of discharge needs will improve Outcome: Progressing Goal: Individualized Educational Video(s) Outcome: Progressing   Problem: Activity: Goal: Ability to avoid complications of mobility impairment will improve Outcome: Progressing Goal: Ability to tolerate increased activity will improve Outcome: Progressing   Problem: Clinical Measurements: Goal: Postoperative complications will be avoided or minimized Outcome: Progressing   Problem: Pain Management: Goal: Pain level will decrease with appropriate interventions Outcome: Progressing   Problem: Skin Integrity: Goal: Will show signs of wound healing Outcome: Progressing   Problem: Education: Goal: Knowledge of General Education information will improve Description: Including pain rating scale, medication(s)/side effects and non-pharmacologic comfort measures Outcome: Progressing   Problem: Health Behavior/Discharge Planning: Goal: Ability to manage health-related needs will improve Outcome: Progressing   Problem: Clinical Measurements: Goal: Ability to maintain clinical measurements within normal limits will improve Outcome: Progressing Goal: Will remain free from infection Outcome: Progressing Goal: Diagnostic test results will improve Outcome: Progressing Goal: Respiratory complications will improve Outcome: Progressing Goal: Cardiovascular complication will be avoided Outcome: Progressing   Problem: Activity: Goal: Risk for activity intolerance will decrease Outcome: Progressing   Problem: Nutrition: Goal: Adequate nutrition will be maintained Outcome: Progressing   Problem: Coping: Goal: Level of anxiety will decrease Outcome: Progressing   Problem: Elimination: Goal: Will not experience complications related to bowel motility Outcome: Progressing Goal: Will not experience  complications related to urinary retention Outcome: Progressing   Problem: Pain Managment: Goal: General experience of comfort will improve Outcome: Progressing   Problem: Safety: Goal: Ability to remain free from injury will improve Outcome: Progressing   Problem: Skin Integrity: Goal: Risk for impaired skin integrity will decrease Outcome: Progressing  Plan of care discussed with patient  

## 2019-05-24 NOTE — Anesthesia Procedure Notes (Signed)
Date/Time: 05/24/2019 10:20 AM Performed by: Sharlette Dense, CRNA Oxygen Delivery Method: Simple face mask

## 2019-05-24 NOTE — Anesthesia Procedure Notes (Addendum)
Spinal  Patient location during procedure: OR Start time: 05/24/2019 10:23 AM End time: 05/24/2019 10:33 AM Staffing Anesthesiologist: Montez Hageman, MD Resident/CRNA: Sharlette Dense, CRNA Performed: anesthesiologist and resident/CRNA  Preanesthetic Checklist Completed: patient identified, site marked, surgical consent, pre-op evaluation, timeout performed, IV checked, risks and benefits discussed and monitors and equipment checked Spinal Block Patient position: sitting Prep: site prepped and draped and DuraPrep Patient monitoring: heart rate, continuous pulse ox and blood pressure Approach: right paramedian Location: L3-4 Injection technique: single-shot Needle Needle type: Pencan  Needle gauge: 24 G Needle length: 9 cm Additional Notes Kit expiration date 10/08/2019 and lot # [id number] 3rd attempt after CRNA. Clear free flow CSF, negative paresthesia Tolerated well and placed supine

## 2019-05-24 NOTE — Evaluation (Signed)
Physical Therapy Evaluation Patient Details Name: Lee Mclaughlin MRN: [id number] DOB: February 13, 1951 Today's Date: 05/24/2019   History of Present Illness  Pt is a 68 y/o male s/p R THA, posterior approach. PMH includes CAD s/p CABG, MI, and R TKA.   Clinical Impression  Pt is s/p surgery above with deficits below. Pt requiring min to min guard A for mobility tasks with RW. Educated about posterior hip precautions. Will continue to follow acutely to maximize functional mobility independence and safety.     Follow Up Recommendations Follow surgeon's recommendation for DC plan and follow-up therapies;Supervision for mobility/OOB    Equipment Recommendations  Rolling walker with 5" wheels;3in1 (PT)    Recommendations for Other Services OT consult     Precautions / Restrictions Precautions Precautions: Posterior Hip Precaution Booklet Issued: No Precaution Comments: Verbally reviewed posterior hip precautions. Restrictions Weight Bearing Restrictions: Yes RLE Weight Bearing: Weight bearing as tolerated      Mobility  Bed Mobility Overal bed mobility: Needs Assistance Bed Mobility: Supine to Sit     Supine to sit: Supervision     General bed mobility comments: Supervision for safety.   Transfers Overall transfer level: Needs assistance Equipment used: Rolling walker (2 wheeled) Transfers: Sit to/from Stand Sit to Stand: Min assist         General transfer comment: Min A for lift assist and steadying. Cues for safe hand placement. Cues to kick out RLE to maintain hip precautions.   Ambulation/Gait Ambulation/Gait assistance: Min guard Gait Distance (Feet): 100 Feet Assistive device: Rolling walker (2 wheeled) Gait Pattern/deviations: Step-to pattern;Step-through pattern;Decreased step length - right;Decreased step length - left;Decreased weight shift to right;Antalgic Gait velocity: Decreased    General Gait Details: Slow, antalgic gait. Cues for sequencing using RW.  Able to progress to step through pattern with gait progression. Cues to maintain no IR precautions when turning during gait.   Stairs            Wheelchair Mobility    Modified Rankin (Stroke Patients Only)       Balance Overall balance assessment: Needs assistance Sitting-balance support: No upper extremity supported;Feet supported Sitting balance-Leahy Scale: Good     Standing balance support: Bilateral upper extremity supported;During functional activity Standing balance-Leahy Scale: Poor Standing balance comment: Reliant on BUE support                              Pertinent Vitals/Pain Pain Assessment: Faces Faces Pain Scale: Hurts little more Pain Location: R hip  Pain Descriptors / Indicators: Aching;Operative site guarding Pain Intervention(s): Limited activity within patient's tolerance;Monitored during session;Repositioned    Home Living Family/patient expects to be discharged to:: Private residence Living Arrangements: Spouse/significant other Available Help at Discharge: Family;Available 24 hours/day Type of Home: House Home Access: Level entry     Home Layout: Two level;Able to live on main level with bedroom/bathroom Home Equipment: Crutches;Shower seat - built in      Prior Function Level of Independence: Independent with assistive device(s)         Comments: Reports occasional use of crutches with mobility secondary to pain. Otherwise was independent.      Hand Dominance        Extremity/Trunk Assessment   Upper Extremity Assessment Upper Extremity Assessment: Defer to OT evaluation    Lower Extremity Assessment Lower Extremity Assessment: RLE deficits/detail RLE Deficits / Details: Deficits consistent with post op pain and weakness.  Cervical / Trunk Assessment Cervical / Trunk Assessment: Normal  Communication   Communication: No difficulties  Cognition Arousal/Alertness: Awake/alert Behavior During Therapy:  WFL for tasks assessed/performed Overall Cognitive Status: Within Functional Limits for tasks assessed                                        General Comments      Exercises     Assessment/Plan    PT Assessment Patient needs continued PT services  PT Problem List Decreased strength;Decreased range of motion;Decreased balance;Decreased mobility;Decreased knowledge of use of DME;Decreased knowledge of precautions;Pain       PT Treatment Interventions Gait training;DME instruction;Functional mobility training;Therapeutic activities;Therapeutic exercise;Balance training;Patient/family education    PT Goals (Current goals can be found in the Care Plan section)  Acute Rehab PT Goals Patient Stated Goal: to play golf PT Goal Formulation: With patient Time For Goal Achievement: 06/07/19 Potential to Achieve Goals: Good    Frequency 7X/week   Barriers to discharge        Co-evaluation               AM-PAC PT "6 Clicks" Mobility  Outcome Measure Help needed turning from your back to your side while in a flat bed without using bedrails?: A Little Help needed moving from lying on your back to sitting on the side of a flat bed without using bedrails?: A Little Help needed moving to and from a bed to a chair (including a wheelchair)?: A Little Help needed standing up from a chair using your arms (e.g., wheelchair or bedside chair)?: A Little Help needed to walk in hospital room?: A Little Help needed climbing 3-5 steps with a railing? : A Lot 6 Click Score: 17    End of Session Equipment Utilized During Treatment: Gait belt Activity Tolerance: Patient tolerated treatment well Patient left: in chair;with call bell/phone within reach;with chair alarm set Nurse Communication: Mobility status PT Visit Diagnosis: Other abnormalities of gait and mobility (R26.89);Pain Pain - Right/Left: Right Pain - part of body: Hip    Time: [id number] PT Time Calculation  (min) (ACUTE ONLY): 21 min   Charges:   PT Evaluation $PT Eval Low Complexity: 1 Low          Gladys DammeBrittany Addisson Frate, PT, DPT  Acute Rehabilitation Services  Pager: [phone] Office: [phone]   Lehman PromBrittany S Olivier Frayre 05/24/2019, 6:14 PM

## 2019-05-24 NOTE — Anesthesia Postprocedure Evaluation (Signed)
Anesthesia Post Note  Patient: Lee Mclaughlin  Procedure(s) Performed: TOTAL HIP ARTHROPLASTY (Right Hip)     Patient location during evaluation: PACU Anesthesia Type: Spinal Level of consciousness: awake and alert Pain management: pain level controlled Vital Signs Assessment: post-procedure vital signs reviewed and stable Respiratory status: spontaneous breathing and respiratory function stable Cardiovascular status: blood pressure returned to baseline and stable Postop Assessment: no headache, no backache, spinal receding and no apparent nausea or vomiting Anesthetic complications: no    Last Vitals:  Vitals:   05/24/19 1400 05/24/19 1516  BP: (!) 134/91 125/85  Pulse: 68 78  Resp: 16 16  Temp:  (!) 36.4 C  SpO2: 100% 97%    Last Pain:  Vitals:   05/24/19 1516  TempSrc: Oral  PainSc:                  Montez Hageman

## 2019-05-24 NOTE — Op Note (Signed)
05/24/2019  12:23 PM  PATIENT:  Lee Mclaughlin   MRN: [id number]  PRE-OPERATIVE DIAGNOSIS:  Right hip primary localized osteoarthritis  POST-OPERATIVE DIAGNOSIS:  same  PROCEDURE:  Procedure(s): TOTAL HIP ARTHROPLASTY  PREOPERATIVE INDICATIONS:    Lee Mclaughlin is an 68 y.o. male who has a diagnosis of Right hip primary localized osteoarthritis and elected for surgical management after failing conservative treatment.  The risks benefits and alternatives were discussed with the patient including but not limited to the risks of nonoperative treatment, versus surgical intervention including infection, bleeding, nerve injury, periprosthetic fracture, the need for revision surgery, dislocation, leg length discrepancy, blood clots, cardiopulmonary complications, morbidity, mortality, among others, and they were willing to proceed.     OPERATIVE REPORT     SURGEON:  Marchia Bond, MD    ASSISTANT:  Joya Gaskins, OPA-C  (Present throughout the entire procedure,  necessary for completion of procedure in a timely manner, assisting with retraction, instrumentation, and closure)     ANESTHESIA: Spinal with intraoperative injection of Marcaine and Toradol  ESTIMATED BLOOD LOSS: 419 mL    COMPLICATIONS:  None.     UNIQUE ASPECTS OF THE CASE: He had extreme stiffness with difficulty both with internal and external rotation.  There was substantial hypertrophic osteophyte on the acetabulum making the native anatomy difficult to reference.  Ultimately I had the cup prepped to a 54, bringing line to line, the implant sat in nicely, I was matching the anterior wall exactly, there was a little bit of posterior osteophyte still overhanging, I was concerned about posterior stability so I used a posterior lip, which was more stable with the final implant than the trials.  Additionally, his neck was very long, my cut was approximately 1 thumbs breath, but during the trialing process even with a 5 which seated  fully, I was going to have to go to an 8.5 to restore neck length, so I potted a 6 proud, and ended up with a 1 mm head ball.  COMPONENTS:  [employer] fit femur size 6 seated proud with a 36 mm + 1.5 metallic head ball and a Gription Acetabular shell size 54, with a single cancellous screw for backup fixation, with an apex hole eliminator and a 10 degree lipped +4 polyethylene liner.    PROCEDURE IN DETAIL:   The patient was met in the holding area and  identified.  The appropriate hip was identified and marked at the operative site.  The patient was then transported to the OR  and  placed under spinal anesthesia.  At that point, the patient was  placed in the lateral decubitus position with the operative side up and  secured to the operating room table and all bony prominences padded.     The operative lower extremity was prepped from the iliac crest to the distal leg.  Sterile draping was performed.  Time out was performed prior to incision.      A routine posterolateral approach was utilized via sharp dissection  carried down to the subcutaneous tissue.  Gross bleeders were Bovie coagulated.  The iliotibial band was identified and incised along the length of the skin incision.  Self-retaining retractors were  inserted.  With the hip internally rotated, the short external rotators  were identified. The piriformis and capsule was tagged with FiberWire, and the hip capsule released in a T-type fashion.  The femoral neck was exposed, and I resected the femoral neck using the appropriate jig. This was  performed at approximately a thumb's breadth above the lesser trochanter.    I then exposed the deep acetabulum, cleared out any tissue including the ligamentum teres.  There was a substantial amount of labrum and tissue within the acetabulum had to be extracted.  A wing retractor was placed.  After adequate visualization, I excised the labrum, and then sequentially reamed.  I did ream  medially down to approximately the medial wall, there is still just a little bit of space left before I engage the wall.    I placed the trial acetabulum, which seated nicely, and then I reamed line to line in order to make sure complete seating, and then I impacted the real cup into place.  Appropriate version and inclination was confirmed clinically matching their bony anatomy, and also with the use of the jig.  I placed a cancellous screw to augment fixation.  A trial polyethylene liner was placed and the wing retractor removed.    I then prepared the proximal femur using the cookie-cutter, the lateralizing reamer, and then sequentially reamed and broached.  A trial broach, neck, and head was utilized. My first trial with the 5 broach and a 1 mm neck was fairly loose and unstable, and did not restore neck length.  I went to a size 6, and trialed with a 1 mm neck which had better length restoration, although still slight instability posteriorly with internal rotation.  Therefore I opted for a posterior lipped liner.  I remove the trial implants, placed a apex hole eliminator and a posterior lipped liner with the maximal offset posteriorly.  I then impacted the real femoral prosthesis into place into the appropriate version, and I impacted the real head ball into place.  The stem was left intentionally proud.  This restored length.  The hip was then reduced and taken through functional range of motion and found to have excellent stability. Leg lengths were restored.  I then used a 2 mm drill bits to pass the FiberWire suture from the capsule and piriformis through the greater trochanter, and secured this. Excellent posterior capsular repair was achieved. I also closed the T in the capsule.  I then irrigated the hip copiously again with pulse lavage, and repaired the fascia with Vicryl, followed by Vicryl for the subcutaneous tissue, Monocryl for the skin, Steri-Strips and sterile gauze. The wounds  were injected. The patient was then awakened and returned to PACU in stable and satisfactory condition. There were no complications.  Joshua Landau, MD Orthopedic Surgeon [phone]   05/24/2019 12:23 PM      

## 2019-05-24 NOTE — H&P (Signed)
PREOPERATIVE H&P  Chief Complaint: Right hip pain  HPI: Lee Mclaughlin is a 68 y.o. male who presents for preoperative history and physical with a diagnosis of right hip osteoarthritis. Symptoms are rated as moderate to severe, and have been worsening.  This is significantly impairing activities of daily living.  He has elected for surgical management.   He cannot take anti-inflammatories because of a history of cardiac bypass, he has declined injections, he wishes for surgical replacement.  Pain is located around the right groin.  Past Medical History:  Diagnosis Date  . Coronary artery disease   . Dyslipidemia   . H/O cardiovascular stress test 10/19/2012   MET test - good effort, peak VO2 of almost 112% predicted, HR 79% predicted, mildly ischemia at end of exercise  . History of hyperthyroidism as a child   . History of nuclear stress test 03/2011   bruce myoview; mild perfusion defect in mid anteroseptal, apical septal, apical regions (infarct/scar/breast attenuation); post-stress EF 55%; no signficant ischemia  . Ischemic cardiomyopathy    history of   . Myocardial infarction ([place])   . PONV (postoperative nausea and vomiting)   . Primary localized osteoarthritis of right knee   . S/P CABG (coronary artery bypass graft) 2000  . 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 Wears glasses    Past Surgical History:  Procedure Laterality Date  . A-FLUTTER ABLATION N/A 10/01/2018   Procedure: A-FLUTTER ABLATION;  Surgeon: Evans Lance, MD;  Location: [facility];  Service: Cardiovascular;  Laterality: N/A;  . A-FLUTTER ABLATION N/A 11/02/2018   Procedure: A-FLUTTER ABLATION;  Surgeon: Evans Lance, MD;  Location: [facility];  Service: Cardiovascular;  Laterality: N/A;  . CARDIAC CATHETERIZATION  06/01/2007   no obstructive CAD, patent grafts, EF 40%, apical hypokinesis, anterolateral hypokinesis (Dr. Gerrie Nordmann)   . CERVICAL SPINE SURGERY    .  CHOLECYSTECTOMY    . COLONOSCOPY    . CORONARY ARTERY BYPASS GRAFT  06/05/1999   LIMA to LAD, SVG to diagonal 1, SVG to PDA of Cfx, right radial graft to OM1 (Dr. Remus Loffler)   . ESOPHAGOGASTRODUODENOSCOPY    . EYE SURGERY    . KNEE SURGERY Right    pt. states x 6   . TOTAL KNEE ARTHROPLASTY Right 07/23/2015   Procedure: RIGHT TOTAL KNEE ARTHROPLASTY;  Surgeon: Elsie Saas, MD;  Location: [facility];  Service: Orthopedics;  Laterality: Right;  . TRANSTHORACIC ECHOCARDIOGRAM  02/2008   EF 40%, severe apical wall hypokinesis, mod-severe anterior wall hypokinesis; RV mildly dilated; mild mitral annular calcif; mild TR, RSVP 30-49mHg   Social History   Socioeconomic History  . Marital status: Married    Spouse name: Not on file  . Number of children: 2  . Years of education: 17 . Highest education level: Not on file  Occupational History    [employer]er: [employer]  . Financial resource strain: Not on file  . Food insecurity    Worry: Not on file    Inability: Not on file  . Transportation needs    Medical: Not on file    Non-medical: Not on file  Tobacco Use  . Smoking status: Former Smoker    Types: Cigars  . Smokeless tobacco: Never Used  . Tobacco comment: cigar occassionally  Substance and Sexual Activity  . Alcohol use: Yes    Alcohol/week: 0.0 standard drinks    Comment: socially  . Drug use:  No  . Sexual activity: Not on file  Lifestyle  . Physical activity    Days per week: Not on file    Minutes per session: Not on file  . Stress: Not on file  Relationships  . Social Herbalist on phone: Not on file    Gets together: Not on file    Attends religious service: Not on file    Active member of club or organization: Not on file    Attends meetings of clubs or organizations: Not on file    Relationship status: Not on file  Other Topics Concern  . Not on file  Social History Narrative  . Not on file   Family History  Problem Relation  Age of Onset  . CAD Father        s/p CABGx4 at 50  . Hyperlipidemia Father    Allergies  Allergen Reactions  . Morphine And Related Nausea And Vomiting  . Crestor [Rosuvastatin]     Myalgias   . Vytorin [Ezetimibe-Simvastatin]     Myalgias    Prior to Admission medications   Medication Sig Start Date End Date Taking? Authorizing Provider  aspirin 81 MG tablet Take 81 mg by mouth daily.   Yes [provider]  carvedilol (COREG) 12.5 MG tablet Take 12.5 mg by mouth 2 (two) times daily with a meal.   Yes [provider]  cyclobenzaprine (FLEXERIL) 10 MG tablet Take 10 mg by mouth 3 (three) times daily as needed for muscle spasms.   Yes [provider]  diphenhydrAMINE (BENADRYL) 25 MG tablet Take 25 mg by mouth daily as needed for allergies.   Yes [provider]  Evolocumab (REPATHA SURECLICK) 784 MG/ML SOAJ Inject 140 mg into the skin every 14 (fourteen) days. 04/05/19  Yes Hilty, Nadean Corwin, MD  fenofibrate 54 MG tablet Take 54 mg by mouth daily.   Yes [provider]  ibuprofen (ADVIL,MOTRIN) 200 MG tablet Take 400-600 mg by mouth 2 (two) times daily as needed for moderate pain.    Yes [provider]  loratadine (CLARITIN) 10 MG tablet Take 10 mg by mouth daily as needed for allergies.   Yes [provider]  losartan (COZAAR) 100 MG tablet Take 100 mg by mouth daily.   Yes [provider]  PROAIR HFA 108 (90 BASE) MCG/ACT inhaler Inhale 1-2 puffs into the lungs every 6 (six) hours as needed for wheezing or shortness of breath.  12/12/14  Yes [provider]  rOPINIRole (REQUIP) 1 MG tablet Take 1 mg by mouth See admin instructions. Take 1 mg by mouth anywhere from 4 to 6 times a day based on the severity of the restless legs, take 2 to 3 mg at night (maximum of 6 mg per 24 hours)   Yes [provider]  traMADol (ULTRAM) 50 MG tablet Take 50 mg by mouth every 6 (six) hours as needed for moderate pain.   12/22/18  Yes [provider]  traZODone (DESYREL) 50 MG tablet Take 50 mg by mouth at bedtime.  11/08/15  Yes [provider]     Positive ROS: All other systems have been reviewed and were otherwise negative with the exception of those mentioned in the HPI and as above.  Physical Exam: General: Alert, no acute distress Cardiovascular: No pedal edema Respiratory: No cyanosis, no use of accessory musculature GI: No organomegaly, abdomen is soft and non-tender Skin: No lesions in the area of chief complaint  Neurologic: Sensation intact distally Psychiatric: Patient is competent for consent with normal mood and affect Lymphatic: No axillary or cervical lymphadenopathy  MUSCULOSKELETAL: Right hip has 0 to 85 degrees of motion, with almost no internal rotation.  Positive pain in the groin.  EHL and FHL are intact.  Assessment: Right hip primary localized osteoarthritis   Plan: Plan for Procedure(s): TOTAL HIP ARTHROPLASTY  The risks benefits and alternatives were discussed with the patient including but not limited to the risks of nonoperative treatment, versus surgical intervention including infection, bleeding, nerve injury,  blood clots, cardiopulmonary complications, morbidity, mortality, among others, and they were willing to proceed.   Anticipated LOS equal to or greater than 2 midnights due to - Age 7 and older with one or more of the following:  - Obesity  - Expected need for hospital services (PT, OT, Nursing) required for safe  discharge  - Anticipated need for postoperative skilled nursing care or inpatient rehab  - Active co-morbidities: Coronary Artery Disease    Johnny Bridge, MD Cell [phone]   05/24/2019 9:33 AM

## 2019-05-25 ENCOUNTER — Encounter (HOSPITAL_COMMUNITY): Payer: Self-pay | Admitting: Orthopedic Surgery

## 2019-05-25 LAB — CBC
HCT: 42.5 % (ref 39.0–52.0)
Hemoglobin: 14 g/dL (ref 13.0–17.0)
MCH: 30.6 pg (ref 26.0–34.0)
MCHC: 32.9 g/dL (ref 30.0–36.0)
MCV: 92.8 fL (ref 80.0–100.0)
Platelets: 150 10*3/uL (ref 150–400)
RBC: 4.58 MIL/uL (ref 4.22–5.81)
RDW: 12.5 % (ref 11.5–15.5)
WBC: 12.4 10*3/uL — ABNORMAL HIGH (ref 4.0–10.5)
nRBC: 0 % (ref 0.0–0.2)

## 2019-05-25 LAB — BASIC METABOLIC PANEL
Anion gap: 12 (ref 5–15)
BUN: 33 mg/dL — ABNORMAL HIGH (ref 8–23)
CO2: 22 mmol/L (ref 22–32)
Calcium: 9.1 mg/dL (ref 8.9–10.3)
Chloride: 103 mmol/L (ref 98–111)
Creatinine, Ser: 1.12 mg/dL (ref 0.61–1.24)
GFR calc Af Amer: >60 mL/min (ref >60)
GFR calc non Af Amer: >60 mL/min (ref >60)
Glucose, Bld: 138 mg/dL — ABNORMAL HIGH (ref 70–99)
Potassium: 4.4 mmol/L (ref 3.5–5.1)
Sodium: 137 mmol/L (ref 135–145)

## 2019-05-25 NOTE — Progress Notes (Addendum)
Patient ID: Lee Mclaughlin, male   DOB: 29-Jan-1951, 68 y.o.   MRN: [id number]     Subjective:  Patient reports pain as mild.  Patient in bed and in no acute distress.  Requesting to go home  Objective:   VITALS:   Vitals:   05/24/19 2055 05/24/19 2143 05/25/19 0102 05/25/19 0524  BP:  (!) 155/90 126/75 137/88  Pulse: 84 87 74 81  Resp: 16 18 18 18   Temp:  97.6 F (36.4 C) (!) 97.4 F (36.3 C) 97.8 F (36.6 C)  TempSrc:      SpO2:  98% 97% 99%  Weight:      Height:        ABD soft Sensation intact distally Dorsiflexion/Plantar flexion intact Incision: dressing C/D/I and no drainage   Lab Results  Component Value Date   WBC 12.4 (H) 05/25/2019   HGB 14.0 05/25/2019   HCT 42.5 05/25/2019   MCV 92.8 05/25/2019   PLT 150 05/25/2019   BMET    Component Value Date/Time   NA 137 05/25/2019 0323   NA 136 10/26/2018 0927   K 4.4 05/25/2019 0323   CL 103 05/25/2019 0323   CO2 22 05/25/2019 0323   GLUCOSE 138 (H) 05/25/2019 0323   BUN 33 (H) 05/25/2019 0323   BUN 42 (H) 10/26/2018 0927   CREATININE 1.12 05/25/2019 0323   CREATININE 1.35 12/14/2014 0844   CALCIUM 9.1 05/25/2019 0323   GFRNONAA >60 05/25/2019 0323   GFRAA >60 05/25/2019 0323     Assessment/Plan: 1 Day Post-Op   Principal Problem:   Primary localized osteoarthritis of right hip   Advance diet Up with therapy WBAT Dry dressing PRN Follow up with Mardelle Matte as scheduled   Lunette Stands 05/25/2019, 7:51 AM  Seen last night, discussed and agree with above.   Marchia Bond, MD Cell [phone]

## 2019-05-25 NOTE — Evaluation (Signed)
Occupational Therapy Evaluation Patient Details Name: Lee EwingsMark A Mclaughlin MRN: [id number] DOB: [birth year] Today's Date: 05/25/2019    History of Present Illness Pt is a 68 y/o male s/p R THA, posterior approach. PMH includes CAD s/p CABG, MI, and R TKA.    Clinical Impression   Pt was admitted for the above sx. All education was completed. Pt verbalizes understanding of all.  No further OT needs    Follow Up Recommendations  Supervision/Assistance - 24 hour    Equipment Recommendations  3 in 1 bedside commode    Recommendations for Other Services       Precautions / Restrictions Precautions Precautions: Posterior Hip Precaution Booklet Issued: No Precaution Comments: Verbally reviewed posterior hip precautions. Restrictions RLE Weight Bearing: Weight bearing as tolerated      Mobility Bed Mobility Overal bed mobility: Modified Independent             General bed mobility comments: flat bed out to L side  Transfers   Equipment used: Rolling walker (2 wheeled);Crutches   Sit to Stand: Supervision              Balance                                           ADL either performed or assessed with clinical judgement   ADL Overall ADL's : Needs assistance/impaired                         Toilet Transfer: Supervision/safety;Ambulation;BSC;RW       Tub/ Shower Transfer: Walk-in shower;Supervision/safety;Ambulation     General ADL Comments: used crutches for simulated shower transfer and 3:1 over commode. Practiced without arms, if his toilet is high enough.  He will have wife put socks on and feels he can do pants with his stick.   Reviewed THPs in detail for adls and modifications needed.  Pt verbalizes understanding of all.  Based on clinical judgment, supervision to gather clothes/UB adls; mod A for LB adls     Vision         Perception     Praxis      Pertinent Vitals/Pain Pain Assessment: No/denies pain     Hand  Dominance     Extremity/Trunk Assessment Upper Extremity Assessment Upper Extremity Assessment: Overall WFL for tasks assessed           Communication Communication Communication: No difficulties   Cognition Arousal/Alertness: Awake/alert Behavior During Therapy: WFL for tasks assessed/performed Overall Cognitive Status: Within Functional Limits for tasks assessed                                     General Comments       Exercises     Shoulder Instructions      Home Living Family/patient expects to be discharged to:: Private residence Living Arrangements: Spouse/significant other                 Bathroom Shower/Tub: Producer, television/film/videoWalk-in shower   Bathroom Toilet: Handicapped height     Home Equipment: Crutches;Shower seat - built in   Additional Comments: reports shower seat is high.  Has a stick he uses to assist with dressing and long shoehorn      Prior Functioning/Environment Level of Independence: Independent with assistive  device(s)                 OT Problem List:        OT Treatment/Interventions:      OT Goals(Current goals can be found in the care plan section) Acute Rehab OT Goals Patient Stated Goal: to play golf OT Goal Formulation: All assessment and education complete, DC therapy  OT Frequency:     Barriers to D/C:            Co-evaluation              AM-PAC OT "6 Clicks" Daily Activity     Outcome Measure Help from another person eating meals?: None Help from another person taking care of personal grooming?: A Little Help from another person toileting, which includes using toliet, bedpan, or urinal?: A Little Help from another person bathing (including washing, rinsing, drying)?: A Lot Help from another person to put on and taking off regular upper body clothing?: A Little Help from another person to put on and taking off regular lower body clothing?: A Lot 6 Click Score: 17   End of Session    Activity  Tolerance: Patient tolerated treatment well Patient left: in chair;with call bell/phone within reach;with chair alarm set  OT Visit Diagnosis: Muscle weakness (generalized) (M62.81)                Time: 1914-7829 OT Time Calculation (min): 33 min Charges:  OT General Charges $OT Visit: 1 Visit OT Evaluation $OT Eval Low Complexity: 1 Low OT Treatments $Self Care/Home Management : 8-22 mins  Lesle Chris, OTR/L Acute Rehabilitation Services [phone] [facility] pager [phone] office 05/25/2019  [place] 05/25/2019, 9:32 AM

## 2019-05-25 NOTE — Plan of Care (Signed)
  Problem: Education: Goal: Understanding of discharge needs will improve Outcome: Adequate for Discharge   Problem: Skin Integrity: Goal: Will show signs of wound healing Outcome: Adequate for Discharge   Problem: Clinical Measurements: Goal: Ability to maintain clinical measurements within normal limits will improve Outcome: Adequate for Discharge Goal: Will remain free from infection Outcome: Adequate for Discharge Goal: Diagnostic test results will improve Outcome: Adequate for Discharge Goal: Respiratory complications will improve Outcome: Adequate for Discharge Goal: Cardiovascular complication will be avoided Outcome: Adequate for Discharge   Problem: Elimination: Goal: Will not experience complications related to bowel motility Outcome: Adequate for Discharge Home with wife today. Discharge teaching done, written information given.

## 2019-05-25 NOTE — Progress Notes (Signed)
Physical Therapy Treatment Patient Details Name: Lee Mclaughlin MRN: [id number] DOB: August 26, 1951 Today's Date: 05/25/2019    History of Present Illness Pt is a 68 y/o male s/p R THA, posterior approach. PMH includes CAD s/p CABG, MI, and R TKA.     PT Comments    Pt ambulated in hallway and performs well with crutches.  Pt reports being very athletic.  Pt encouraged to take it easy and slow down for a couple weeks to allow healing and for safety.  Reviewed standing and sitting exercises and demonstrated for pt.  Pt reports understanding and did not feel he needed to perform at this time.  Pt eager for d/c home today.    Follow Up Recommendations  Follow surgeon's recommendation for DC plan and follow-up therapies;Supervision for mobility/OOB     Equipment Recommendations  None recommended by PT    Recommendations for Other Services       Precautions / Restrictions Precautions Precautions: Posterior Hip Precaution Comments: Verbally reviewed posterior hip precautions. Restrictions RLE Weight Bearing: Weight bearing as tolerated    Mobility  Bed Mobility               General bed mobility comments: pt up in recliner on arrival  Transfers Overall transfer level: Needs assistance Equipment used: None Transfers: Sit to/from Stand Sit to Stand: Supervision         General transfer comment: pt performed without AD and utilizes UE for self assist  Ambulation/Gait Ambulation/Gait assistance: Supervision Gait Distance (Feet): 400 Feet Assistive device: Crutches Gait Pattern/deviations: Step-through pattern;Antalgic     General Gait Details: mildly antalgic gait, performs well with crutches, also did not use assistive device for about 20 feet however encouraged pt to use at least crutches for a couple days for healing; pt able to recall posterior hip precautions during mobility   Stairs             Wheelchair Mobility    Modified Rankin (Stroke Patients  Only)       Balance                                            Cognition Arousal/Alertness: Awake/alert Behavior During Therapy: WFL for tasks assessed/performed Overall Cognitive Status: Within Functional Limits for tasks assessed                                        Exercises      General Comments        Pertinent Vitals/Pain Pain Assessment: No/denies pain    Home Living                      Prior Function            PT Goals (current goals can now be found in the care plan section) Progress towards PT goals: Progressing toward goals    Frequency    7X/week      PT Plan Current plan remains appropriate    Co-evaluation              AM-PAC PT "6 Clicks" Mobility   Outcome Measure  Help needed turning from your back to your side while in a flat bed without using bedrails?: None Help needed moving from lying  on your back to sitting on the side of a flat bed without using bedrails?: None Help needed moving to and from a bed to a chair (including a wheelchair)?: None Help needed standing up from a chair using your arms (e.g., wheelchair or bedside chair)?: A Little Help needed to walk in hospital room?: A Little Help needed climbing 3-5 steps with a railing? : A Little 6 Click Score: 21    End of Session   Activity Tolerance: Patient tolerated treatment well Patient left: in chair;with call bell/phone within reach;with nursing/sitter in room Nurse Communication: Mobility status PT Visit Diagnosis: Other abnormalities of gait and mobility (R26.89)     Time: [id number] PT Time Calculation (min) (ACUTE ONLY): 20 min  Charges:  $Gait Training: 8-22 mins                     Zenovia JarredKati Cherine Drumgoole, PT, DPT Acute Rehabilitation Services Office: [phone] Pager: [phone]  Sarajane JewsLEMYRE,KATHrine E 05/25/2019, 1:11 PM

## 2019-05-25 NOTE — Plan of Care (Signed)
Plan of care reviewed and discussed with the patient. 

## 2019-05-25 NOTE — Discharge Summary (Signed)
Physician Discharge Summary  Patient ID: Lee Mclaughlin MRN: [id number] DOB/AGE: 04/27/1951 68 y.o.  Admit date: 05/24/2019 Discharge date: 05/25/2019  Admission Diagnoses:  Primary localized osteoarthritis of right hip  Discharge Diagnoses:  Principal Problem:   Primary localized osteoarthritis of right hip   Past Medical History:  Diagnosis Date  . Coronary artery disease   . Dyslipidemia   . H/O cardiovascular stress test 10/19/2012   MET test - good effort, peak VO2 of almost 112% predicted, HR 79% predicted, mildly ischemia at end of exercise  . History of hyperthyroidism as a child   . History of nuclear stress test 03/2011   bruce myoview; mild perfusion defect in mid anteroseptal, apical septal, apical regions (infarct/scar/breast attenuation); post-stress EF 55%; no signficant ischemia  . Ischemic cardiomyopathy    history of   . Myocardial infarction ([place])   . PONV (postoperative nausea and vomiting)   . Primary localized osteoarthritis of right hip 05/24/2019  . Primary localized osteoarthritis of right knee   . S/P CABG (coronary artery bypass graft) 2000  . 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 Wears glasses     Surgeries: Procedure(s): TOTAL HIP ARTHROPLASTY on 05/24/2019   Consultants (if any):   Discharged Condition: Improved  Hospital Course: Lee Mclaughlin is an 68 y.o. male who was admitted 05/24/2019 with a diagnosis of Primary localized osteoarthritis of right hip and went to the operating room on 05/24/2019 and underwent the above named procedures.    He was given perioperative antibiotics:  Anti-infectives (From admission, onward)   Start     Dose/Rate Route Frequency Ordered Stop   05/24/19 1630  ceFAZolin (ANCEF) IVPB 2g/100 mL premix     2 g 200 mL/hr over 30 Minutes Intravenous Every 6 hours 05/24/19 1525 05/24/19 2247   05/24/19 0800  ceFAZolin (ANCEF) IVPB 2g/100 mL premix     2 g 200 mL/hr over 30 Minutes Intravenous On call to  O.R. 05/24/19 0747 05/24/19 1045   05/24/19 0755  ceFAZolin (ANCEF) 2-4 GM/100ML-% IVPB    Note to Pharmacy: Randa Evens  : cabinet override      05/24/19 0755 05/24/19 1035    .  He was given sequential compression devices, early ambulation, and aspirin for DVT prophylaxis.  He benefited maximally from the hospital stay and there were no complications.  I did get a postoperative CAT scan because I was concerned about the version on the cup, a CAT scan to my view demonstrated the version to be acceptable, particularly given that he has a 10 degree posterior lip liner, and was stable on the table.  Recent vital signs:  Vitals:   05/25/19 0102 05/25/19 0524  BP: 126/75 137/88  Pulse: 74 81  Resp: 18 18  Temp: (!) 97.4 F (36.3 C) 97.8 F (36.6 C)  SpO2: 97% 99%    Recent laboratory studies:  Lab Results  Component Value Date   HGB 14.0 05/25/2019   HGB 18.2 (H) 05/16/2019   HGB 17.5 10/26/2018   Lab Results  Component Value Date   WBC 12.4 (H) 05/25/2019   PLT 150 05/25/2019   Lab Results  Component Value Date   INR 1.12 07/13/2015   Lab Results  Component Value Date   NA 137 05/25/2019   K 4.4 05/25/2019   CL 103 05/25/2019   CO2 22 05/25/2019   BUN 33 (H) 05/25/2019   CREATININE 1.12 05/25/2019   GLUCOSE 138 (H) 05/25/2019  Discharge Medications:   Allergies as of 05/25/2019      Reactions   Morphine And Related Nausea And Vomiting   Crestor [rosuvastatin]    Myalgias   Vytorin [ezetimibe-simvastatin]    Myalgias      Medication List    STOP taking these medications   aspirin 81 MG tablet Replaced by: aspirin EC 325 MG tablet   ibuprofen 200 MG tablet Commonly known as: ADVIL     TAKE these medications   aspirin EC 325 MG tablet Take 1 tablet (325 mg total) by mouth 2 (two) times daily. Replaces: aspirin 81 MG tablet   carvedilol 12.5 MG tablet Commonly known as: COREG Take 12.5 mg by mouth 2 (two) times daily with a meal.    cyclobenzaprine 10 MG tablet Commonly known as: FLEXERIL Take 1 tablet (10 mg total) by mouth 3 (three) times daily as needed for muscle spasms.   diphenhydrAMINE 25 MG tablet Commonly known as: BENADRYL Take 25 mg by mouth daily as needed for allergies.   Evolocumab 140 MG/ML Soaj Commonly known as: Repatha SureClick Inject 814 mg into the skin every 14 (fourteen) days.   fenofibrate 54 MG tablet Take 54 mg by mouth daily.   HYDROcodone-acetaminophen 10-325 MG tablet Commonly known as: Norco Take 1 tablet by mouth every 6 (six) hours as needed.   loratadine 10 MG tablet Commonly known as: CLARITIN Take 10 mg by mouth daily as needed for allergies.   losartan 100 MG tablet Commonly known as: COZAAR Take 100 mg by mouth daily.   ProAir HFA 108 (90 Base) MCG/ACT inhaler Generic drug: albuterol Inhale 1-2 puffs into the lungs every 6 (six) hours as needed for wheezing or shortness of breath.   rOPINIRole 1 MG tablet Commonly known as: REQUIP Take 1 mg by mouth See admin instructions. Take 1 mg by mouth anywhere from 4 to 6 times a day based on the severity of the restless legs, take 2 to 3 mg at night (maximum of 6 mg per 24 hours)   sennosides-docusate sodium 8.6-50 MG tablet Commonly known as: SENOKOT-S Take 2 tablets by mouth daily.   traMADol 50 MG tablet Commonly known as: ULTRAM Take 50 mg by mouth every 6 (six) hours as needed for moderate pain.   traZODone 50 MG tablet Commonly known as: DESYREL Take 50 mg by mouth at bedtime.       Diagnostic Studies: Dg Pelvis Portable  Result Date: 05/24/2019 CLINICAL DATA:  Status post right total hip arthroplasty. EXAM: PORTABLE PELVIS 1-2 VIEWS COMPARISON:  None. FINDINGS: The acetabular and femoral components of the right total hip prosthesis appear in excellent position in the AP projection. No fractures. Expected degree of postsurgical air in the adjacent soft tissues. IMPRESSION: Satisfactory appearance of right  hip in the AP projection after total hip arthroplasty. Electronically Signed   By: Lorriane Shire M.D.   On: 05/24/2019 16:07   Ct Hip Right Wo Contrast  Result Date: 05/24/2019 CLINICAL DATA:  Primary osteoarthritis of the right hip. Status post total hip replacement. Possible malposition. EXAM: CT OF THE RIGHT HIP WITHOUT CONTRAST TECHNIQUE: Multidetector CT imaging of the right hip was performed according to the standard protocol. Multiplanar CT image reconstructions were also generated. COMPARISON:  None. FINDINGS: Bones/Joint/Cartilage Acetabular inclination is 40 degrees on the reconstructed sagittal images. Acetabular cup version is essentially 0 degrees on axial and sagittal images. There is no evidence of loosening. The femoral component appears in excellent position. Soft tissues Small  amount of air in the soft tissues around the hip joint consistent with recent surgery. IMPRESSION: 1. Acetabular inclination is 40 degrees on the sagittal images. Acetabular cup version is close to 0 degrees on axial and sagittal images. Electronically Signed   By: Lorriane Shire M.D.   On: 05/24/2019 16:06   Dg Hip Port Unilat With Pelvis 1v Right  Result Date: 05/24/2019 CLINICAL DATA:  Status post right hip arthroplasty. EXAM: DG HIP (WITH OR WITHOUT PELVIS) 1V PORT RIGHT COMPARISON:  AP radiographs dated 05/24/2019 FINDINGS: There appears to be slight retroversion of the acetabular component of the prosthesis in the lateral projection. Femoral component appears in good position. IMPRESSION: Possible slight retroversion of the acetabular component of the right hip prosthesis. Electronically Signed   By: Lorriane Shire M.D.   On: 05/24/2019 16:08    Disposition: Discharge disposition: 01-Home or Self Care         Follow-up Information    Marchia Bond, MD. Schedule an appointment as soon as possible for a visit in 2 weeks.   Specialty: Orthopedic Surgery Contact information: [address]  [place] [address] [phone]            Signed: Johnny Bridge 05/25/2019, 8:12 AM

## 2019-05-28 DIAGNOSIS — E782 Mixed hyperlipidemia: Secondary | ICD-10-CM | POA: Diagnosis not present

## 2019-05-28 DIAGNOSIS — J301 Allergic rhinitis due to pollen: Secondary | ICD-10-CM | POA: Diagnosis not present

## 2019-05-28 DIAGNOSIS — Z96641 Presence of right artificial hip joint: Secondary | ICD-10-CM | POA: Diagnosis not present

## 2019-05-28 DIAGNOSIS — I2510 Atherosclerotic heart disease of native coronary artery without angina pectoris: Secondary | ICD-10-CM | POA: Diagnosis not present

## 2019-05-28 DIAGNOSIS — I252 Old myocardial infarction: Secondary | ICD-10-CM | POA: Diagnosis not present

## 2019-05-28 DIAGNOSIS — N529 Male erectile dysfunction, unspecified: Secondary | ICD-10-CM | POA: Diagnosis not present

## 2019-05-28 DIAGNOSIS — Z471 Aftercare following joint replacement surgery: Secondary | ICD-10-CM | POA: Diagnosis not present

## 2019-05-28 DIAGNOSIS — I255 Ischemic cardiomyopathy: Secondary | ICD-10-CM | POA: Diagnosis not present

## 2019-05-28 DIAGNOSIS — Z87891 Personal history of nicotine dependence: Secondary | ICD-10-CM | POA: Diagnosis not present

## 2019-05-28 DIAGNOSIS — I483 Typical atrial flutter: Secondary | ICD-10-CM | POA: Diagnosis not present

## 2019-06-06 DIAGNOSIS — M1611 Unilateral primary osteoarthritis, right hip: Secondary | ICD-10-CM | POA: Diagnosis not present

## 2019-06-08 ENCOUNTER — Emergency Department (HOSPITAL_COMMUNITY)
Admission: EM | Admit: 2019-06-08 | Discharge: 2019-06-08 | Disposition: A | Discharge status: Home / Self Care | Payer: PPO | Attending: Emergency Medicine | Admitting: Emergency Medicine

## 2019-06-08 ENCOUNTER — Encounter (HOSPITAL_COMMUNITY): Payer: Self-pay

## 2019-06-08 ENCOUNTER — Other Ambulatory Visit: Payer: Self-pay

## 2019-06-08 ENCOUNTER — Emergency Department (HOSPITAL_COMMUNITY): Payer: PPO

## 2019-06-08 DIAGNOSIS — Z87891 Personal history of nicotine dependence: Secondary | ICD-10-CM | POA: Diagnosis not present

## 2019-06-08 DIAGNOSIS — M25551 Pain in right hip: Secondary | ICD-10-CM | POA: Insufficient documentation

## 2019-06-08 DIAGNOSIS — I2510 Atherosclerotic heart disease of native coronary artery without angina pectoris: Secondary | ICD-10-CM | POA: Diagnosis not present

## 2019-06-08 DIAGNOSIS — Z7982 Long term (current) use of aspirin: Secondary | ICD-10-CM | POA: Diagnosis not present

## 2019-06-08 DIAGNOSIS — Z471 Aftercare following joint replacement surgery: Secondary | ICD-10-CM | POA: Diagnosis not present

## 2019-06-08 DIAGNOSIS — Z79899 Other long term (current) drug therapy: Secondary | ICD-10-CM | POA: Insufficient documentation

## 2019-06-08 DIAGNOSIS — Z96651 Presence of right artificial knee joint: Secondary | ICD-10-CM | POA: Diagnosis not present

## 2019-06-08 DIAGNOSIS — Z96641 Presence of right artificial hip joint: Secondary | ICD-10-CM | POA: Insufficient documentation

## 2019-06-08 DIAGNOSIS — Z951 Presence of aortocoronary bypass graft: Secondary | ICD-10-CM | POA: Insufficient documentation

## 2019-06-08 NOTE — ED Triage Notes (Signed)
Pt had right hip surgery 2 weeks ago. Pt states today he went to go get up and felt it pop out. Pt states he may have turned his foot the wrong way. Pt ambulatory with crutches. Pt states it felt like it went back in place.

## 2019-06-08 NOTE — ED Provider Notes (Signed)
[facility] DEPT Provider Note   CSN: [id number] Arrival date & time: 06/08/19  1017    History   Chief Complaint Chief Complaint  Patient presents with  . Hip Pain    HPI Lee Mclaughlin is a 68 y.o. male.     Lee Mclaughlin is a 68 y.o. male with history of recent hip replacement, 2 weeks ago with Dr. Mardelle Matte, reports to the emergency department with right hip pain.  He reports that he went to stand up from his porch, and doing so he thinks he turned his toe inward and then felt like the hip started to pop out but then went back in, afterwards he reports his muscle seized up and he was in severe pain and unable to stand up or bear weight on the hip.  He reports that he called Dr. Mardelle Matte, and then presented for evaluation.  He reports in the time that he has been here pain has significantly improved, now feels like he is able to walk on it.  He has not taken anything for pain prior to arrival, reports that he had no complications after surgery and has been recovering well, yesterday was able to walk 1.5 miles.  He has pain medication and muscle relaxers at home to take as needed.  He denies any numbness or weakness in the leg.  No pain in the knee or ankle.     Past Medical History:  Diagnosis Date  . Coronary artery disease   . Dyslipidemia   . H/O cardiovascular stress test 10/19/2012   MET test - good effort, peak VO2 of almost 112% predicted, HR 79% predicted, mildly ischemia at end of exercise  . History of hyperthyroidism as a child   . History of nuclear stress test 03/2011   bruce myoview; mild perfusion defect in mid anteroseptal, apical septal, apical regions (infarct/scar/breast attenuation); post-stress EF 55%; no signficant ischemia  . Ischemic cardiomyopathy    history of   . Myocardial infarction ([place])   . PONV (postoperative nausea and vomiting)   . Primary localized osteoarthritis of right hip 05/24/2019  . Primary localized  osteoarthritis of right knee   . S/P CABG (coronary artery bypass graft) 2000  . 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 Wears glasses     Patient Active Problem List   Diagnosis Date Noted  . Primary localized osteoarthritis of right hip 05/24/2019  . Mixed hyperlipidemia 03/21/2019  . Atrial flutter ([place]) 11/02/2018  . Typical atrial flutter ([place]) 09/24/2018  . Cough 03/19/2018  . Erectile dysfunction 03/19/2018  . Seasonal allergic rhinitis due to pollen 03/19/2018  . Inflamed seborrheic keratosis 10/31/2016  . Primary localized osteoarthritis of right knee 09/22/2016  . Staphylococcal arthritis of right knee ([place]) 09/22/2016  . DJD (degenerative joint disease) of knee 07/23/2015  . CAD (coronary artery disease) 11/22/2013  . S/P CABG x 4 11/22/2013  . Dyslipidemia 11/22/2013  . Cardiomyopathy, ischemic 11/22/2013  . Statin intolerance 11/22/2013    Past Surgical History:  Procedure Laterality Date  . A-FLUTTER ABLATION N/A 10/01/2018   Procedure: A-FLUTTER ABLATION;  Surgeon: Evans Lance, MD;  Location: [facility];  Service: Cardiovascular;  Laterality: N/A;  . A-FLUTTER ABLATION N/A 11/02/2018   Procedure: A-FLUTTER ABLATION;  Surgeon: Evans Lance, MD;  Location: [facility];  Service: Cardiovascular;  Laterality: N/A;  . CARDIAC CATHETERIZATION  06/01/2007   no obstructive CAD, patent grafts, EF 40%, apical hypokinesis,  anterolateral hypokinesis (Dr. Gerrie Nordmann)   . CERVICAL SPINE SURGERY    . CHOLECYSTECTOMY    . COLONOSCOPY    . CORONARY ARTERY BYPASS GRAFT  06/05/1999   LIMA to LAD, SVG to diagonal 1, SVG to PDA of Cfx, right radial graft to OM1 (Dr. Remus Loffler)   . ESOPHAGOGASTRODUODENOSCOPY    . EYE SURGERY    . KNEE SURGERY Right    pt. states x 6   . TOTAL HIP ARTHROPLASTY Right 05/24/2019   Procedure: TOTAL HIP ARTHROPLASTY;  Surgeon: Marchia Bond, MD;  Location: [facility];  Service: Orthopedics;  Laterality: Right;  . TOTAL KNEE  ARTHROPLASTY Right 07/23/2015   Procedure: RIGHT TOTAL KNEE ARTHROPLASTY;  Surgeon: Elsie Saas, MD;  Location: [facility];  Service: Orthopedics;  Laterality: Right;  . TRANSTHORACIC ECHOCARDIOGRAM  02/2008   EF 40%, severe apical wall hypokinesis, mod-severe anterior wall hypokinesis; RV mildly dilated; mild mitral annular calcif; mild TR, RSVP 30-106mHg        Home Medications    Prior to Admission medications   Medication Sig Start Date End Date Taking? Authorizing Provider  aspirin EC 325 MG tablet Take 1 tablet (325 mg total) by mouth 2 (two) times daily. 05/24/19   LMarchia Bond MD  carvedilol (COREG) 12.5 MG tablet Take 12.5 mg by mouth 2 (two) times daily with a meal.    [provider]  cyclobenzaprine (FLEXERIL) 10 MG tablet Take 1 tablet (10 mg total) by mouth 3 (three) times daily as needed for muscle spasms. 05/24/19   LMarchia Bond MD  diphenhydrAMINE (BENADRYL) 25 MG tablet Take 25 mg by mouth daily as needed for allergies.    [provider]  Evolocumab (REPATHA SURECLICK) 1621MG/ML SOAJ Inject 140 mg into the skin every 14 (fourteen) days. 04/05/19   Hilty, KNadean Corwin MD  fenofibrate 54 MG tablet Take 54 mg by mouth daily.    [provider]  HYDROcodone-acetaminophen (NORCO) 10-325 MG tablet Take 1 tablet by mouth every 6 (six) hours as needed. 05/24/19   LMarchia Bond MD  loratadine (CLARITIN) 10 MG tablet Take 10 mg by mouth daily as needed for allergies.    [provider]  losartan (COZAAR) 100 MG tablet Take 100 mg by mouth daily.    [provider]  PROAIR HFA 108 (90 BASE) MCG/ACT inhaler Inhale 1-2 puffs into the lungs every 6 (six) hours as needed for wheezing or shortness of breath.  12/12/14   [provider]  rOPINIRole (REQUIP) 1 MG tablet Take 1 mg by mouth See admin instructions. Take 1 mg by mouth anywhere from 4 to 6 times a day based on the severity of the restless legs, take 2 to 3 mg at night (maximum of 6  mg per 24 hours)    [provider]  sennosides-docusate sodium (SENOKOT-S) 8.6-50 MG tablet Take 2 tablets by mouth daily. 05/24/19   LMarchia Bond MD  traMADol (ULTRAM) 50 MG tablet Take 50 mg by mouth every 6 (six) hours as needed for moderate pain.  12/22/18   [provider]  traZODone (DESYREL) 50 MG tablet Take 50 mg by mouth at bedtime.  11/08/15   [provider]    Family History Family History  Problem Relation Age of Onset  . CAD Father        s/p CABGx4 at 743 . Hyperlipidemia Father     Social History Social History   Tobacco Use  . Smoking status: Former Smoker  Types: Cigars  . Smokeless tobacco: Never Used  . Tobacco comment: cigar occassionally  Substance Use Topics  . Alcohol use: Yes    Alcohol/week: 0.0 standard drinks    Comment: socially  . Drug use: No     Allergies   Morphine and related, Crestor [rosuvastatin], and Vytorin [ezetimibe-simvastatin]   Review of Systems Review of Systems  Constitutional: Negative for chills and fever.  Musculoskeletal: Positive for arthralgias.  Neurological: Negative for weakness and numbness.     Physical Exam Updated Vital Signs BP 133/70 (BP Location: Right Arm)   Pulse 78   Temp 98.1 F (36.7 C) (Oral)   Resp 16   Ht _0  (1.727 m)   Wt 106.6 kg   SpO2 99%   BMI 35.73 kg/m   Physical Exam Vitals signs and nursing note reviewed.  Constitutional:      General: He is not in acute distress.    Appearance: Normal appearance. He is well-developed and normal weight. He is not ill-appearing or diaphoretic.  HENT:     Head: Normocephalic and atraumatic.  Eyes:     General:        Right eye: No discharge.        Left eye: No discharge.  Pulmonary:     Effort: Pulmonary effort is normal. No respiratory distress.  Musculoskeletal:     Comments: Incision over right hip appears clean dry and intact with Steri-Strips in place there is no erythema or drainage.  Small palpable  hematoma at the end of incision.  There is some palpable muscle spasm but range of motion of the hip is now intact.  He is able to bear weight with minimal pain.  Reports that the muscles feel spasmed.  He has 2+ DP and PT pulses, sensation is intact, 5/5 strength.  Skin:    General: Skin is warm and dry.  Neurological:     Mental Status: He is alert.     Coordination: Coordination normal.  Psychiatric:        Mood and Affect: Mood normal.        Behavior: Behavior normal.      ED Treatments / Results  Labs (all labs ordered are listed, but only abnormal results are displayed) Labs Reviewed - No data to display  EKG None  Radiology Dg Hip Unilat  With Pelvis 2-3 Views Right  Result Date: 06/08/2019 CLINICAL DATA:  Pt states felt like rt hip popped out when standing up this a.m. surgery x 2 weeks ago, states feels like popped back in at this time EXAM: DG HIP (WITH OR WITHOUT PELVIS) 2-3V RIGHT COMPARISON:  Right hip radiographs and CT 05/24/2019 FINDINGS: A right total hip arthroplasty is unchanged in appearance without evidence of dislocation or periprosthetic fracture. Degenerative changes are noted at the pubic symphysis. IMPRESSION: Unchanged appearance of right total hip arthroplasty without evidence of acute osseous abnormality. Electronically Signed   By: Logan Bores M.D.   On: 06/08/2019 11:11    Procedures Procedures (including critical care time)  Medications Ordered in ED Medications - No data to display   Initial Impression / Assessment and Plan / ED Course  I have reviewed the triage vital signs and the nursing notes.  Pertinent labs & imaging results that were available during my care of the patient were reviewed by me and considered in my medical decision making (see chart for details).  Presents for right hip pain after he tried to stand up and had severe  pain felt like he almost dislocated the hip and then it seemed to pop back in.  Patient may have subluxed  the hip but x-rays today show that hip is in place and unchanged from postop films with no evidence of fracture or bony abnormalities.  He is able to bear weight and ambulate, some palpable muscle spasm noted, incision appears intact without signs of infection.  Offered patient knee immobilizer but he declines and reports he will wear this.  He will follow-up with Dr. Mardelle Matte, his orthopedic surgeon.  He has muscle relaxers and pain medication at home as needed but reports pain has significantly improved since he has been here.  At this time he is stable for discharge home, discussed return precautions and follow-up.  Patient discussed with Dr. Venora Maples, who saw patient as well and agrees with plan.   Final Clinical Impressions(s) / ED Diagnoses   Final diagnoses:  Right hip pain    ED Discharge Orders    None       Janet Berlin 06/08/19 1141    Jola Schmidt, MD 06/08/19 1526

## 2019-06-08 NOTE — Discharge Instructions (Signed)
Your x-rays look good and show no evidence of dislocation or fracture.  Take it easy for the next few days and follow-up with Dr. Mardelle Matte.  Use your home pain and muscle relaxers.

## 2019-06-08 NOTE — ED Notes (Signed)
Bed: [facility] Expected date:  Expected time:  Means of arrival:  Comments: 

## 2019-06-13 DIAGNOSIS — I2510 Atherosclerotic heart disease of native coronary artery without angina pectoris: Secondary | ICD-10-CM | POA: Diagnosis not present

## 2019-06-13 DIAGNOSIS — Z951 Presence of aortocoronary bypass graft: Secondary | ICD-10-CM | POA: Diagnosis not present

## 2019-06-13 DIAGNOSIS — I255 Ischemic cardiomyopathy: Secondary | ICD-10-CM | POA: Diagnosis not present

## 2019-06-13 DIAGNOSIS — Z7189 Other specified counseling: Secondary | ICD-10-CM | POA: Diagnosis not present

## 2019-06-13 DIAGNOSIS — Z789 Other specified health status: Secondary | ICD-10-CM | POA: Diagnosis not present

## 2019-06-13 DIAGNOSIS — E782 Mixed hyperlipidemia: Secondary | ICD-10-CM | POA: Diagnosis not present

## 2019-06-13 DIAGNOSIS — I4892 Unspecified atrial flutter: Secondary | ICD-10-CM | POA: Diagnosis not present

## 2019-06-13 LAB — LIPID PANEL
Chol/HDL Ratio: 2 ratio (ref 0.0–5.0)
Cholesterol, Total: 93 mg/dL — ABNORMAL LOW (ref 100–199)
HDL: 46 mg/dL (ref >39)
LDL Calculated: 18 mg/dL (ref 0–99)
Triglycerides: 147 mg/dL (ref 0–149)
VLDL Cholesterol Cal: 29 mg/dL (ref 5–40)

## 2019-06-14 ENCOUNTER — Telehealth: Payer: Self-pay | Admitting: Internal Medicine

## 2019-06-14 NOTE — Telephone Encounter (Signed)
LM of approval status and with lab results on name-verified VM

## 2019-06-14 NOTE — Telephone Encounter (Signed)
Message from Plan PA Case: [id number] Status: Approved Coverage Starts on: 06/14/2019 12:00:00 AM Coverage Ends on: 06/13/2020 12:00:00 AM

## 2019-06-14 NOTE — Telephone Encounter (Signed)
PA for repatha sureclick submitted via [email or web address] (Key: [id number])

## 2019-07-12 DIAGNOSIS — R200 Anesthesia of skin: Secondary | ICD-10-CM | POA: Diagnosis not present

## 2019-07-12 DIAGNOSIS — R0982 Postnasal drip: Secondary | ICD-10-CM | POA: Diagnosis not present

## 2019-07-12 DIAGNOSIS — G4700 Insomnia, unspecified: Secondary | ICD-10-CM | POA: Diagnosis not present

## 2019-07-12 DIAGNOSIS — K219 Gastro-esophageal reflux disease without esophagitis: Secondary | ICD-10-CM | POA: Diagnosis not present

## 2019-07-25 DIAGNOSIS — Z1159 Encounter for screening for other viral diseases: Secondary | ICD-10-CM | POA: Diagnosis not present

## 2019-08-03 DIAGNOSIS — K219 Gastro-esophageal reflux disease without esophagitis: Secondary | ICD-10-CM | POA: Diagnosis not present

## 2019-08-03 DIAGNOSIS — R0982 Postnasal drip: Secondary | ICD-10-CM | POA: Diagnosis not present

## 2019-08-03 DIAGNOSIS — J392 Other diseases of pharynx: Secondary | ICD-10-CM | POA: Diagnosis not present

## 2019-08-05 DIAGNOSIS — Z96641 Presence of right artificial hip joint: Secondary | ICD-10-CM | POA: Diagnosis not present

## 2019-08-09 ENCOUNTER — Telehealth: Payer: Self-pay | Admitting: Internal Medicine

## 2019-08-09 NOTE — Telephone Encounter (Signed)
Please advise. Thank you

## 2019-08-09 NOTE — Telephone Encounter (Signed)
Returned call to patient.  He was not worried about cost of Repatha, but rather the number of medications he takes per day.  Wanted to know that with his cholesterol "so good", did he need all the other cardiac pills.  Reviewed meds.  He has not had fenofibrate in several months - not sure why he did not get this refilled at some point.  Advised that since his labs look good at the moment, he could stay off it until next labs.    He referred several times to the medication that "replaced" fenofibrate, started with an "o".  I had him go get the medications, and he was referring to olmesartan 40 mg.   His chart indicates he should be on losartan 100 mg.   He states he is still taking this, in addition to the olmesartan.    I advised that he stop the losartan, and from a cardiac standpoint he should be taking carvedilol 12.5 mg bid, Repatha 140 mg q14d and olmesartan 40 mg qd.  He is to review all his home medications and if any others have been prescribed by Dr. Debara Pickett, he needs to call back to the office and review with [place].   Patient voiced understanding.

## 2019-08-09 NOTE — Telephone Encounter (Signed)
Patient wants to know if there is anything he can do to possibly reduce the cost of his Repatha. He states that the [place] is working well for him, but it costs him a lot of money. Between three doctors he takes 16 pills a day, and his medication bill gets high. If there is anything Dr. Debara Pickett could do to help him out,  He would appreciate it.   I sent the patient a new MyChart login code, and he will be trying to register for MyChart to connect with Dr. Debara Pickett as well

## 2019-08-11 DIAGNOSIS — G5603 Carpal tunnel syndrome, bilateral upper limbs: Secondary | ICD-10-CM | POA: Diagnosis not present

## 2019-08-14 ENCOUNTER — Emergency Department (HOSPITAL_COMMUNITY): Payer: PPO

## 2019-08-14 ENCOUNTER — Emergency Department (HOSPITAL_COMMUNITY)
Admission: EM | Admit: 2019-08-14 | Discharge: 2019-08-14 | Disposition: A | Discharge status: Home / Self Care | Payer: PPO | Attending: Emergency Medicine | Admitting: Emergency Medicine

## 2019-08-14 ENCOUNTER — Other Ambulatory Visit: Payer: Self-pay

## 2019-08-14 ENCOUNTER — Encounter (HOSPITAL_COMMUNITY): Payer: Self-pay | Admitting: Emergency Medicine

## 2019-08-14 DIAGNOSIS — Y998 Other external cause status: Secondary | ICD-10-CM | POA: Insufficient documentation

## 2019-08-14 DIAGNOSIS — I429 Cardiomyopathy, unspecified: Secondary | ICD-10-CM | POA: Diagnosis present

## 2019-08-14 DIAGNOSIS — I252 Old myocardial infarction: Secondary | ICD-10-CM | POA: Insufficient documentation

## 2019-08-14 DIAGNOSIS — Y9239 Other specified sports and athletic area as the place of occurrence of the external cause: Secondary | ICD-10-CM | POA: Diagnosis not present

## 2019-08-14 DIAGNOSIS — I25810 Atherosclerosis of coronary artery bypass graft(s) without angina pectoris: Secondary | ICD-10-CM | POA: Diagnosis not present

## 2019-08-14 DIAGNOSIS — Y792 Prosthetic and other implants, materials and accessory orthopedic devices associated with adverse incidents: Secondary | ICD-10-CM | POA: Diagnosis not present

## 2019-08-14 DIAGNOSIS — S79811A Other specified injuries of right hip, initial encounter: Secondary | ICD-10-CM | POA: Diagnosis present

## 2019-08-14 DIAGNOSIS — Y9353 Activity, golf: Secondary | ICD-10-CM | POA: Diagnosis not present

## 2019-08-14 DIAGNOSIS — I4892 Unspecified atrial flutter: Secondary | ICD-10-CM | POA: Diagnosis not present

## 2019-08-14 DIAGNOSIS — R52 Pain, unspecified: Secondary | ICD-10-CM | POA: Diagnosis not present

## 2019-08-14 DIAGNOSIS — S73004A Unspecified dislocation of right hip, initial encounter: Secondary | ICD-10-CM | POA: Diagnosis not present

## 2019-08-14 DIAGNOSIS — I2510 Atherosclerotic heart disease of native coronary artery without angina pectoris: Secondary | ICD-10-CM | POA: Diagnosis present

## 2019-08-14 DIAGNOSIS — M00061 Staphylococcal arthritis, right knee: Secondary | ICD-10-CM | POA: Diagnosis present

## 2019-08-14 DIAGNOSIS — T84020A Dislocation of internal right hip prosthesis, initial encounter: Secondary | ICD-10-CM | POA: Diagnosis not present

## 2019-08-14 DIAGNOSIS — Z96641 Presence of right artificial hip joint: Secondary | ICD-10-CM

## 2019-08-14 DIAGNOSIS — Z87891 Personal history of nicotine dependence: Secondary | ICD-10-CM | POA: Diagnosis not present

## 2019-08-14 DIAGNOSIS — Z951 Presence of aortocoronary bypass graft: Secondary | ICD-10-CM

## 2019-08-14 DIAGNOSIS — I255 Ischemic cardiomyopathy: Secondary | ICD-10-CM | POA: Diagnosis present

## 2019-08-14 DIAGNOSIS — Z79899 Other long term (current) drug therapy: Secondary | ICD-10-CM | POA: Diagnosis not present

## 2019-08-14 HISTORY — DX: Dislocation of internal right hip prosthesis, initial encounter: T84.020A

## 2019-08-14 LAB — BASIC METABOLIC PANEL
Anion gap: 11 (ref 5–15)
BUN: 35 mg/dL — ABNORMAL HIGH (ref 8–23)
CO2: 21 mmol/L — ABNORMAL LOW (ref 22–32)
Calcium: 9.4 mg/dL (ref 8.9–10.3)
Chloride: 107 mmol/L (ref 98–111)
Creatinine, Ser: 1.34 mg/dL — ABNORMAL HIGH (ref 0.61–1.24)
GFR calc Af Amer: >60 mL/min (ref >60)
GFR calc non Af Amer: 55 mL/min — ABNORMAL LOW (ref >60)
Glucose, Bld: 107 mg/dL — ABNORMAL HIGH (ref 70–99)
Potassium: 4.4 mmol/L (ref 3.5–5.1)
Sodium: 139 mmol/L (ref 135–145)

## 2019-08-14 LAB — CBC WITH DIFFERENTIAL/PLATELET
Abs Immature Granulocytes: 0.03 10*3/uL (ref 0.00–0.07)
Basophils Absolute: 0.1 10*3/uL (ref 0.0–0.1)
Basophils Relative: 1 %
Eosinophils Absolute: 0.6 10*3/uL — ABNORMAL HIGH (ref 0.0–0.5)
Eosinophils Relative: 8 %
HCT: 47.2 % (ref 39.0–52.0)
Hemoglobin: 15.5 g/dL (ref 13.0–17.0)
Immature Granulocytes: 0 %
Lymphocytes Relative: 36 %
Lymphs Abs: 2.5 10*3/uL (ref 0.7–4.0)
MCH: 30.9 pg (ref 26.0–34.0)
MCHC: 32.8 g/dL (ref 30.0–36.0)
MCV: 94 fL (ref 80.0–100.0)
Monocytes Absolute: 0.8 10*3/uL (ref 0.1–1.0)
Monocytes Relative: 12 %
Neutro Abs: 3 10*3/uL (ref 1.7–7.7)
Neutrophils Relative %: 43 %
Platelets: 182 10*3/uL (ref 150–400)
RBC: 5.02 MIL/uL (ref 4.22–5.81)
RDW: 12.9 % (ref 11.5–15.5)
WBC: 7 10*3/uL (ref 4.0–10.5)
nRBC: 0 % (ref 0.0–0.2)

## 2019-08-14 MED ORDER — HYDROCODONE-ACETAMINOPHEN 5-325 MG PO TABS: 1.0000 | ORAL_TABLET | Freq: Four times a day (QID) | ORAL | 0 refills | Status: DC | PRN

## 2019-08-14 MED ORDER — ONDANSETRON HCL 4 MG/2ML IJ SOLN
4.0000 mg | Freq: Once | INTRAMUSCULAR | Status: AC
  Administered 2019-08-14: 4 mg via INTRAVENOUS
  Filled 2019-08-14: qty 2

## 2019-08-14 MED ORDER — PROPOFOL 10 MG/ML IV BOLUS
200.0000 mg | Freq: Once | INTRAVENOUS | Status: DC
  Filled 2019-08-14: qty 20

## 2019-08-14 MED ORDER — PROPOFOL 1000 MG/100ML IV EMUL
INTRAVENOUS | Status: AC | PRN
  Administered 2019-08-14: 150 mg via INTRAVENOUS

## 2019-08-14 MED ORDER — HYDROMORPHONE HCL 1 MG/ML IJ SOLN
1.0000 mg | Freq: Once | INTRAMUSCULAR | Status: AC
  Administered 2019-08-14: 16:00:00 1 mg via INTRAVENOUS
  Filled 2019-08-14: qty 1

## 2019-08-14 NOTE — ED Triage Notes (Signed)
Per EMS pt from home, total Right hip 8 weeks ago, [place] and twisted it today, fell into a chair to catch himself. Shortened and rotation noted to Right leg

## 2019-08-14 NOTE — ED Provider Notes (Signed)
[facility] Provider Note   CSN: [id number] Arrival date & time: 08/14/19  1558     History   Chief Complaint Chief Complaint  Patient presents with  . Hip Pain    HPI Lee Mclaughlin is a 68 y.o. male with a past medical history of  CAD s/p CABG and right hip arthroplasty who presents to the emergency department with a right hip dislocation. Patient reports approximately 2 months ago he had his right hip replaced and has been recovering well post-operatively. Patient reports while he was golfing yesterday he felt a pop as he was bending over and twisting but was able to walk fine afterwards. Today patient was picking something up off of the floor in a twisting motion when he suddenly had right hip pain and was unable to move his leg at the hip. Patient was able to lower himself into a chair and presented to the emergency department for further evaluation. Patient reports normal sensation in his right leg and is able to move at the knee and ankle.     The history is provided by the patient.       Past Medical History:  Diagnosis Date  . Coronary artery disease   . Dyslipidemia   . H/O cardiovascular stress test 10/19/2012   MET test - good effort, peak VO2 of almost 112% predicted, HR 79% predicted, mildly ischemia at end of exercise  . History of hyperthyroidism as a child   . History of nuclear stress test 03/2011   bruce myoview; mild perfusion defect in mid anteroseptal, apical septal, apical regions (infarct/scar/breast attenuation); post-stress EF 55%; no signficant ischemia  . Ischemic cardiomyopathy    history of   . Myocardial infarction ([place])   . PONV (postoperative nausea and vomiting)   . Primary localized osteoarthritis of right hip 05/24/2019  . Primary localized osteoarthritis of right knee   . right total hip arthroplasty with dislocation of hip ([place]) 08/14/2019  . S/P CABG (coronary artery bypass graft) 2000  . 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 Wears glasses     Patient Active Problem List   Diagnosis Date Noted  . right total hip arthroplasty with dislocation of hip ([place]) 08/14/2019  . Primary localized osteoarthritis of right hip 05/24/2019  . Mixed hyperlipidemia 03/21/2019  . Atrial flutter ([place]) 11/02/2018  . Typical atrial flutter ([place]) 09/24/2018  . Cough 03/19/2018  . Erectile dysfunction 03/19/2018  . Seasonal allergic rhinitis due to pollen 03/19/2018  . Inflamed seborrheic keratosis 10/31/2016  . Primary localized osteoarthritis of right knee 09/22/2016  . Staphylococcal arthritis of right knee ([place]) 09/22/2016  . DJD (degenerative joint disease) of knee 07/23/2015  . CAD (coronary artery disease) 11/22/2013  . S/P CABG x 4 11/22/2013  . Dyslipidemia 11/22/2013  . Cardiomyopathy, ischemic 11/22/2013  . Statin intolerance 11/22/2013    Past Surgical History:  Procedure Laterality Date  . A-FLUTTER ABLATION N/A 10/01/2018   Procedure: A-FLUTTER ABLATION;  Surgeon: Evans Lance, MD;  Location: [facility];  Service: Cardiovascular;  Laterality: N/A;  . A-FLUTTER ABLATION N/A 11/02/2018   Procedure: A-FLUTTER ABLATION;  Surgeon: Evans Lance, MD;  Location: [facility];  Service: Cardiovascular;  Laterality: N/A;  . CARDIAC CATHETERIZATION  06/01/2007   no obstructive CAD, patent grafts, EF 40%, apical hypokinesis, anterolateral hypokinesis (Dr. Gerrie Nordmann)   . CERVICAL SPINE SURGERY    . CHOLECYSTECTOMY    . COLONOSCOPY    .  CORONARY ARTERY BYPASS GRAFT  06/05/1999   LIMA to LAD, SVG to diagonal 1, SVG to PDA of Cfx, right radial graft to OM1 (Dr. Remus Loffler)   . ESOPHAGOGASTRODUODENOSCOPY    . EYE SURGERY    . KNEE SURGERY Right    pt. states x 6   . TOTAL HIP ARTHROPLASTY Right 05/24/2019   Procedure: TOTAL HIP ARTHROPLASTY;  Surgeon: Marchia Bond, MD;  Location: [facility];  Service: Orthopedics;  Laterality: Right;  . TOTAL KNEE ARTHROPLASTY Right  07/23/2015   Procedure: RIGHT TOTAL KNEE ARTHROPLASTY;  Surgeon: Elsie Saas, MD;  Location: [facility];  Service: Orthopedics;  Laterality: Right;  . TRANSTHORACIC ECHOCARDIOGRAM  02/2008   EF 40%, severe apical wall hypokinesis, mod-severe anterior wall hypokinesis; RV mildly dilated; mild mitral annular calcif; mild TR, RSVP 30-43mHg        Home Medications    Prior to Admission medications   Medication Sig Start Date End Date Taking? Authorizing Provider  albuterol (PROAIR HFA) 108 (90 Base) MCG/ACT inhaler Inhale 2 puffs into the lungs every 6 (six) hours as needed for wheezing or shortness of breath.   Yes [provider]  carvedilol (COREG) 12.5 MG tablet Take 12.5 mg by mouth 2 (two) times daily with a meal.   Yes [provider]  clonazePAM (KLONOPIN) 0.5 MG tablet Take 0.5-1 mg by mouth at bedtime. 07/12/19  Yes [provider]  cyclobenzaprine (FLEXERIL) 10 MG tablet Take 1 tablet (10 mg total) by mouth 3 (three) times daily as needed for muscle spasms. Patient taking differently: Take 20 mg by mouth every morning.  05/24/19  Yes LMarchia Bond MD  Evolocumab (REPATHA SURECLICK) 1034MG/ML SOAJ Inject 140 mg into the skin every 14 (fourteen) days. 04/05/19  Yes Hilty, KNadean Corwin MD  fluticasone (FLONASE) 50 MCG/ACT nasal spray Place 2 sprays into the nose every morning. 07/12/19  Yes [provider]  loratadine (CLARITIN) 10 MG tablet Take 10 mg by mouth daily as needed for allergies.   Yes [provider]  olmesartan (BENICAR) 40 MG tablet Take 40 mg by mouth every morning.    Yes [provider]  OVER THE COUNTER MEDICATION Apply 1 application topically 2 (two) times daily as needed (arthritis pain). Over the counter arthritis cream   Yes [provider]  pantoprazole (PROTONIX) 40 MG tablet Take 40 mg by mouth every morning. 07/12/19  Yes [provider]  rOPINIRole (REQUIP) 1 MG tablet Take 2-3 mg by mouth 3 (three)  times daily as needed (restless legs (max 6 tablets per day)).    Yes [provider]  sennosides-docusate sodium (SENOKOT-S) 8.6-50 MG tablet Take 2 tablets by mouth daily. Patient taking differently: Take 2 tablets by mouth daily as needed for constipation.  05/24/19  Yes LMarchia Bond MD  HYDROcodone-acetaminophen (NORCO/VICODIN) 5-325 MG tablet Take 1 tablet by mouth every 6 (six) hours as needed for moderate pain. 08/14/19 08/13/20  Shepperson, Kirstin, PA-C    Family History Family History  Problem Relation Age of Onset  . CAD Father        s/p CABGx4 at 737 . Hyperlipidemia Father     Social History Social History   Tobacco Use  . Smoking status: Former Smoker    Types: Cigars  . Smokeless tobacco: Never Used  . Tobacco comment: cigar occassionally  Substance Use Topics  . Alcohol use: Yes    Alcohol/week: 0.0 standard drinks    Comment: socially  . Drug use:  No     Allergies   Morphine and related, Crestor [rosuvastatin], and Vytorin [ezetimibe-simvastatin]   Review of Systems Review of Systems  Constitutional: Negative for fever.  HENT: Negative for congestion and trouble swallowing.   Eyes: Negative for visual disturbance.  Respiratory: Negative for cough and shortness of breath.   Gastrointestinal: Negative for abdominal pain, constipation, diarrhea, nausea and vomiting.  Genitourinary: Negative for difficulty urinating.  Musculoskeletal: Positive for arthralgias and gait problem. Negative for back pain and neck pain.  Skin: Negative for wound.  Neurological: Negative for syncope, speech difficulty, weakness, light-headedness, numbness and headaches.  Psychiatric/Behavioral: Negative for confusion.     Physical Exam Updated Vital Signs BP 137/73   Pulse 70   Temp 97.7 F (36.5 C) (Oral)   Resp 19   Ht '5\' 8"'  (1.727 m)   Wt 106.6 kg   SpO2 97%   BMI 35.73 kg/m   Physical Exam Constitutional:      General: He is not in acute distress.     Appearance: He is obese.  HENT:     Head: Normocephalic and atraumatic.     Right Ear: External ear normal.     Left Ear: External ear normal.     Nose: Nose normal.     Mouth/Throat:     Mouth: Mucous membranes are moist.     Pharynx: Oropharynx is clear.  Eyes:     Conjunctiva/sclera: Conjunctivae normal.     Pupils: Pupils are equal, round, and reactive to light.  Neck:     Musculoskeletal: Neck supple.  Cardiovascular:     Rate and Rhythm: Normal rate and regular rhythm.     Pulses: Normal pulses.  Pulmonary:     Effort: Pulmonary effort is normal. No respiratory distress.     Breath sounds: Normal breath sounds. No stridor. No wheezing, rhonchi or rales.  Chest:     Chest wall: No tenderness.  Abdominal:     General: Bowel sounds are normal.     Palpations: Abdomen is soft.     Tenderness: There is no abdominal tenderness. There is no guarding or rebound.  Musculoskeletal:        General: Tenderness (R hip) and deformity (R hip) present.     Comments: Shortened R left, unable to ROM at R hip, able to flex and extend R knee and move R ankle and toes without difficulty  Skin:    General: Skin is warm and dry.     Comments: Multiple healed surgical scars on bilateral lower extremities  Neurological:     Mental Status: He is alert and oriented to person, place, and time.     Cranial Nerves: No cranial nerve deficit.     Sensory: No sensory deficit.     Motor: No weakness.     Coordination: Coordination normal.      ED Treatments / Results  Labs (all labs ordered are listed, but only abnormal results are displayed) Labs Reviewed  CBC WITH DIFFERENTIAL/PLATELET - Abnormal; Notable for the following components:      Result Value   Eosinophils Absolute 0.6 (*)    All other components within normal limits  BASIC METABOLIC PANEL - Abnormal; Notable for the following components:   CO2 21 (*)    Glucose, Bld 107 (*)    BUN 35 (*)    Creatinine, Ser 1.34 (*)    GFR calc  non Af Amer 55 (*)    All other components within normal limits  EKG None  Radiology Dg Hip Port Unilat W Or Wo Pelvis 1 View Right  Result Date: 08/14/2019 CLINICAL DATA:  68 year old male status post reduction of right hip dislocation. EXAM: DG HIP (WITH OR WITHOUT PELVIS) 1V PORT RIGHT COMPARISON:  Earlier radiograph dated 08/14/2011 FINDINGS: There is a total right hip arthroplasty. There has been interval reduction of the previously seen dislocated right hip arthroplasty, now in anatomic alignment. The hardware appears intact. There is no acute fracture. Degenerative changes of the left hip. The soft tissues are unremarkable. IMPRESSION: Successful interval reduction of the previously seen dislocated right hip arthroplasty. Electronically Signed   By: Anner Crete M.D.   On: 08/14/2019 17:58   Dg Hip Unilat W Or Wo Pelvis 2-3 Views Right  Result Date: 08/14/2019 CLINICAL DATA:  Right hip pain following a fall today. EXAM: DG HIP (WITH OR WITHOUT PELVIS) 2-3V RIGHT COMPARISON:  06/08/2019 FINDINGS: Interval posterior, superior dislocation of the femoral head component of the right total hip prosthesis. No fracture seen. Minimal left hip degenerative changes. Mild lower lumbar spine degenerative changes. IMPRESSION: Interval posterior, superior dislocation of the femoral head component of the right total hip prosthesis. Critical Value/emergent results were called by telephone at the time of interpretation on 08/14/2019 at 4:58 pm to Dr. Shirlyn Goltz , who verbally acknowledged these results. Electronically Signed   By: Claudie Revering M.D.   On: 08/14/2019 17:00    Procedures .Sedation  Date/Time: 08/14/2019 6:04 PM Performed by: Betsey Amen, MD Authorized by: Drenda Freeze, MD   Consent:    Consent obtained:  Written   Consent given by:  Patient Universal protocol:    Immediately prior to procedure a time out was called: yes     Patient identity confirmation method:  Arm band  and verbally with patient Indications:    Procedure performed:  Dislocation reduction   Procedure necessitating sedation performed by:  Different physician Pre-sedation assessment:    Time since last food or drink:  More than 3 hours   NPO status caution: unable to specify NPO status     ASA classification: class 3 - patient with severe systemic disease     Neck mobility: normal     Mouth opening:  3 or more finger widths   Mallampati score:  II - soft palate, uvula, fauces visible   Pre-sedation assessments completed and reviewed: airway patency, cardiovascular function, mental status, nausea/vomiting, pain level, respiratory function and temperature   Procedure details (see MAR for exact dosages):    Preoxygenation:  Nasal cannula   Sedation:  Propofol   Intra-procedure monitoring:  Blood pressure monitoring, cardiac monitor, continuous capnometry, continuous pulse oximetry and frequent vital sign checks   Intra-procedure events: none     Intra-procedure management:  Airway repositioning and supplemental oxygen   Total Provider sedation time (minutes):  5 Post-procedure details:    Attendance: Constant attendance by certified staff until patient recovered     Recovery: Patient returned to pre-procedure baseline     Post-sedation assessments completed and reviewed: airway patency, mental status, nausea/vomiting, pain level and respiratory function     Patient is stable for discharge or admission: yes     Patient tolerance:  Tolerated well, no immediate complications   (including critical care time)  Medications Ordered in ED Medications  HYDROmorphone (DILAUDID) injection 1 mg (1 mg Intravenous Given 08/14/19 1620)  ondansetron (ZOFRAN) injection 4 mg (4 mg Intravenous Given 08/14/19 1620)  propofol (DIPRIVAN) 1000 MG/100ML infusion (  Intravenous Stopped 08/14/19 1911)     Initial Impression / Assessment and Plan / ED Course  I have reviewed the triage vital signs and the nursing  notes.  Pertinent labs & imaging results that were available during my care of the patient were reviewed by me and considered in my medical decision making (see chart for details).        Concern for right hip arthroplasty dislocation. Patient is neurovascularly intact in right lower extremity. Patient given IV pain medication while in the emergency department. X-ray showed posterior, superior dislocation of the femoral head component of hip prosthesis.   Orthopedic surgery was consulted and evaluated patient in the emergency department and recommended dislocation reduction with sedation in the emergency department. Consent was obtained for procedural sedation by Dr. Darl Householder. Please see above procedure documentation for further details. Orthopedic surgery reduced hip and placed in right leg immobilizer. Orthopedic surgery arranged for close outpatient follow up. Patient neurovascularly intact post reduction. All questions answered. Patient comfortable with plan to discharge home and closely follow up with orthopedics.  Patient seen and plan discussed with Dr. Darl Householder.  Final Clinical Impressions(s) / ED Diagnoses   Final diagnoses:  Dislocation of right hip, initial encounter [facility])  History of right hip replacement    ED Discharge Orders         Ordered    HYDROcodone-acetaminophen (NORCO/VICODIN) 5-325 MG tablet  Every 6 hours PRN     08/14/19 1737    Increase activity slowly     08/14/19 1737    Diet - low sodium heart healthy     08/14/19 1737    Discharge instructions    Comments: Weight bearing as tolerated.  Knee immobilizer at all times.  Extend leg when sitting.  Do not flex hip past 90 degrees.   08/14/19 1737           Betsey Amen, MD 08/15/19 Meriam Sprague    Drenda Freeze, MD 08/17/19 1944

## 2019-08-14 NOTE — Consult Note (Signed)
Reason for Consult:right total hip dislocation Referring Physician: Dr Illa Level is an 68 y.o. male.  HPI: S/p right total hip this year.  Playing golf yesterday felt hip slip in and out.  Today squatted down reached behind something and hip popped out.  Transported via EMS  Past Medical History:  Diagnosis Date  . Coronary artery disease   . Dyslipidemia   . H/O cardiovascular stress test 10/19/2012   MET test - good effort, peak VO2 of almost 112% predicted, HR 79% predicted, mildly ischemia at end of exercise  . History of hyperthyroidism as a child   . History of nuclear stress test 03/2011   bruce myoview; mild perfusion defect in mid anteroseptal, apical septal, apical regions (infarct/scar/breast attenuation); post-stress EF 55%; no signficant ischemia  . Ischemic cardiomyopathy    history of   . Myocardial infarction ([place])   . PONV (postoperative nausea and vomiting)   . Primary localized osteoarthritis of right hip 05/24/2019  . Primary localized osteoarthritis of right knee   . right total hip arthroplasty with dislocation of hip ([place]) 08/14/2019  . S/P CABG (coronary artery bypass graft) 2000  . Staphylococcal arthritis of right knee ([place]) 2004  . Wears glasses     Past Surgical History:  Procedure Laterality Date  . A-FLUTTER ABLATION N/A 10/01/2018   Procedure: A-FLUTTER ABLATION;  Surgeon: Evans Lance, MD;  Location: [facility];  Service: Cardiovascular;  Laterality: N/A;  . A-FLUTTER ABLATION N/A 11/02/2018   Procedure: A-FLUTTER ABLATION;  Surgeon: Evans Lance, MD;  Location: [facility];  Service: Cardiovascular;  Laterality: N/A;  . CARDIAC CATHETERIZATION  06/01/2007   no obstructive CAD, patent grafts, EF 40%, apical hypokinesis, anterolateral hypokinesis (Dr. Gerrie Nordmann)   . CERVICAL SPINE SURGERY    . CHOLECYSTECTOMY    . COLONOSCOPY    . CORONARY ARTERY BYPASS GRAFT  06/05/1999   LIMA to LAD, SVG to diagonal 1, SVG to PDA of  Cfx, right radial graft to OM1 (Dr. Remus Loffler)   . ESOPHAGOGASTRODUODENOSCOPY    . EYE SURGERY    . KNEE SURGERY Right    pt. states x 6   . TOTAL HIP ARTHROPLASTY Right 05/24/2019   Procedure: TOTAL HIP ARTHROPLASTY;  Surgeon: Marchia Bond, MD;  Location: [facility];  Service: Orthopedics;  Laterality: Right;  . TOTAL KNEE ARTHROPLASTY Right 07/23/2015   Procedure: RIGHT TOTAL KNEE ARTHROPLASTY;  Surgeon: Elsie Saas, MD;  Location: [facility];  Service: Orthopedics;  Laterality: Right;  . TRANSTHORACIC ECHOCARDIOGRAM  02/2008   EF 40%, severe apical wall hypokinesis, mod-severe anterior wall hypokinesis; RV mildly dilated; mild mitral annular calcif; mild TR, RSVP 30-87mHg    Family History  Problem Relation Age of Onset  . CAD Father        s/p CABGx4 at 757 . Hyperlipidemia Father     Social History:  reports that he has quit smoking. His smoking use included cigars. He has never used smokeless tobacco. He reports current alcohol use. He reports that he does not use drugs.  Allergies:  Allergies  Allergen Reactions  . Morphine And Related Nausea And Vomiting  . Crestor [Rosuvastatin] Other (See Comments)    Myalgias   . Vytorin [Ezetimibe-Simvastatin] Other (See Comments)    Myalgias     Medications: I have reviewed the patient's current medications.  Results for orders placed or performed during the hospital encounter of 08/14/19 (from the past 48 hour(s))  CBC with Differential/Platelet     Status: Abnormal   Collection Time: 08/14/19  4:10 PM  Result Value Ref Range   WBC 7.0 4.0 - 10.5 K/uL   RBC 5.02 4.22 - 5.81 MIL/uL   Hemoglobin 15.5 13.0 - 17.0 g/dL   HCT 47.2 39.0 - 52.0 %   MCV 94.0 80.0 - 100.0 fL   MCH 30.9 26.0 - 34.0 pg   MCHC 32.8 30.0 - 36.0 g/dL   RDW 12.9 11.5 - 15.5 %   Platelets 182 150 - 400 K/uL   nRBC 0.0 0.0 - 0.2 %   Neutrophils Relative % 43 %   Neutro Abs 3.0 1.7 - 7.7 K/uL   Lymphocytes Relative 36 %   Lymphs Abs 2.5 0.7 - 4.0 K/uL    Monocytes Relative 12 %   Monocytes Absolute 0.8 0.1 - 1.0 K/uL   Eosinophils Relative 8 %   Eosinophils Absolute 0.6 (H) 0.0 - 0.5 K/uL   Basophils Relative 1 %   Basophils Absolute 0.1 0.0 - 0.1 K/uL   Immature Granulocytes 0 %   Abs Immature Granulocytes 0.03 0.00 - 0.07 K/uL    Comment: Performed at [facility] [address] [place], [place] [address]  Basic metabolic panel     Status: Abnormal   Collection Time: 08/14/19  4:10 PM  Result Value Ref Range   Sodium 139 135 - 145 mmol/L   Potassium 4.4 3.5 - 5.1 mmol/L   Chloride 107 98 - 111 mmol/L   CO2 21 (L) 22 - 32 mmol/L   Glucose, Bld 107 (H) 70 - 99 mg/dL   BUN 35 (H) 8 - 23 mg/dL   Creatinine, Ser 1.34 (H) 0.61 - 1.24 mg/dL   Calcium 9.4 8.9 - 10.3 mg/dL   GFR calc non Af Amer 55 (L) >60 mL/min   GFR calc Af Amer >60 >60 mL/min   Anion gap 11 5 - 15    Comment: Performed at [facility] [address], [place],  [address]    Dg Hip Mal[place] Johns Or Wo Pelvis 2-3 Views Right  Result Date: 08/14/2019 CLINICAL DATA:  Right hip pain following a fall today. EXAM: DG HIP (WITH OR WITHOUT PELVIS) 2-3V RIGHT COMPARISON:  06/08/2019 FINDINGS: Interval posterior, superior dislocation of the femoral head component of the right total hip prosthesis. No fracture seen. Minimal left hip degenerative changes. Mild lower lumbar spine degenerative changes. IMPRESSION: Interval posterior, superior dislocation of the femoral head component of the right total hip prosthesis. Critical Value/emergent results were called by telephone at the time of interpretation on 08/14/2019 at 4:58 pm to Dr. Shirlyn Goltz , who verbally acknowledged these results. Electronically Signed   By: Claudie Revering M.D.   On: 08/14/2019 17:00    Review of Systems  Constitutional: Negative.   HENT: Negative.   Eyes: Negative.   Respiratory: Negative.   Cardiovascular: Negative.   Gastrointestinal: Negative.   Genitourinary: Negative.   Musculoskeletal:  Positive for joint pain.  Skin: Negative.   Neurological: Negative.   Endo/Heme/Allergies: Negative.   Psychiatric/Behavioral: Negative.    Blood pressure (!) 103/91, pulse 72, temperature 97.6 F (36.4 C), temperature source Oral, resp. rate (!) 27, height _0  (1.727 m), weight 106.6 kg, SpO2 96 %. Physical Exam  Constitutional: He is oriented to person, place, and time. He appears well-developed and well-nourished.  HENT:  Head: Normocephalic and atraumatic.  Mouth/Throat: Oropharynx is clear and moist.  Eyes: Pupils are  equal, round, and reactive to light. Conjunctivae are normal.  Neck: Neck supple.  Cardiovascular: Normal rate.  Respiratory: Effort normal.  GI: Soft.  Genitourinary:    Genitourinary Comments: Not pertinent to current symptomatology therefore not examined.   Musculoskeletal:     Comments: Right hip flexed and shortened.  2+ DP pulse.  DNVI    Neurological: He is alert and oriented to person, place, and time.  Skin: Skin is warm and dry.  Psychiatric: He has a normal mood and affect. His behavior is normal.    Assessment/Plan: Principal Problem:   right total hip arthroplasty with dislocation of hip ([facility]) Active Problems:   CAD (coronary artery disease)   S/P CABG x 4   Cardiomyopathy, ischemic   Staphylococcal arthritis of right knee ([facility])   Atrial flutter ([facility])  Conscious sedation.  Closed reduction per Dr Bertell Maria 08/14/2019, 5:04 PM

## 2019-08-17 DIAGNOSIS — G5603 Carpal tunnel syndrome, bilateral upper limbs: Secondary | ICD-10-CM | POA: Diagnosis not present

## 2019-08-17 DIAGNOSIS — M24451 Recurrent dislocation, right hip: Secondary | ICD-10-CM | POA: Diagnosis not present

## 2019-08-31 DIAGNOSIS — S73004D Unspecified dislocation of right hip, subsequent encounter: Secondary | ICD-10-CM | POA: Diagnosis not present

## 2019-08-31 DIAGNOSIS — G5603 Carpal tunnel syndrome, bilateral upper limbs: Secondary | ICD-10-CM | POA: Diagnosis not present

## 2019-09-15 DIAGNOSIS — G5602 Carpal tunnel syndrome, left upper limb: Secondary | ICD-10-CM | POA: Diagnosis not present

## 2019-09-28 DIAGNOSIS — G5602 Carpal tunnel syndrome, left upper limb: Secondary | ICD-10-CM | POA: Diagnosis not present

## 2019-09-29 DIAGNOSIS — G5601 Carpal tunnel syndrome, right upper limb: Secondary | ICD-10-CM | POA: Diagnosis not present

## 2019-10-14 DIAGNOSIS — G5601 Carpal tunnel syndrome, right upper limb: Secondary | ICD-10-CM | POA: Diagnosis not present

## 2019-10-27 DIAGNOSIS — G5601 Carpal tunnel syndrome, right upper limb: Secondary | ICD-10-CM | POA: Diagnosis not present

## 2019-11-23 DIAGNOSIS — R519 Headache, unspecified: Secondary | ICD-10-CM | POA: Diagnosis not present

## 2019-11-23 DIAGNOSIS — U071 COVID-19: Secondary | ICD-10-CM | POA: Diagnosis not present

## 2019-11-23 DIAGNOSIS — R5383 Other fatigue: Secondary | ICD-10-CM | POA: Diagnosis not present

## 2019-11-23 DIAGNOSIS — R05 Cough: Secondary | ICD-10-CM | POA: Diagnosis not present

## 2019-11-23 DIAGNOSIS — R0981 Nasal congestion: Secondary | ICD-10-CM | POA: Diagnosis not present

## 2019-11-23 DIAGNOSIS — Z20828 Contact with and (suspected) exposure to other viral communicable diseases: Secondary | ICD-10-CM | POA: Diagnosis not present

## 2019-11-23 DIAGNOSIS — R0602 Shortness of breath: Secondary | ICD-10-CM | POA: Diagnosis not present

## 2019-12-07 DIAGNOSIS — T466X5A Adverse effect of antihyperlipidemic and antiarteriosclerotic drugs, initial encounter: Secondary | ICD-10-CM | POA: Insufficient documentation

## 2019-12-07 DIAGNOSIS — G720 Drug-induced myopathy: Secondary | ICD-10-CM | POA: Insufficient documentation

## 2019-12-29 ENCOUNTER — Ambulatory Visit: Payer: PPO | Attending: Internal Medicine

## 2019-12-29 DIAGNOSIS — Z23 Encounter for immunization: Secondary | ICD-10-CM

## 2019-12-29 NOTE — Progress Notes (Signed)
   Covid-19 Vaccination Clinic  Name:  Lee [facility]    MRN: [id number] DOB: 1951/05/27  12/29/2019  [name] was observed post Covid-19 immunization for 15 minutes without incidence. He was provided with Vaccine Information Sheet and instruction to access the V-Safe system.   Mr. Both was instructed to call 911 with any severe reactions post vaccine: Marland Kitchen Difficulty breathing  . Swelling of your face and throat  . A fast heartbeat  . A bad rash all over your body  . Dizziness and weakness    Immunizations Administered    Name Date Dose VIS Date Route   Pfizer COVID-19 Vaccine 12/29/2019  3:50 PM 0.3 mL 11/18/2019 Intramuscular   Manufacturer: [employer], [employer]   Lot: [id number]   NDC: [address]-8

## 2020-01-19 ENCOUNTER — Ambulatory Visit: Payer: PPO | Attending: Internal Medicine

## 2020-01-19 DIAGNOSIS — Z23 Encounter for immunization: Secondary | ICD-10-CM | POA: Insufficient documentation

## 2020-02-03 DIAGNOSIS — Z20822 Contact with and (suspected) exposure to covid-19: Secondary | ICD-10-CM | POA: Diagnosis not present

## 2020-02-03 DIAGNOSIS — J22 Unspecified acute lower respiratory infection: Secondary | ICD-10-CM | POA: Diagnosis not present

## 2020-02-03 DIAGNOSIS — H65192 Other acute nonsuppurative otitis media, left ear: Secondary | ICD-10-CM | POA: Diagnosis not present

## 2020-02-15 DIAGNOSIS — S73004D Unspecified dislocation of right hip, subsequent encounter: Secondary | ICD-10-CM | POA: Diagnosis not present

## 2020-02-15 DIAGNOSIS — M6281 Muscle weakness (generalized): Secondary | ICD-10-CM | POA: Diagnosis not present

## 2020-02-15 DIAGNOSIS — Z471 Aftercare following joint replacement surgery: Secondary | ICD-10-CM | POA: Diagnosis not present

## 2020-02-15 DIAGNOSIS — M25551 Pain in right hip: Secondary | ICD-10-CM | POA: Diagnosis not present

## 2020-02-18 ENCOUNTER — Other Ambulatory Visit: Payer: Self-pay

## 2020-02-18 ENCOUNTER — Emergency Department (HOSPITAL_COMMUNITY): Payer: PPO

## 2020-02-18 ENCOUNTER — Emergency Department (HOSPITAL_COMMUNITY)
Admission: EM | Admit: 2020-02-18 | Discharge: 2020-02-18 | Disposition: A | Discharge status: Home / Self Care | Payer: PPO | Attending: Emergency Medicine | Admitting: Emergency Medicine

## 2020-02-18 DIAGNOSIS — Z951 Presence of aortocoronary bypass graft: Secondary | ICD-10-CM | POA: Diagnosis not present

## 2020-02-18 DIAGNOSIS — Y929 Unspecified place or not applicable: Secondary | ICD-10-CM | POA: Diagnosis not present

## 2020-02-18 DIAGNOSIS — T84029A Dislocation of unspecified internal joint prosthesis, initial encounter: Secondary | ICD-10-CM

## 2020-02-18 DIAGNOSIS — R52 Pain, unspecified: Secondary | ICD-10-CM | POA: Diagnosis not present

## 2020-02-18 DIAGNOSIS — S79911A Unspecified injury of right hip, initial encounter: Secondary | ICD-10-CM | POA: Diagnosis present

## 2020-02-18 DIAGNOSIS — I252 Old myocardial infarction: Secondary | ICD-10-CM | POA: Diagnosis not present

## 2020-02-18 DIAGNOSIS — I2510 Atherosclerotic heart disease of native coronary artery without angina pectoris: Secondary | ICD-10-CM | POA: Diagnosis not present

## 2020-02-18 DIAGNOSIS — X500XXA Overexertion from strenuous movement or load, initial encounter: Secondary | ICD-10-CM | POA: Diagnosis not present

## 2020-02-18 DIAGNOSIS — Z87891 Personal history of nicotine dependence: Secondary | ICD-10-CM | POA: Diagnosis not present

## 2020-02-18 DIAGNOSIS — Y999 Unspecified external cause status: Secondary | ICD-10-CM | POA: Insufficient documentation

## 2020-02-18 DIAGNOSIS — T84020D Dislocation of internal right hip prosthesis, subsequent encounter: Secondary | ICD-10-CM | POA: Diagnosis not present

## 2020-02-18 DIAGNOSIS — T84020A Dislocation of internal right hip prosthesis, initial encounter: Secondary | ICD-10-CM | POA: Insufficient documentation

## 2020-02-18 DIAGNOSIS — Y93B9 Activity, other involving muscle strengthening exercises: Secondary | ICD-10-CM | POA: Insufficient documentation

## 2020-02-18 MED ORDER — PROPOFOL 10 MG/ML IV BOLUS
INTRAVENOUS | Status: AC | PRN
  Administered 2020-02-18 (×2): 40 mg via INTRAVENOUS

## 2020-02-18 MED ORDER — SODIUM CHLORIDE 0.9 % IV SOLN
INTRAVENOUS | Status: AC | PRN
  Administered 2020-02-18: 1000 mL via INTRAVENOUS

## 2020-02-18 MED ORDER — PROPOFOL 10 MG/ML IV BOLUS
1.0000 mg/kg | Freq: Once | INTRAVENOUS | Status: DC
  Filled 2020-02-18: qty 20

## 2020-02-18 MED ORDER — FENTANYL CITRATE (PF) 100 MCG/2ML IJ SOLN
100.0000 ug | Freq: Once | INTRAMUSCULAR | Status: AC
  Administered 2020-02-18: 20:00:00 100 ug via INTRAVENOUS
  Filled 2020-02-18: qty 2

## 2020-02-18 MED ORDER — SODIUM CHLORIDE 0.9 % IV BOLUS
1000.0000 mL | Freq: Once | INTRAVENOUS | Status: AC
  Administered 2020-02-18: 20:00:00 1000 mL via INTRAVENOUS

## 2020-02-18 NOTE — ED Provider Notes (Signed)
[facility] Emergency Department Provider Note MRN:  [id number]  Arrival date & time: 02/18/20     Chief Complaint   Hip Injury   History of Present Illness   Lee Mclaughlin is a 68 y.o. year-old male with a history of CAD presenting to the ED with chief complaint of hip injury.  Patient explains that he was doing his rehab exercises as directed, bent over to adjust tissue, felt sudden pain in his right hip similar to prior episodes of hip dislocation.  Unable to move the hip without significant pain.  Denies any other injuries.  No chest pain, shortness of breath, no fever recently.  Review of Systems  A complete 10 system review of systems was obtained and all systems are negative except as noted in the HPI and PMH.   Patient's Health History    Past Medical History:  Diagnosis Date  . Coronary artery disease   . Dyslipidemia   . H/O cardiovascular stress test 10/19/2012   MET test - good effort, peak VO2 of almost 112% predicted, HR 79% predicted, mildly ischemia at end of exercise  . History of hyperthyroidism as a child   . History of nuclear stress test 03/2011   bruce myoview; mild perfusion defect in mid anteroseptal, apical septal, apical regions (infarct/scar/breast attenuation); post-stress EF 55%; no signficant ischemia  . Ischemic cardiomyopathy    history of   . Myocardial infarction ([place])   . PONV (postoperative nausea and vomiting)   . Primary localized osteoarthritis of right hip 05/24/2019  . Primary localized osteoarthritis of right knee   . right total hip arthroplasty with dislocation of hip ([place]) 08/14/2019  . S/P CABG (coronary artery bypass graft) 2000  . 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 Wears glasses     Past Surgical History:  Procedure Laterality Date  . A-FLUTTER ABLATION N/A 10/01/2018   Procedure: A-FLUTTER ABLATION;  Surgeon: Evans Lance, MD;  Location: [facility];  Service: Cardiovascular;   Laterality: N/A;  . A-FLUTTER ABLATION N/A 11/02/2018   Procedure: A-FLUTTER ABLATION;  Surgeon: Evans Lance, MD;  Location: [place] [facility] [facility];  Service: Cardiovascular;  Laterality: N/A;  . CARDIAC CATHETERIZATION  06/01/2007   no obstructive CAD, patent grafts, EF 40%, apical hypokinesis, anterolateral hypokinesis (Dr. Gerrie Nordmann)   . CERVICAL SPINE SURGERY    . CHOLECYSTECTOMY    . COLONOSCOPY    . CORONARY ARTERY BYPASS GRAFT  06/05/1999   LIMA to LAD, SVG to diagonal 1, SVG to PDA of Cfx, right radial graft to OM1 (Dr. Remus Loffler)   . ESOPHAGOGASTRODUODENOSCOPY    . EYE SURGERY    . KNEE SURGERY Right    pt. states x 6   . TOTAL HIP ARTHROPLASTY Right 05/24/2019   Procedure: TOTAL HIP ARTHROPLASTY;  Surgeon: Marchia Bond, MD;  Location: [facility];  Service: Orthopedics;  Laterality: Right;  . TOTAL KNEE ARTHROPLASTY Right 07/23/2015   Procedure: RIGHT TOTAL KNEE ARTHROPLASTY;  Surgeon: Elsie Saas, MD;  Location: [facility];  Service: Orthopedics;  Laterality: Right;  . TRANSTHORACIC ECHOCARDIOGRAM  02/2008   EF 40%, severe apical wall hypokinesis, mod-severe anterior wall hypokinesis; RV mildly dilated; mild mitral annular calcif; mild TR, RSVP 30-67mHg    Family History  Problem Relation Age of Onset  . CAD Father        s/p CABGx4 at 721 . Hyperlipidemia Father     Social History   Socioeconomic History  .  Marital status: Married    Spouse name: Not on file  . Number of children: 2  . Years of education: 15  . Highest education level: Not on file  Occupational History    Employer: [employer]  Tobacco Use  . Smoking status: Former Smoker    Types: Cigars  . Smokeless tobacco: Never Used  . Tobacco comment: cigar occassionally  Substance and Sexual Activity  . Alcohol use: Yes    Alcohol/week: 0.0 standard drinks    Comment: socially  . Drug use: No  . Sexual activity: Not on file  Other Topics Concern  . Not on file  Social History Narrative  . Not on file    Social Determinants of Health   Financial Resource Strain:   . Difficulty of Paying Living Expenses:   Food Insecurity:   . Worried About Charity fundraiser in the Last Year:   . Arboriculturist in the Last Year:   Transportation Needs:   . Film/video editor (Medical):   Marland Kitchen Lack of Transportation (Non-Medical):   Physical Activity:   . Days of Exercise per Week:   . Minutes of Exercise per Session:   Stress:   . Feeling of Stress :   Social Connections:   . Frequency of Communication with Friends and Family:   . Frequency of Social Gatherings with Friends and Family:   . Attends Religious Services:   . Active Member of Clubs or Organizations:   . Attends Archivist Meetings:   Marland Kitchen Marital Status:   Intimate Partner Violence:   . Fear of Current or Ex-Partner:   . Emotionally Abused:   Marland Kitchen Physically Abused:   . Sexually Abused:      Physical Exam   Vitals:   02/18/20 2115 02/18/20 2130  BP: (!) 117/98 120/69  Pulse: 72 (!) 55  Resp: 17 14  Temp:    SpO2: 99% 99%    CONSTITUTIONAL: Well-appearing, NAD NEURO:  Alert and oriented x 3, no focal deficits EYES:  eyes equal and reactive ENT/NECK:  no LAD, no JVD CARDIO: Regular rate, well-perfused, normal S1 and S2 PULM:  CTAB no wheezing or rhonchi GI/GU:  normal bowel sounds, non-distended, non-tender MSK/SPINE: Right lower extremity is internally rotated, minimally shortened SKIN:  no rash, atraumatic PSYCH:  Appropriate speech and behavior  *Additional and/or pertinent findings included in MDM below  Diagnostic and Interventional Summary    EKG Interpretation  Date/Time:    Ventricular Rate:    PR Interval:    QRS Duration:   QT Interval:    QTC Calculation:   R Axis:     Text Interpretation:        Labs Reviewed - No data to display  DG Hip Port Unilat W or Wo Pelvis 1 View Right  Final Result    DG Pelvis Portable  Final Result      Medications  propofol (DIPRIVAN) 10 mg/mL  bolus/IV push 104.3 mg (has no administration in time range)  sodium chloride 0.9 % bolus 1,000 mL (0 mLs Intravenous Stopped 02/18/20 2109)  fentaNYL (SUBLIMAZE) injection 100 mcg (100 mcg Intravenous Given 02/18/20 2014)  propofol (DIPRIVAN) 10 mg/mL bolus/IV push (40 mg Intravenous Given 02/18/20 2026)  0.9 %  sodium chloride infusion ( Intravenous Stopped 02/18/20 2109)     Procedures  /  Critical Care .Sedation  Date/Time: 02/18/2020 9:34 PM Performed by: Maudie Flakes, MD Authorized by: Maudie Flakes, MD   Consent:  Consent obtained:  Verbal   Consent given by:  Patient   Risks discussed:  Allergic reaction, dysrhythmia, inadequate sedation, nausea, prolonged hypoxia resulting in organ damage, prolonged sedation necessitating reversal, respiratory compromise necessitating ventilatory assistance and intubation and vomiting   Alternatives discussed:  Analgesia without sedation, anxiolysis and regional anesthesia Universal protocol:    Procedure explained and questions answered to patient or proxy's satisfaction: yes     Relevant documents present and verified: yes     Test results available and properly labeled: yes     Imaging studies available: yes     Required blood products, implants, devices, and special equipment available: yes     Site/side marked: yes     Immediately prior to procedure a time out was called: yes     Patient identity confirmation method:  Verbally with patient, arm band and hospital-assigned identification number Indications:    Procedure performed:  Dislocation reduction   Procedure necessitating sedation performed by:  Physician performing sedation Pre-sedation assessment:    Time since last food or drink:  4 hours   ASA classification: class 1 - normal, healthy patient     Neck mobility: normal     Mouth opening:  3 or more finger widths   Thyromental distance:  4 finger widths   Mallampati score:  I - soft palate, uvula, fauces, pillars visible    Pre-sedation assessments completed and reviewed: airway patency, cardiovascular function, hydration status, mental status, nausea/vomiting, pain level, respiratory function and temperature   Immediate pre-procedure details:    Reassessment: Patient reassessed immediately prior to procedure     Reviewed: vital signs, relevant labs/tests and NPO status     Verified: bag valve mask available, emergency equipment available, intubation equipment available, IV patency confirmed, oxygen available and suction available   Procedure details (see MAR for exact dosages):    Preoxygenation:  Nasal cannula   Sedation:  Propofol   Intended level of sedation: deep   Intra-procedure monitoring:  Blood pressure monitoring, cardiac monitor, continuous pulse oximetry, frequent LOC assessments, frequent vital sign checks and continuous capnometry   Intra-procedure events: none     Intra-procedure management:  Airway repositioning, BVM ventilation and fluid bolus (Less than 1 minute of apnea requiring BVM ventilation, transient hypotension in the 02O systolic responding well to fluids)   Total Provider sedation time (minutes):  25 Post-procedure details:    Attendance: Constant attendance by certified staff until patient recovered     Recovery: Patient returned to pre-procedure baseline     Post-sedation assessments completed and reviewed: airway patency, cardiovascular function, hydration status, mental status, nausea/vomiting, pain level, respiratory function and temperature     Patient is stable for discharge or admission: yes     Patient tolerance:  Tolerated well, no immediate complications Comments:     Total of 120 mg propofol given in 40 mg aliquots Reduction of dislocation  Date/Time: 02/18/2020 9:35 PM Performed by: Maudie Flakes, MD Authorized by: Maudie Flakes, MD  Consent: Verbal consent obtained. Written consent obtained. Risks and benefits: risks, benefits and alternatives were discussed  Consent given by: patient Patient understanding: patient states understanding of the procedure being performed Patient consent: the patient's understanding of the procedure matches consent given Procedure consent: procedure consent matches procedure scheduled Relevant documents: relevant documents present and verified Test results: test results available and properly labeled Imaging studies: imaging studies available Patient identity confirmed: verbally with patient, arm band, hospital-assigned identification number and provided demographic data Time out:  Immediately prior to procedure a "time out" was called to verify the correct patient, procedure, equipment, support staff and site/side marked as required.  Sedation: Patient sedated: yes  Patient tolerance: patient tolerated the procedure well with no immediate complications Comments: Successful reduction of right prosthetic hip using constant upward traction.     ED Course and Medical Decision Making  I have reviewed the triage vital signs, the nursing notes, and pertinent available records from the EMR.  Pertinent labs & imaging results that were available during my care of the patient were reviewed by me and considered in my medical decision making (see below for details).     X-ray confirms mild dislocation/subluxation but will need reduction.  9:36 PM update: Hip successfully reduced as described above.  Neurovascularly intact preceding and after the reduction with strong DP pulse, normal sensation and strength.  Placed in the immobilizer.  Patient has follow-up already scheduled this week with orthopedics, message sent to his orthopedic surgeon to make them aware.  Barth Kirks. Sedonia Small, [place] [email or web address]  Final Clinical Impressions(s) / ED Diagnoses     ICD-10-CM   1. Dislocation of hip joint prosthesis, initial encounter [facility])  H59.301S    F79.909     ED  Discharge Orders    None       Discharge Instructions Discussed with and Provided to Patient:     Discharge Instructions     You were evaluated in the Emergency Department and after careful evaluation, we did not find any emergent condition requiring admission or further testing in the hospital.  We were able to successfully reduce your hip dislocation here in the emergency department.  As discussed, please follow-up with your orthopedic specialist for further management.  Use crutches and keep the knee immobilizer on until then.  Please return to the Emergency Department if you experience any worsening of your condition.  We encourage you to follow up with a primary care provider.  Thank you for allowing [place] to be a part of your care.        Maudie Flakes, MD 02/18/20 2139

## 2020-02-18 NOTE — ED Notes (Signed)
Discharge instructions reviewed with pt. Pt verbalized understanding.   

## 2020-02-18 NOTE — Discharge Instructions (Addendum)
You were evaluated in the Emergency Department and after careful evaluation, we did not find any emergent condition requiring admission or further testing in the hospital.  We were able to successfully reduce your hip dislocation here in the emergency department.  As discussed, please follow-up with your orthopedic specialist for further management.  Use crutches and keep the knee immobilizer on until then.  Please return to the Emergency Department if you experience any worsening of your condition.  We encourage you to follow up with a primary care provider.  Thank you for allowing [place] to be a part of your care.

## 2020-02-18 NOTE — ED Notes (Signed)
Suction and Ambu-bag set up and at bedside.

## 2020-02-18 NOTE — ED Notes (Signed)
Pt signed consent for right hip reduction and conscious sedation.

## 2020-02-18 NOTE — ED Triage Notes (Signed)
Pt arrives via EMS from home with chief complaint of right hip injury. Pt states he was doing his physical therapy exercises when he felt his right hip pop and he caught himself while falling forward. Pt states he has a right hip replacement and it has dislocated on 4 other occassions. EMS reports good pedal pulses. Pt received 200 mcg of Fentanyl by EMS.

## 2020-02-18 NOTE — Sedation Documentation (Signed)
XRAY Called for portable Hip XRAY

## 2020-02-19 NOTE — Progress Notes (Signed)
Orthopedic Tech Progress Note Patient Details:  Lee Mclaughlin Oct 08, 1951 [id number]  Ortho Devices Type of Ortho Device: Knee Immobilizer Ortho Device/Splint Location: rle. applied post reduction Ortho Device/Splint Interventions: Ordered, Application, Adjustment   Post Interventions Patient Tolerated: Well Instructions Provided: Care of device, Adjustment of device   Trinna Post 02/19/2020, 6:46 AM

## 2020-02-22 DIAGNOSIS — M25551 Pain in right hip: Secondary | ICD-10-CM | POA: Diagnosis not present

## 2020-02-22 DIAGNOSIS — T84020A Dislocation of internal right hip prosthesis, initial encounter: Secondary | ICD-10-CM | POA: Diagnosis not present

## 2020-02-23 ENCOUNTER — Ambulatory Visit (INDEPENDENT_AMBULATORY_CARE_PROVIDER_SITE_OTHER): Payer: PPO | Admitting: Cardiology

## 2020-02-23 ENCOUNTER — Other Ambulatory Visit: Payer: Self-pay

## 2020-02-23 ENCOUNTER — Encounter: Payer: Self-pay | Admitting: Cardiology

## 2020-02-23 ENCOUNTER — Telehealth: Payer: Self-pay | Admitting: *Deleted

## 2020-02-23 VITALS — BP 126/78 | HR 78 | Temp 97.2°F | Ht 68.0 in | Wt 236.4 lb

## 2020-02-23 DIAGNOSIS — Z951 Presence of aortocoronary bypass graft: Secondary | ICD-10-CM

## 2020-02-23 DIAGNOSIS — I483 Typical atrial flutter: Secondary | ICD-10-CM

## 2020-02-23 DIAGNOSIS — I429 Cardiomyopathy, unspecified: Secondary | ICD-10-CM | POA: Diagnosis not present

## 2020-02-23 DIAGNOSIS — Z01818 Encounter for other preprocedural examination: Secondary | ICD-10-CM | POA: Diagnosis not present

## 2020-02-23 DIAGNOSIS — E785 Hyperlipidemia, unspecified: Secondary | ICD-10-CM | POA: Diagnosis not present

## 2020-02-23 NOTE — Telephone Encounter (Signed)
Pt made aware is needing an appt for pre op clearance. Pt is not happy that he is needing to come in for appt. I explained to the pt that the last ov was 03/2019. Pt is agreeable to pre op clearance appt today with Corine Shelter, PAC @ 1:45. P thanked me for the call. I will send note to PA for appt.

## 2020-02-23 NOTE — Assessment & Plan Note (Signed)
Needs clearance prior to Rt hip revision (previous surgery 05/24/2019)

## 2020-02-23 NOTE — Telephone Encounter (Signed)
   Primary Cardiologist:Kenneth C Hilty, MD  Chart reviewed as part of pre-operative protocol coverage. Because of Lee Mclaughlin's past medical history and time since last visit, he/she will require a follow-up visit in order to better assess preoperative cardiovascular risk.  Pre-op covering staff: - Please schedule appointment and call patient to inform them. - Please contact requesting surgeon's office via preferred method (i.e, phone, fax) to inform them of need for appointment prior to surgery.  If applicable, this message will also be routed to pharmacy pool and/or primary cardiologist for input on holding anticoagulant/antiplatelet agent as requested below so that this information is available at time of patient's appointment.   [place], [place]  02/23/2020, 12:32 PM

## 2020-02-23 NOTE — Assessment & Plan Note (Signed)
June 2000-LIMAto LAD, SVG to D1, SVG to PDA from the circumflex (left dominant) and right free radial graft to OM1- Myoview low risk 2016-no recent angina

## 2020-02-23 NOTE — Telephone Encounter (Signed)
   [facility] Pre-operative Risk Assessment    Request for surgical clearance:  1. What type of surgery is being performed? Right total hip arthroplasty revision   2. When is this surgery scheduled? 03/15/20   3. What type of clearance is required (medical clearance vs. Pharmacy clearance to hold med vs. Both)? medical  4. Are there any medications that need to be held prior to surgery and how long? none  5. Practice name and name of physician performing surgery? [facility]  6. What is your office phone number [phone]   7.   What is your office fax number [phone]  8.   Anesthesia type (None, local, MAC, general) ? spinal   Fredia Beets 02/23/2020, 11:55 AM  _________________________________________________________________   (provider comments below)

## 2020-02-23 NOTE — Patient Instructions (Addendum)
Medication Instructions:  Your physician recommends that you continue on your current medications as directed. Please refer to the Current Medication list given to you today.  *If you need a refill on your cardiac medications before your next appointment, please call your pharmacy*  Testing: Your physician has requested that you have a limited echocardiogram (ASAP---before April). Echocardiography is a painless test that uses sound waves to create images of your heart. It provides your doctor with information about the size and shape of your heart and how well your heart's chambers and valves are working. This procedure takes approximately one hour. There are no restrictions for this procedure.   Follow-Up: At [facility], you and your health needs are our priority.  As part of our continuing mission to provide you with exceptional heart care, we have created designated Provider Care Teams.  These Care Teams include your primary Cardiologist (physician) and Advanced Practice Providers (APPs -  Physician Assistants and Nurse Practitioners) who all work together to provide you with the care you need, when you need it.  We recommend signing up for the patient portal called "MyChart".  Sign up information is provided on this After Visit Summary.  MyChart is used to connect with patients for Virtual Visits (Telemedicine).  Patients are able to view lab/test results, encounter notes, upcoming appointments, etc.  Non-urgent messages can be sent to your provider as well.   To learn more about what you can do with MyChart, go to [email or web address].    Your next appointment:   12 months  The format for your next appointment:   In Person  Provider:  Dr. Rennis Golden  --Please call our office 2 months in advance to schedule your follow-up appointment.

## 2020-02-23 NOTE — Progress Notes (Signed)
Cardiology Office Note:    Date:  02/23/2020   ID:  Lee Mclaughlin, DOB 03/02/1951, MRN [id number]  PCP:  [name], MD  Cardiologist:  Pixie Casino, MD  Electrophysiologist:  None   Referring MD: [name], MD   No chief complaint on file. Pre op clearance  History of Present Illness:    Lee Mclaughlin is a 70 y.o. male with a hx of history of coronary disease.  He had CABG x4 in 2000.  Myoview was low risk in 2016.  His ejection fraction in 2016 with 55%.  In October 2019 he developed atrial flutter.  Echo showed his ejection fraction to be 25%, we suspected this was secondary to his arrhythmia.  He underwent successful radiofrequency ablation by Dr. Lovena Le in October 2019 with revision November 2019.    He underwent right hip replacement in June 2020.  He is contacted now to come into the office for preop clearance prior to revision of his right hip replacement.  Since he was last seen by Dr. Debara Pickett in April 2020 he has done well.  He denies any chest pain is not use nitroglycerin.  He denies any orthopnea or lower extremity edema.  He is not had palpitations or tachycardia since his ablation.  Past Medical History:  Diagnosis Date  . Coronary artery disease   . Dyslipidemia   . H/O cardiovascular stress test 10/19/2012   MET test - good effort, peak VO2 of almost 112% predicted, HR 79% predicted, mildly ischemia at end of exercise  . History of hyperthyroidism as a child   . History of nuclear stress test 03/2011   bruce myoview; mild perfusion defect in mid anteroseptal, apical septal, apical regions (infarct/scar/breast attenuation); post-stress EF 55%; no signficant ischemia  . Ischemic cardiomyopathy    history of   . Myocardial infarction ([place])   . PONV (postoperative nausea and vomiting)   . Primary localized osteoarthritis of right hip 05/24/2019  . Primary localized osteoarthritis of right knee   . right total hip arthroplasty with dislocation of hip ([place])  08/14/2019  . S/P CABG (coronary artery bypass graft) 2000  . 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 Wears glasses     Past Surgical History:  Procedure Laterality Date  . A-FLUTTER ABLATION N/A 10/01/2018   Procedure: A-FLUTTER ABLATION;  Surgeon: Evans Lance, MD;  Location: [facility];  Service: Cardiovascular;  Laterality: N/A;  . A-FLUTTER ABLATION N/A 11/02/2018   Procedure: A-FLUTTER ABLATION;  Surgeon: Evans Lance, MD;  Location: [facility];  Service: Cardiovascular;  Laterality: N/A;  . CARDIAC CATHETERIZATION  06/01/2007   no obstructive CAD, patent grafts, EF 40%, apical hypokinesis, anterolateral hypokinesis (Dr. Gerrie Nordmann)   . CERVICAL SPINE SURGERY    . CHOLECYSTECTOMY    . COLONOSCOPY    . CORONARY ARTERY BYPASS GRAFT  06/05/1999   LIMA to LAD, SVG to diagonal 1, SVG to PDA of Cfx, right radial graft to OM1 (Dr. Remus Loffler)   . ESOPHAGOGASTRODUODENOSCOPY    . EYE SURGERY    . KNEE SURGERY Right    pt. states x 6   . TOTAL HIP ARTHROPLASTY Right 05/24/2019   Procedure: TOTAL HIP ARTHROPLASTY;  Surgeon: Marchia Bond, MD;  Location: [facility];  Service: Orthopedics;  Laterality: Right;  . TOTAL KNEE ARTHROPLASTY Right 07/23/2015   Procedure: RIGHT TOTAL KNEE ARTHROPLASTY;  Surgeon: Elsie Saas, MD;  Location: [facility];  Service:  Orthopedics;  Laterality: Right;  . TRANSTHORACIC ECHOCARDIOGRAM  02/2008   EF 40%, severe apical wall hypokinesis, mod-severe anterior wall hypokinesis; RV mildly dilated; mild mitral annular calcif; mild TR, RSVP 30-40mmHg    Current Medications: Current Meds  Medication Sig  . albuterol (PROAIR HFA) 108 (90 Base) MCG/ACT inhaler Inhale 2 puffs into the lungs every 6 (six) hours as needed for wheezing or shortness of breath.  . benzonatate (TESSALON) 200 MG capsule Take 200 mg by mouth in the morning, at noon, and at bedtime.  . carvedilol (COREG) 12.5 MG tablet Take 12.5 mg by mouth 2 (two) times daily with a meal.   . Evolocumab (REPATHA SURECLICK) 140 MG/ML SOAJ Inject 140 mg into the skin every 14 (fourteen) days.  . loratadine (CLARITIN) 10 MG tablet Take 10 mg by mouth daily as needed for allergies.  . olmesartan (BENICAR) 40 MG tablet Take 40 mg by mouth every morning.   . rOPINIRole (REQUIP) 1 MG tablet Take 2-3 mg by mouth 3 (three) times daily as needed (restless legs (max 6 tablets per day)).   . [DISCONTINUED] cyclobenzaprine (FLEXERIL) 10 MG tablet Take 1 tablet (10 mg total) by mouth 3 (three) times daily as needed for muscle spasms. (Patient taking differently: Take 20 mg by mouth every morning. )  . [DISCONTINUED] HYDROcodone-acetaminophen (NORCO/VICODIN) 5-325 MG tablet Take 1 tablet by mouth every 6 (six) hours as needed for moderate pain.  . [DISCONTINUED] pantoprazole (PROTONIX) 40 MG tablet Take 40 mg by mouth every morning.  . [DISCONTINUED] sennosides-docusate sodium (SENOKOT-S) 8.6-50 MG tablet Take 2 tablets by mouth daily.     Allergies:   Morphine and related, Crestor [rosuvastatin], and Vytorin [ezetimibe-simvastatin]   Social History   Socioeconomic History  . Marital status: Married    Spouse name: Not on file  . Number of children: 2  . Years of education: 12  . Highest education level: Not on file  Occupational History    Employer: [employer]  Tobacco Use  . Smoking status: Former Smoker    Types: Cigars  . Smokeless tobacco: Never Used  . Tobacco comment: cigar occassionally  Substance and Sexual Activity  . Alcohol use: Yes    Alcohol/week: 0.0 standard drinks    Comment: socially  . Drug use: No  . Sexual activity: Not on file  Other Topics Concern  . Not on file  Social History Narrative  . Not on file   Social Determinants of Health   Financial Resource Strain:   . Difficulty of Paying Living Expenses:   Food Insecurity:   . Worried About Running Out of Food in the Last Year:   . Ran Out of Food in the Last Year:   Transportation Needs:    . Lack of Transportation (Medical):   . Lack of Transportation (Non-Medical):   Physical Activity:   . Days of Exercise per Week:   . Minutes of Exercise per Session:   Stress:   . Feeling of Stress :   Social Connections:   . Frequency of Communication with Friends and Family:   . Frequency of Social Gatherings with Friends and Family:   . Attends Religious Services:   . Active Member of Clubs or Organizations:   . Attends Club or Organization Meetings:   . Marital Status:      Family History: The patient's family history includes CAD in his father; Hyperlipidemia in his father.  ROS:   Please see the history of present illness.     Pt tells me he had COVID "twice" He says a prior sleep study showed restless leg syndrome, no OSA   All other systems reviewed and are negative.  EKGs/Labs/Other Studies Reviewed:    The following studies were reviewed today: Echo Oct 2019  EKG:  EKG is ordered today.  The ekg ordered today demonstrates NSR, HR 74, 1st AVB  Recent Labs: 08/14/2019: BUN 35; Creatinine, Ser 1.34; Hemoglobin 15.5; Platelets 182; Potassium 4.4; Sodium 139  Recent Lipid Panel    Component Value Date/Time   CHOL 93 (L) 06/13/2019 0827   CHOL 240 (H) 12/14/2014 0844   TRIG 147 06/13/2019 0827   TRIG 194 (H) 12/14/2014 0844   HDL 46 06/13/2019 0827   HDL 46 12/14/2014 0844   CHOLHDL 2.0 06/13/2019 0827   LDLCALC 18 06/13/2019 0827   LDLCALC 155 (H) 12/14/2014 0844    Physical Exam:    VS:  BP 126/78   Pulse 78   Temp (!) 97.2 F (36.2 C)   Ht 5' 8" (1.727 m)   Wt 236 lb 6.4 oz (107.2 kg)   SpO2 98%   BMI 35.94 kg/m     Wt Readings from Last 3 Encounters:  02/23/20 236 lb 6.4 oz (107.2 kg)  02/18/20 230 lb (104.3 kg)  08/14/19 235 lb (106.6 kg)     GEN:  Well nourished, well developed in no acute distress HEENT: Normal NECK: No JVD; No carotid bruits CARDIAC: RRR, no murmurs, rubs, gallops RESPIRATORY:  Clear to auscultation without rales,  wheezing or rhonchi  ABDOMEN: Soft, non-tender, non-distended MUSCULOSKELETAL:  No edema; No deformity  SKIN: Warm and dry NEUROLOGIC:  Alert and oriented x 3 PSYCHIATRIC:  Normal affect   ASSESSMENT:    Pre op clearance- Needs clearance prior to Rt hip revision (previous surgery 05/24/2019)  CABG x 4 2000- June 2000-LIMAto LAD, SVG to D1, SVG to PDA from the circumflex (left dominant) and right free radial graft to OM1- Myoview low risk 2016-no recent angina  A-flutter- RFA Oct 2019- November 2019 NSR today- he is not on anticoagulation  Cardiomyopathy- EF 25% by echo when he was in AF Oct 2019.  He has not had symptoms of CHF.  Will check limited echo to document improvement in LVF.   Dyslipidemia- Statin intolerant- f/u with Dr Hilty who has had extensive discussions with the patient about alternatives.    PLAN:    Reviewed with Dr Hilty who saw the patient with me-the patient is an acceptable risk for the proposed procedure without further cardiac work up though we would like to check limited echo to document improvement in his LVF.  We will try and get this done pre op but will not hold up his surgery for this.    Medication Adjustments/Labs and Tests Ordered: Current medicines are reviewed at length with the patient today.  Concerns regarding medicines are outlined above.  Orders Placed This Encounter  Procedures  . EKG 12-Lead  . ECHOCARDIOGRAM LIMITED   No orders of the defined types were placed in this encounter.   Patient Instructions  Medication Instructions:  Your physician recommends that you continue on your current medications as directed. Please refer to the Current Medication list given to you today.  *If you need a refill on your cardiac medications before your next appointment, please call your pharmacy*  Testing: Your physician has requested that you have a limited echocardiogram (ASAP---before April). Echocardiography is a painless test that uses  sound waves to create images   of your heart. It provides your doctor with information about the size and shape of your heart and how well your heart's chambers and valves are working. This procedure takes approximately one hour. There are no restrictions for this procedure.   Follow-Up: At [facility], you and your health needs are our priority.  As part of our continuing mission to provide you with exceptional heart care, we have created designated Provider Care Teams.  These Care Teams include your primary Cardiologist (physician) and Advanced Practice Providers (APPs -  Physician Assistants and Nurse Practitioners) who all work together to provide you with the care you need, when you need it.  We recommend signing up for the patient portal called "MyChart".  Sign up information is provided on this After Visit Summary.  MyChart is used to connect with patients for Virtual Visits (Telemedicine).  Patients are able to view lab/test results, encounter notes, upcoming appointments, etc.  Non-urgent messages can be sent to your provider as well.   To learn more about what you can do with MyChart, go to [email or web address].    Your next appointment:   12 months  The format for your next appointment:   In Person  Provider:  Dr. Debara Pickett  --Please call our office 2 months in advance to schedule your follow-up appointment.      Signed, Kerin Ransom, PA-C  02/23/2020 2:26 PM    [facility]

## 2020-02-23 NOTE — Telephone Encounter (Signed)
   Primary Cardiologist: Chrystie Nose, MD  Chart reviewed and patient seen in the office today by Dr Rennis Golden and myself as part of pre-operative protocol coverage. Given past medical history and time since last visit, based on ACC/AHA guidelines, IZEN PETZ would be at acceptable risk for the planned procedure without further cardiovascular testing.   Ok to hold aspirin if needed-3/5 days pre op.   I will route this recommendation to the requesting party via Epic fax function and remove from pre-op pool.  Please call with questions.  Corine Shelter, PA-C 02/23/2020, 2:33 PM

## 2020-02-24 ENCOUNTER — Other Ambulatory Visit: Payer: Self-pay | Admitting: Cardiology

## 2020-02-24 ENCOUNTER — Ambulatory Visit (HOSPITAL_COMMUNITY): Payer: PPO | Attending: Internal Medicine

## 2020-02-24 DIAGNOSIS — Z01818 Encounter for other preprocedural examination: Secondary | ICD-10-CM | POA: Diagnosis not present

## 2020-02-24 DIAGNOSIS — I429 Cardiomyopathy, unspecified: Secondary | ICD-10-CM

## 2020-02-24 DIAGNOSIS — Z01810 Encounter for preprocedural cardiovascular examination: Secondary | ICD-10-CM | POA: Diagnosis not present

## 2020-02-24 DIAGNOSIS — Z951 Presence of aortocoronary bypass graft: Secondary | ICD-10-CM

## 2020-02-24 DIAGNOSIS — I483 Typical atrial flutter: Secondary | ICD-10-CM

## 2020-02-24 MED ORDER — PERFLUTREN LIPID MICROSPHERE
1.0000 mL | INTRAVENOUS | Status: AC | PRN
  Administered 2020-02-24: 2 mL via INTRAVENOUS

## 2020-02-28 ENCOUNTER — Other Ambulatory Visit: Payer: Self-pay | Admitting: Internal Medicine

## 2020-02-28 NOTE — Telephone Encounter (Signed)
*  STAT* If patient is at the pharmacy, call can be transferred to refill team.   1. Which medications need to be refilled? (please list name of each medication and dose if known) Evolocumab (REPATHA SURECLICK) 140 MG/ML SOAJ  2. Which pharmacy/location (including street and city if local pharmacy) is medication to be sent to? [id number] - [place], [place] - [address]  3. Do they need a 30 day or 90 day supply? 90 day  Patient states he needs 90 days because it is cheaper.

## 2020-02-29 MED ORDER — REPATHA SURECLICK 140 MG/ML ~~LOC~~ SOAJ: 140.0000 mg | SUBCUTANEOUS | 3 refills | Status: DC

## 2020-03-05 NOTE — Progress Notes (Signed)
PCP - Benedetto Goad Cardiologist - Zoila Shutter  Clearance 02-23-20 epic  Chest x-ray -  EKG - 02-23-20 epic Stress Test -  ECHO - 02-24-20 epic Cardiac Cath -   Sleep Study -  CPAP -   Fasting Blood Sugar -  Checks Blood Sugar _____ times a day  Blood Thinner Instructions: Aspirin Instructions: hold asa 3-5 days pereop if needed Last Dose:  Anesthesia review: cabg 2000 , MI, CAD, Aflutter w/ ablation  Patient denies shortness of breath, fever, cough and chest pain at PAT appointment   Patient verbalized understanding of instructions that were given to them at the PAT appointment. Patient was also instructed that they will need to review over the PAT instructions again at home before surgery.

## 2020-03-06 ENCOUNTER — Encounter (HOSPITAL_COMMUNITY)
Admission: RE | Admit: 2020-03-06 | Discharge: 2020-03-06 | Disposition: A | Discharge status: Home / Self Care | Payer: PPO | Source: Ambulatory Visit | Attending: Orthopedic Surgery | Admitting: Orthopedic Surgery

## 2020-03-06 ENCOUNTER — Encounter (HOSPITAL_COMMUNITY): Payer: Self-pay

## 2020-03-06 ENCOUNTER — Other Ambulatory Visit: Payer: Self-pay

## 2020-03-06 DIAGNOSIS — E785 Hyperlipidemia, unspecified: Secondary | ICD-10-CM | POA: Insufficient documentation

## 2020-03-06 DIAGNOSIS — I2510 Atherosclerotic heart disease of native coronary artery without angina pectoris: Secondary | ICD-10-CM | POA: Insufficient documentation

## 2020-03-06 DIAGNOSIS — Z01812 Encounter for preprocedural laboratory examination: Secondary | ICD-10-CM | POA: Insufficient documentation

## 2020-03-06 DIAGNOSIS — I252 Old myocardial infarction: Secondary | ICD-10-CM | POA: Insufficient documentation

## 2020-03-06 DIAGNOSIS — Z7901 Long term (current) use of anticoagulants: Secondary | ICD-10-CM | POA: Diagnosis not present

## 2020-03-06 DIAGNOSIS — F17210 Nicotine dependence, cigarettes, uncomplicated: Secondary | ICD-10-CM | POA: Diagnosis not present

## 2020-03-06 DIAGNOSIS — Z79899 Other long term (current) drug therapy: Secondary | ICD-10-CM | POA: Diagnosis not present

## 2020-03-06 DIAGNOSIS — Z8616 Personal history of COVID-19: Secondary | ICD-10-CM | POA: Insufficient documentation

## 2020-03-06 DIAGNOSIS — M25351 Other instability, right hip: Secondary | ICD-10-CM | POA: Insufficient documentation

## 2020-03-06 HISTORY — DX: COVID-19: U07.1

## 2020-03-06 HISTORY — DX: Pneumonia, unspecified organism: J18.9

## 2020-03-06 HISTORY — DX: Restless legs syndrome: G25.81

## 2020-03-06 HISTORY — DX: Cardiac arrhythmia, unspecified: I49.9

## 2020-03-06 HISTORY — DX: Gastro-esophageal reflux disease without esophagitis: K21.9

## 2020-03-06 NOTE — Patient Instructions (Addendum)
DUE TO COVID-19 ONLY ONE VISITOR IS ALLOWED TO COME WITH YOU AND STAY IN THE WAITING ROOM ONLY DURING PRE OP AND PROCEDURE DAY OF SURGERY. Two  VISITORS  MAY VISIT WITH YOU AFTER SURGERY IN YOUR PRIVATE ROOM DURING VISITING HOURS ONLY!   10am- 8pm  YOU NEED TO HAVE A COVID 19 TEST ON_4-5-21______ @_240pm______ , THIS TEST MUST BE DONE BEFORE SURGERY, COME  [address], [place] , .  [facility]) ONCE YOUR COVID TEST IS COMPLETED, PLEASE BEGIN THE QUARANTINE INSTRUCTIONS AS OUTLINED IN YOUR HANDOUT.                Lee Mclaughlin  03/06/2020   Your procedure is scheduled on: 03-15-20   Report to [facility] Main  Entrance   Report to admitting at       0745 AM     Call this number if you have problems the morning of surgery [phone]    Remember: NO SOLID FOOD AFTER MIDNIGHT THE NIGHT PRIOR TO SURGERY. NOTHING BY MOUTH EXCEPT CLEAR LIQUIDS UNTIL   0715 am  . PLEASE FINISH ENSURE DRINK PER SURGEON ORDER  WHICH NEEDS TO BE COMPLETED AT    0715 am then nothing by mouth .    CLEAR LIQUID DIET   Foods Allowed                                                                                       Foods Excluded  Coffee and tea, regular and decaf   No creamer                                             liquids that you cannot  Plain Jell-O any favor except red or purple                                           see through such as: Fruit ices (not with fruit pulp)                                                                       milk, soups, orange juice  Iced Popsicles                                                                    All solid food Carbonated beverages, regular and diet  Cranberry, grape and apple juices Sports drinks like Gatorade Lightly seasoned clear broth or consume(fat free) Sugar, honey syrup  ____________________________________________________________________    BRUSH YOUR TEETH  MORNING OF SURGERY AND RINSE YOUR MOUTH OUT, NO CHEWING GUM CANDY OR MINTS.     Take these medicines the morning of surgery with A SIP OF WATER: requip if needed, carvedilol, inhaler if needed and bring with you, Tessalon pearls if needed                                 You may not have any metal on your body including hair pins and              piercings  Do not wear jewelry,, lotions, powders or perfumes, deodorant                         Men may shave face and neck.   Do not bring valuables to the hospital. [facility] IS NOT             RESPONSIBLE   FOR VALUABLES.  Contacts, dentures or bridgework may not be worn into surgery. .               Please read over the following fact sheets you were given: _____________________________________________________________________           [facility] - Preparing for Surgery Before surgery, you can play an important role.  Because skin is not sterile, your skin needs to be as free of germs as possible.  You can reduce the number of germs on your skin by washing with CHG (chlorahexidine gluconate) soap before surgery.  CHG is an antiseptic cleaner which kills germs and bonds with the skin to continue killing germs even after washing. Please DO NOT use if you have an allergy to CHG or antibacterial soaps.  If your skin becomes reddened/irritated stop using the CHG and inform your nurse when you arrive at Short Stay. Do not shave (including legs and underarms) for at least 48 hours prior to the first CHG shower.  You may shave your face/neck. Please follow these instructions carefully:  1.  Shower with CHG Soap the night before surgery and the  morning of Surgery.  2.  If you choose to wash your hair, wash your hair first as usual with your  normal  shampoo.  3.  After you shampoo, rinse your hair and body thoroughly to remove the  shampoo.                           4.  Use CHG as you would any other liquid soap.  You can apply chg directly  to  the skin and wash                       Gently with a scrungie or clean washcloth.  5.  Apply the CHG Soap to your body ONLY FROM THE NECK DOWN.   Do not use on face/ open                           Wound or open sores. Avoid contact with eyes, ears mouth and genitals (private parts).  Wash face,  Genitals (private parts) with your normal soap.             6.  Wash thoroughly, paying special attention to the area where your surgery  will be performed.  7.  Thoroughly rinse your body with warm water from the neck down.  .  DO NOT shower/wash with your normal s8oap after using and rinsing off  the CHG Soap.                9.  Pat yourself dry with a clean towel.            10.  Wear clean pajamas.            11.  Place clean sheets on your bed the night of your first shower and do not  sleep with pets. Day of Surgery : Do not apply any lotions/deodorants the morning of surgery.  Please wear clean clothes to the hospital/surgery center.  FAILURE TO FOLLOW THESE INSTRUCTIONS MAY RESULT IN THE CANCELLATION OF YOUR SURGERY PATIENT SIGNATURE_________________________________  NURSE SIGNATURE__________________________________  ________________________________________________________________________   Lee Mclaughlin  An incentive spirometer is a tool that can help keep your lungs clear and active. This tool measures how well you are filling your lungs with each breath. Taking long deep breaths may help reverse or decrease the chance of developing breathing (pulmonary) problems (especially infection) following:  A long period of time when you are unable to move or be active. BEFORE THE PROCEDURE   If the spirometer includes an indicator to show your best effort, your nurse or respiratory therapist will set it to a desired goal.  If possible, sit up straight or lean slightly forward. Try not to slouch.  Hold the incentive spirometer in an upright position. INSTRUCTIONS  FOR USE  1. Sit on the edge of your bed if possible, or sit up as far as you can in bed or on a chair. 2. Hold the incentive spirometer in an upright position. 3. Breathe out normally. 4. Place the mouthpiece in your mouth and seal your lips tightly around it. 5. Breathe in slowly and as deeply as possible, raising the piston or the ball toward the top of the column. 6. Hold your breath for 3-5 seconds or for as long as possible. Allow the piston or ball to fall to the bottom of the column. 7. Remove the mouthpiece from your mouth and breathe out normally. 8. Rest for a few seconds and repeat Steps 1 through 7 at least 10 times every 1-2 hours when you are awake. Take your time and take a few normal breaths between deep breaths. 9. The spirometer may include an indicator to show your best effort. Use the indicator as a goal to work toward during each repetition. 10. After each set of 10 deep breaths, practice coughing to be sure your lungs are clear. If you have an incision (the cut made at the time of surgery), support your incision when coughing by placing a pillow or rolled up towels firmly against it. Once you are able to get out of bed, walk around indoors and cough well. You may stop using the incentive spirometer when instructed by your caregiver.  RISKS AND COMPLICATIONS  Take your time so you do not get dizzy or light-headed.  If you are in pain, you may need to take or ask for pain medication before doing incentive spirometry. It is harder to take a deep breath if you are having pain.  AFTER USE  Rest and breathe slowly and easily.  It can be helpful to keep track of a log of your progress. Your caregiver can provide you with a simple table to help with this. If you are using the spirometer at home, follow these instructions: SEEK MEDICAL CARE IF:   You are having difficultly using the spirometer.  You have trouble using the spirometer as often as instructed.  Your pain  medication is not giving enough relief while using the spirometer.  You develop fever of 100.5 F (38.1 C) or higher. SEEK IMMEDIATE MEDICAL CARE IF:   You cough up bloody sputum that had not been present before.  You develop fever of 102 F (38.9 C) or greater.  You develop worsening pain at or near the incision site. MAKE SURE YOU:   Understand these instructions.  Will watch your condition.  Will get help right away if you are not doing well or get worse. Document Released: 04/06/2007 Document Revised: 02/16/2012 Document Reviewed: 06/07/2007 Exit[employer] Patient Information 2014 [employer], [place].   ________________________________________________________________________  WHAT IS A BLOOD TRANSFUSION? Blood Transfusion Information  A transfusion is the replacement of blood or some of its parts. Blood is made up of multiple cells which provide different functions.  Red blood cells carry oxygen and are used for blood loss replacement.  White blood cells fight against infection.  Platelets control bleeding.  Plasma helps clot blood.  Other blood products are available for specialized needs, such as hemophilia or other clotting disorders. BEFORE THE TRANSFUSION  Who gives blood for transfusions?   Healthy volunteers who are fully evaluated to make sure their blood is safe. This is blood bank blood. Transfusion therapy is the safest it has ever been in the practice of medicine. Before blood is taken from a donor, a complete history is taken to make sure that person has no history of diseases nor engages in risky social behavior (examples are intravenous drug use or sexual activity with multiple partners). The donor's travel history is screened to minimize risk of transmitting infections, such as malaria. The donated blood is tested for signs of infectious diseases, such as HIV and hepatitis. The blood is then tested to be sure it is compatible with you in order to minimize the  chance of a transfusion reaction. If you or a relative donates blood, this is often done in anticipation of surgery and is not appropriate for emergency situations. It takes many days to process the donated blood. RISKS AND COMPLICATIONS Although transfusion therapy is very safe and saves many lives, the main dangers of transfusion include:   Getting an infectious disease.  Developing a transfusion reaction. This is an allergic reaction to something in the blood you were given. Every precaution is taken to prevent this. The decision to have a blood transfusion has been considered carefully by your caregiver before blood is given. Blood is not given unless the benefits outweigh the risks. AFTER THE TRANSFUSION  Right after receiving a blood transfusion, you will usually feel much better and more energetic. This is especially true if your red blood cells have gotten low (anemic). The transfusion raises the level of the red blood cells which carry oxygen, and this usually causes an energy increase.  The nurse administering the transfusion will monitor you carefully for complications. HOME CARE INSTRUCTIONS  No special instructions are needed after a transfusion. You may find your energy is better. Speak with your caregiver about any limitations on activity for underlying diseases  you may have. SEEK MEDICAL CARE IF:   Your condition is not improving after your transfusion.  You develop redness or irritation at the intravenous (IV) site. SEEK IMMEDIATE MEDICAL CARE IF:  Any of the following symptoms occur over the next 12 hours:  Shaking chills.  You have a temperature by mouth above 102 F (38.9 C), not controlled by medicine.  Chest, back, or muscle pain.  People around you feel you are not acting correctly or are confused.  Shortness of breath or difficulty breathing.  Dizziness and fainting.  You get a rash or develop hives.  You have a decrease in urine output.  Your urine  turns a dark color or changes to pink, red, or brown. Any of the following symptoms occur over the next 10 days:  You have a temperature by mouth above 102 F (38.9 C), not controlled by medicine.  Shortness of breath.  Weakness after normal activity.  The white part of the eye turns yellow (jaundice).  You have a decrease in the amount of urine or are urinating less often.  Your urine turns a dark color or changes to pink, red, or brown. Document Released: 11/21/2000 Document Revised: 02/16/2012 Document Reviewed: 07/10/2008 [facility] Patient Information 2014 [place], [place].  _______________________________________________________________________

## 2020-03-07 ENCOUNTER — Encounter (HOSPITAL_COMMUNITY)
Admission: RE | Admit: 2020-03-07 | Discharge: 2020-03-07 | Disposition: A | Discharge status: Home / Self Care | Payer: PPO | Source: Ambulatory Visit | Attending: Orthopedic Surgery | Admitting: Orthopedic Surgery

## 2020-03-07 DIAGNOSIS — Z01812 Encounter for preprocedural laboratory examination: Secondary | ICD-10-CM | POA: Diagnosis not present

## 2020-03-07 LAB — SURGICAL PCR SCREEN
MRSA, PCR: NEGATIVE
Staphylococcus aureus: POSITIVE — AB

## 2020-03-07 LAB — CBC
HCT: 50.8 % (ref 39.0–52.0)
Hemoglobin: 16.9 g/dL (ref 13.0–17.0)
MCH: 31 pg (ref 26.0–34.0)
MCHC: 33.3 g/dL (ref 30.0–36.0)
MCV: 93 fL (ref 80.0–100.0)
Platelets: 150 10*3/uL (ref 150–400)
RBC: 5.46 MIL/uL (ref 4.22–5.81)
RDW: 13.3 % (ref 11.5–15.5)
WBC: 7.8 10*3/uL (ref 4.0–10.5)
nRBC: 0 % (ref 0.0–0.2)

## 2020-03-07 LAB — BASIC METABOLIC PANEL
Anion gap: 10 (ref 5–15)
BUN: 26 mg/dL — ABNORMAL HIGH (ref 8–23)
CO2: 22 mmol/L (ref 22–32)
Calcium: 9.8 mg/dL (ref 8.9–10.3)
Chloride: 102 mmol/L (ref 98–111)
Creatinine, Ser: 0.89 mg/dL (ref 0.61–1.24)
GFR calc Af Amer: >60 mL/min (ref >60)
GFR calc non Af Amer: >60 mL/min (ref >60)
Glucose, Bld: 105 mg/dL — ABNORMAL HIGH (ref 70–99)
Potassium: 4.5 mmol/L (ref 3.5–5.1)
Sodium: 134 mmol/L — ABNORMAL LOW (ref 135–145)

## 2020-03-07 LAB — ABO/RH: ABO/RH(D): AB POS

## 2020-03-08 NOTE — Progress Notes (Signed)
Anesthesia Chart Review   Case: [id number] Date/Time: 03/15/20 1000   Procedure: TOTAL HIP REVISION, posterior approach (Right Hip) - 90 mins posterior approach   Anesthesia type: Spinal   Pre-op diagnosis: Right total hip instability   [facility] ROOM 09 / [facility]   Surgeons: Paralee Cancel, MD      DISCUSSION:68 y.o. current some day smoker with h/o PONV, CAD (CABG 2000), atrial flutter s/p ablation 09/2018 with revision 10/2018, GERD, right total hip instability scheduled for above procedure 03/15/2020 with Dr. Paralee Cancel.   Pt cleared by cardiology 02/23/2020.  Per Lee Ransom, PA-C, "Chart reviewed and patient seen in the office today by Dr Lee Mclaughlin and myself as part of pre-operative protocol coverage. Given past medical history and time since last visit, based on ACC/AHA guidelines, Lee Mclaughlin would be at acceptable risk for the planned procedure without further cardiovascular testing.   Ok to hold aspirin if needed-3/5 days pre op."  Anticipate pt can proceed with planned procedure barring acute status change.   VS: BP 136/84 (BP Location: Left Arm)   Pulse 85   Temp 36.7 C (Oral)   Resp 18   Ht '5\' 8"'  (1.727 m)   Wt 107.1 kg   SpO2 97%   BMI 35.90 kg/m   PROVIDERS: [name], MD is PCP   Lyman Bishop, MD is Cardiologist  LABS: Labs reviewed: Acceptable for surgery. (all labs ordered are listed, but only abnormal results are displayed)  Labs Reviewed  SURGICAL PCR SCREEN - Abnormal; Notable for the following components:      Result Value   Staphylococcus aureus POSITIVE (*)    All other components within normal limits  BASIC METABOLIC PANEL - Abnormal; Notable for the following components:   Sodium 134 (*)    Glucose, Bld 105 (*)    BUN 26 (*)    All other components within normal limits  CBC  TYPE AND SCREEN  ABO/RH     IMAGES:   EKG: 02/23/2020 Rate 76 bpm  Sinus rhythm with 1st degree AV block  Septal infarct, age undetermined   CV: Echo  02/24/2020 IMPRESSIONS  1. Left ventricular ejection fraction, by estimation, is 55 to 60%. The  left ventricle has normal function. The left ventricle has no regional  wall motion abnormalities. Left ventricular diastolic parameters are  indeterminate.  2. Right ventricular systolic function is normal. The right ventricular  size is normal.  3. The mitral valve is abnormal. Mild mitral valve regurgitation.  4. The aortic valve is abnormal. Aortic valve regurgitation is not  visualized. Mild to moderate aortic valve sclerosis/calcification is  present, without any evidence of aortic stenosis.  5. The inferior vena cava is normal in size with greater than 50%  respiratory variability, suggesting right atrial pressure of 3 mmHg.   Comparison(s): The left ventricular function has improved. Compared to  previous echo, LVEF has improved.   Myocardial Perfusion Imaging 05/24/2015 Low risk pharmacological stress nuclear study with a small and mild apicoseptal fixed perfusion defect and normal left ventricular regional and global systolic function. Past Medical History:  Diagnosis Date  . Coronary artery disease   . COVID-19    In december 2020  . Dyslipidemia   . Dysrhythmia    a flutter  . GERD (gastroesophageal reflux disease)   . H/O cardiovascular stress test 10/19/2012   MET test - good effort, peak VO2 of almost 112% predicted, HR 79% predicted, mildly ischemia at end of exercise  .  History of hyperthyroidism as a child   . History of nuclear stress test 03/2011   bruce myoview; mild perfusion defect in mid anteroseptal, apical septal, apical regions (infarct/scar/breast attenuation); post-stress EF 55%; no signficant ischemia  . Ischemic cardiomyopathy    history of   . Myocardial infarction ([place])   . Pneumonia    as a kid  . PONV (postoperative nausea and vomiting)   . Primary localized osteoarthritis of right hip 05/24/2019  . Primary localized osteoarthritis of right knee    . Restless leg   . right total hip arthroplasty with dislocation of hip ([place]) 08/14/2019  . S/P CABG (coronary artery bypass graft) 2000  . 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 Wears glasses     Past Surgical History:  Procedure Laterality Date  . A-FLUTTER ABLATION N/A 10/01/2018   Procedure: A-FLUTTER ABLATION;  Surgeon: Evans Lance, MD;  Location: [place];  Service: Cardiovascular;  Laterality: N/A;  . A-FLUTTER ABLATION N/A 11/02/2018   Procedure: A-FLUTTER ABLATION;  Surgeon: Evans Lance, MD;  Location: [place] [facility];  Service: Cardiovascular;  Laterality: N/A;  . CARDIAC CATHETERIZATION  06/01/2007   no obstructive CAD, patent grafts, EF 40%, apical hypokinesis, anterolateral hypokinesis (Dr. Gerrie Nordmann)   . CERVICAL SPINE SURGERY    . CHOLECYSTECTOMY    . COLONOSCOPY    . CORONARY ARTERY BYPASS GRAFT  06/05/1999   LIMA to LAD, SVG to diagonal 1, SVG to PDA of Cfx, right radial graft to OM1 (Dr. Remus Loffler)   . ESOPHAGOGASTRODUODENOSCOPY    . EYE SURGERY    . KNEE SURGERY Right    pt. states x 6   . TOTAL HIP ARTHROPLASTY Right 05/24/2019   Procedure: TOTAL HIP ARTHROPLASTY;  Surgeon: Marchia Bond, MD;  [facility];  Service: Orthopedics;  Laterality: Right;  . TOTAL KNEE ARTHROPLASTY Right 07/23/2015   Procedure: RIGHT TOTAL KNEE ARTHROPLASTY;  Surgeon: Elsie Saas, MD;  Location: [facility];  Service: Orthopedics;  Laterality: Right;  . TRANSTHORACIC ECHOCARDIOGRAM  02/2008   EF 40%, severe apical wall hypokinesis, mod-severe anterior wall hypokinesis; RV mildly dilated; mild mitral annular calcif; mild TR, RSVP 30-26mHg    MEDICATIONS: . albuterol (PROAIR HFA) 108 (90 Base) MCG/ACT inhaler  . benzonatate (TESSALON) 200 MG capsule  . carvedilol (COREG) 12.5 MG tablet  . Evolocumab (REPATHA SURECLICK) 1628MG/ML SOAJ  . loratadine (CLARITIN) 10 MG tablet  . olmesartan (BENICAR) 40 MG tablet  . rOPINIRole (REQUIP) 1 MG tablet   No  current facility-administered medications for this encounter.    JMaia PlanWSelect Specialty Hospital - South DallasPre-Surgical Testing ([id number]/01/21  1:04 PM

## 2020-03-12 ENCOUNTER — Other Ambulatory Visit (HOSPITAL_COMMUNITY)
Admission: RE | Admit: 2020-03-12 | Discharge: 2020-03-12 | Disposition: A | Discharge status: Home / Self Care | Payer: PPO | Source: Ambulatory Visit | Attending: Orthopedic Surgery | Admitting: Orthopedic Surgery

## 2020-03-12 DIAGNOSIS — Z20822 Contact with and (suspected) exposure to covid-19: Secondary | ICD-10-CM | POA: Insufficient documentation

## 2020-03-12 DIAGNOSIS — Z01812 Encounter for preprocedural laboratory examination: Secondary | ICD-10-CM | POA: Diagnosis not present

## 2020-03-12 LAB — SARS CORONAVIRUS 2 (TAT 6-24 HRS): SARS Coronavirus 2: NEGATIVE

## 2020-03-12 NOTE — H&P (Signed)
TOTAL HIP REVISION ADMISSION H&P  Patient is admitted for right revision total hip arthroplasty, posterior approach.  Subjective:  Chief Complaint: Instability of right total hip arthroplasty  HPI: Lee Mclaughlin, 69 y.o. male, has a history of pain and functional disability in the right hip due to instability and patient has failed non-surgical conservative treatments for greater than 12 weeks to include NSAID's and/or analgesics, supervised PT with diminished ADL's post treatment, use of assistive devices and activity modification. The indications for the revision total hip arthroplasty are instability / dislocations.  Onset of symptoms was abrupt starting  months ago with gradually worsening course since that time.  Prior procedures on the right hip include arthroplasty 3 months ago by Dr. Thea Alken.  Patient currently rates pain in the right hip at 10 out of 10 with a dislocation.  There is worsening of pain with activity and weight bearing, trendelenberg gait, pain that interfers with activities of daily living and pain with passive range of motion. Patient has evidence of previous THA by imaging studies.  This condition presents safety issues increasing the risk of falls.   There is no current active infection.  Risks, benefits and expectations were discussed with the patient.  Risks including but not limited to the risk of anesthesia, blood clots, nerve damage, blood vessel damage, failure of the prosthesis, infection and up to and including death.  Patient understand the risks, benefits and expectations and wishes to proceed with surgery.   PCP: [name], MD  D/C Plans:       Home  Post-op Meds:       No Rx given  Tranexamic Acid:      To be given - IV   Decadron:      Is to be given  FYI:      ASA  Norco  DME:   Pt already has equipment  PT:   HEP   Patient Active Problem List   Diagnosis Date Noted  . Pre-operative clearance 02/23/2020  . Statin myopathy 12/07/2019  .  right total hip arthroplasty with dislocation of hip ([place]) 08/14/2019  . Primary localized osteoarthritis of right hip 05/24/2019  . Mixed hyperlipidemia 03/21/2019  . Atrial flutter ([place]) 11/02/2018  . Typical atrial flutter ([place]) 09/24/2018  . Cough 03/19/2018  . Erectile dysfunction 03/19/2018  . Seasonal allergic rhinitis due to pollen 03/19/2018  . Inflamed seborrheic keratosis 10/31/2016  . Primary localized osteoarthritis of right knee 09/22/2016  . Staphylococcal arthritis of right knee ([place]) 09/22/2016  . DJD (degenerative joint disease) of knee 07/23/2015  . CAD (coronary artery disease) 11/22/2013  . S/P CABG x 4 11/22/2013  . Dyslipidemia 11/22/2013  . Cardiomyopathy ([place]) 11/22/2013  . Statin intolerance 11/22/2013   Past Medical History:  Diagnosis Date  . Coronary artery disease   . COVID-19    In december 2020  . Dyslipidemia   . Dysrhythmia    a flutter  . GERD (gastroesophageal reflux disease)   . H/O cardiovascular stress test 10/19/2012   MET test - good effort, peak VO2 of almost 112% predicted, HR 79% predicted, mildly ischemia at end of exercise  . History of hyperthyroidism as a child   . History of nuclear stress test 03/2011   bruce myoview; mild perfusion defect in mid anteroseptal, apical septal, apical regions (infarct/scar/breast attenuation); post-stress EF 55%; no signficant ischemia  . Ischemic cardiomyopathy    history of   . Myocardial infarction ([place])   . Pneumonia  as a kid  . PONV (postoperative nausea and vomiting)   . Primary localized osteoarthritis of right hip 05/24/2019  . Primary localized osteoarthritis of right knee   . Restless leg   . right total hip arthroplasty with dislocation of hip ([place]) 08/14/2019  . S/P CABG (coronary artery bypass graft) 2000  . Staphylococcal arthritis of right knee ([place]) 2004  . Wears glasses     Past Surgical History:  Procedure Laterality Date  . A-FLUTTER ABLATION N/A 10/01/2018   Procedure:  A-FLUTTER ABLATION;  Surgeon: Evans Lance, MD;  Location: [facility];  Service: Cardiovascular;  Laterality: N/A;  . A-FLUTTER ABLATION N/A 11/02/2018   Procedure: A-FLUTTER ABLATION;  Surgeon: Evans Lance, MD;  Location: [facility];  Service: Cardiovascular;  Laterality: N/A;  . CARDIAC CATHETERIZATION  06/01/2007   no obstructive CAD, patent grafts, EF 40%, apical hypokinesis, anterolateral hypokinesis (Dr. Gerrie Nordmann)   . CERVICAL SPINE SURGERY    . CHOLECYSTECTOMY    . COLONOSCOPY    . CORONARY ARTERY BYPASS GRAFT  06/05/1999   LIMA to LAD, SVG to diagonal 1, SVG to PDA of Cfx, right radial graft to OM1 (Dr. Remus Loffler)   . ESOPHAGOGASTRODUODENOSCOPY    . EYE SURGERY    . KNEE SURGERY Right    pt. states x 6   . TOTAL HIP ARTHROPLASTY Right 05/24/2019   Procedure: TOTAL HIP ARTHROPLASTY;  Surgeon: Marchia Bond, MD;  Location: [facility];  Service: Orthopedics;  Laterality: Right;  . TOTAL KNEE ARTHROPLASTY Right 07/23/2015   Procedure: RIGHT TOTAL KNEE ARTHROPLASTY;  Surgeon: Elsie Saas, MD;  Location: [facility];  Service: Orthopedics;  Laterality: Right;  . TRANSTHORACIC ECHOCARDIOGRAM  02/2008   EF 40%, severe apical wall hypokinesis, mod-severe anterior wall hypokinesis; RV mildly dilated; mild mitral annular calcif; mild TR, RSVP 30-84mHg    No current facility-administered medications for this encounter.   Current Outpatient Medications  Medication Sig Dispense Refill Last Dose  . albuterol (PROAIR HFA) 108 (90 Base) MCG/ACT inhaler Inhale 2 puffs into the lungs every 6 (six) hours as needed for wheezing or shortness of breath.     . benzonatate (TESSALON) 200 MG capsule Take 200 mg by mouth in the morning, at noon, and at bedtime.     . carvedilol (COREG) 12.5 MG tablet Take 12.5 mg by mouth 2 (two) times daily with a meal.     . Evolocumab (REPATHA SURECLICK) 1440MG/ML SOAJ Inject 140 mg into the skin every 14 (fourteen) days. 6 pen 3   . loratadine (CLARITIN) 10  MG tablet Take 10 mg by mouth daily as needed for allergies.     .Marland Kitchenolmesartan (BENICAR) 40 MG tablet Take 40 mg by mouth every morning.      .Marland KitchenrOPINIRole (REQUIP) 1 MG tablet Take 2-3 mg by mouth 3 (three) times daily as needed (restless legs (max 6 tablets per day)).       Allergies  Allergen Reactions  . Morphine And Related Nausea And Vomiting  . Crestor [Rosuvastatin] Other (See Comments)    Myalgias   . Vytorin [Ezetimibe-Simvastatin] Other (See Comments)    Myalgias     Social History   Tobacco Use  . Smoking status: Current Some Day Smoker    Types: Cigars  . Smokeless tobacco: Never Used  . Tobacco comment: cigar occassionally  Substance Use Topics  . Alcohol use: Yes    Alcohol/week: 0.0 standard drinks    Comment: minimal  Family History  Problem Relation Age of Onset  . CAD Father        s/p CABGx4 at 67  . Hyperlipidemia Father      Review of Systems  Constitutional: Negative.   HENT: Negative.   Eyes: Negative.   Respiratory: Negative.   Cardiovascular: Negative.   Gastrointestinal: Negative.   Genitourinary: Negative.   Musculoskeletal: Positive for joint pain.  Skin: Negative.   Neurological: Negative.   Endo/Heme/Allergies: Positive for environmental allergies.  Psychiatric/Behavioral: Negative.       Objective:  Physical Exam  Constitutional: He is oriented to person, place, and time. He appears well-developed.  HENT:  Head: Normocephalic.  Eyes: Pupils are equal, round, and reactive to light.  Neck: No JVD present. No tracheal deviation present. No thyromegaly present.  Cardiovascular: Normal rate, regular rhythm and intact distal pulses.  Respiratory: Effort normal and breath sounds normal. No respiratory distress. He has no wheezes.  GI: Soft. There is no abdominal tenderness. There is no guarding.  Musculoskeletal:     Cervical back: Neck supple.     Right hip: Tenderness and bony tenderness present. No lacerations (healed previous  incision). Decreased range of motion. Decreased strength.  Lymphadenopathy:    He has no cervical adenopathy.  Neurological: He is alert and oriented to person, place, and time.  Skin: Skin is warm and dry.  Psychiatric: He has a normal mood and affect.      Labs:  Estimated body mass index is 35.9 kg/m as calculated from the following:   Height as of 03/07/20: '5\' 8"'  (1.727 m).   Weight as of 03/07/20: 107.1 kg.  Imaging Review:  Plain radiographs demonstrate previous THA of the right hip. The bone quality appears to be good for age and reported activity level.    Assessment/Plan:  Right hip with failed previous arthroplasty.  The patient history, physical examination, clinical judgement of the provider and imaging studies are consistent with instability of the right hip(s), previous total hip arthroplasty. Revision total hip arthroplasty is deemed medically necessary. The treatment options including medical management, injection therapy, arthroscopy and arthroplasty were discussed at length. The risks and benefits of total hip arthroplasty were presented and reviewed. The risks due to aseptic loosening, infection, stiffness, dislocation/subluxation,  thromboembolic complications and other imponderables were discussed.  The patient acknowledged the explanation, agreed to proceed with the plan and consent was signed. Patient is being admitted for treatment for surgery, pain control, PT, OT, prophylactic antibiotics, VTE prophylaxis, progressive ambulation and ADL's and discharge planning. The patient is planning to be discharged home.   West Pugh Faigy Stretch   PA-C  03/12/2020, 12:22 PM

## 2020-03-15 ENCOUNTER — Observation Stay (HOSPITAL_COMMUNITY)
Admission: RE | Admit: 2020-03-15 | Discharge: 2020-03-16 | Disposition: A | Discharge status: Home / Self Care | Payer: PPO | Attending: Orthopedic Surgery | Admitting: Orthopedic Surgery

## 2020-03-15 ENCOUNTER — Encounter (HOSPITAL_COMMUNITY): Payer: Self-pay | Admitting: Orthopedic Surgery

## 2020-03-15 ENCOUNTER — Ambulatory Visit (HOSPITAL_COMMUNITY): Payer: PPO | Admitting: Physician Assistant

## 2020-03-15 ENCOUNTER — Other Ambulatory Visit: Payer: Self-pay

## 2020-03-15 ENCOUNTER — Ambulatory Visit (HOSPITAL_COMMUNITY): Payer: PPO

## 2020-03-15 ENCOUNTER — Ambulatory Visit (HOSPITAL_COMMUNITY): Payer: PPO | Admitting: Anesthesiology

## 2020-03-15 ENCOUNTER — Encounter (HOSPITAL_COMMUNITY)
Admission: RE | Disposition: A | Discharge status: Home / Self Care | Payer: Self-pay | Source: Home / Self Care | Attending: Orthopedic Surgery

## 2020-03-15 DIAGNOSIS — Y831 Surgical operation with implant of artificial internal device as the cause of abnormal reaction of the patient, or of later complication, without mention of misadventure at the time of the procedure: Secondary | ICD-10-CM | POA: Diagnosis not present

## 2020-03-15 DIAGNOSIS — T84028A Dislocation of other internal joint prosthesis, initial encounter: Secondary | ICD-10-CM | POA: Diagnosis not present

## 2020-03-15 DIAGNOSIS — G2581 Restless legs syndrome: Secondary | ICD-10-CM | POA: Insufficient documentation

## 2020-03-15 DIAGNOSIS — T84020A Dislocation of internal right hip prosthesis, initial encounter: Secondary | ICD-10-CM | POA: Diagnosis not present

## 2020-03-15 DIAGNOSIS — J301 Allergic rhinitis due to pollen: Secondary | ICD-10-CM | POA: Insufficient documentation

## 2020-03-15 DIAGNOSIS — E782 Mixed hyperlipidemia: Secondary | ICD-10-CM | POA: Insufficient documentation

## 2020-03-15 DIAGNOSIS — I4891 Unspecified atrial fibrillation: Secondary | ICD-10-CM | POA: Diagnosis not present

## 2020-03-15 DIAGNOSIS — Z96641 Presence of right artificial hip joint: Secondary | ICD-10-CM | POA: Diagnosis not present

## 2020-03-15 DIAGNOSIS — I2510 Atherosclerotic heart disease of native coronary artery without angina pectoris: Secondary | ICD-10-CM | POA: Diagnosis not present

## 2020-03-15 DIAGNOSIS — I255 Ischemic cardiomyopathy: Secondary | ICD-10-CM | POA: Diagnosis not present

## 2020-03-15 DIAGNOSIS — Z96651 Presence of right artificial knee joint: Secondary | ICD-10-CM | POA: Insufficient documentation

## 2020-03-15 DIAGNOSIS — Z8616 Personal history of COVID-19: Secondary | ICD-10-CM | POA: Diagnosis not present

## 2020-03-15 DIAGNOSIS — Z951 Presence of aortocoronary bypass graft: Secondary | ICD-10-CM | POA: Insufficient documentation

## 2020-03-15 DIAGNOSIS — Z79899 Other long term (current) drug therapy: Secondary | ICD-10-CM | POA: Insufficient documentation

## 2020-03-15 DIAGNOSIS — Z96649 Presence of unspecified artificial hip joint: Secondary | ICD-10-CM

## 2020-03-15 DIAGNOSIS — I252 Old myocardial infarction: Secondary | ICD-10-CM | POA: Insufficient documentation

## 2020-03-15 DIAGNOSIS — Z471 Aftercare following joint replacement surgery: Secondary | ICD-10-CM | POA: Diagnosis not present

## 2020-03-15 DIAGNOSIS — E669 Obesity, unspecified: Secondary | ICD-10-CM | POA: Diagnosis not present

## 2020-03-15 DIAGNOSIS — Z6835 Body mass index (BMI) 35.0-35.9, adult: Secondary | ICD-10-CM | POA: Diagnosis not present

## 2020-03-15 DIAGNOSIS — T84090A Other mechanical complication of internal right hip prosthesis, initial encounter: Principal | ICD-10-CM | POA: Insufficient documentation

## 2020-03-15 HISTORY — PX: TOTAL HIP REVISION: SHX763

## 2020-03-15 LAB — TYPE AND SCREEN
ABO/RH(D): AB POS
Antibody Screen: NEGATIVE

## 2020-03-15 SURGERY — TOTAL HIP REVISION
Anesthesia: Spinal | Site: Hip | Laterality: Right

## 2020-03-15 MED ORDER — KETAMINE HCL 10 MG/ML IJ SOLN
INTRAMUSCULAR | Status: AC
  Filled 2020-03-15: qty 1

## 2020-03-15 MED ORDER — METHOCARBAMOL 500 MG PO TABS
500.0000 mg | ORAL_TABLET | Freq: Four times a day (QID) | ORAL | Status: DC | PRN
  Administered 2020-03-15: 500 mg via ORAL
  Filled 2020-03-15: qty 1

## 2020-03-15 MED ORDER — IRBESARTAN 150 MG PO TABS
300.0000 mg | ORAL_TABLET | Freq: Every day | ORAL | Status: DC
  Administered 2020-03-16: 300 mg via ORAL
  Filled 2020-03-15: qty 2

## 2020-03-15 MED ORDER — LACTATED RINGERS IV BOLUS: 500.0000 mL | Freq: Once | INTRAVENOUS | Status: DC

## 2020-03-15 MED ORDER — LACTATED RINGERS IV SOLN: INTRAVENOUS | Status: DC

## 2020-03-15 MED ORDER — BENZONATATE 100 MG PO CAPS
200.0000 mg | ORAL_CAPSULE | Freq: Three times a day (TID) | ORAL | Status: DC
  Administered 2020-03-16: 200 mg via ORAL
  Filled 2020-03-15 (×2): qty 2

## 2020-03-15 MED ORDER — CEFAZOLIN SODIUM-DEXTROSE 2-4 GM/100ML-% IV SOLN
2.0000 g | Freq: Four times a day (QID) | INTRAVENOUS | Status: AC
  Administered 2020-03-15 (×2): 2 g via INTRAVENOUS
  Filled 2020-03-15 (×2): qty 100

## 2020-03-15 MED ORDER — BUPIVACAINE IN DEXTROSE 0.75-8.25 % IT SOLN
INTRATHECAL | Status: DC | PRN
  Administered 2020-03-15: 2 mL via INTRATHECAL

## 2020-03-15 MED ORDER — DEXAMETHASONE SODIUM PHOSPHATE 10 MG/ML IJ SOLN
10.0000 mg | Freq: Once | INTRAMUSCULAR | Status: DC
  Filled 2020-03-15: qty 1

## 2020-03-15 MED ORDER — METOCLOPRAMIDE HCL 5 MG PO TABS: 5.0000 mg | ORAL_TABLET | Freq: Three times a day (TID) | ORAL | Status: DC | PRN

## 2020-03-15 MED ORDER — GLYCOPYRROLATE 0.2 MG/ML IJ SOLN
INTRAMUSCULAR | Status: DC | PRN
  Administered 2020-03-15: .2 mg via INTRAVENOUS

## 2020-03-15 MED ORDER — FERROUS SULFATE 325 (65 FE) MG PO TABS: 325.0000 mg | ORAL_TABLET | Freq: Three times a day (TID) | ORAL | 0 refills | Status: DC

## 2020-03-15 MED ORDER — CELECOXIB 200 MG PO CAPS
200.0000 mg | ORAL_CAPSULE | Freq: Two times a day (BID) | ORAL | Status: DC
  Administered 2020-03-15 – 2020-03-16 (×2): 200 mg via ORAL
  Filled 2020-03-15 (×2): qty 1

## 2020-03-15 MED ORDER — ONDANSETRON HCL 4 MG/2ML IJ SOLN
INTRAMUSCULAR | Status: AC
  Filled 2020-03-15: qty 2

## 2020-03-15 MED ORDER — ONDANSETRON HCL 4 MG/2ML IJ SOLN: 4.0000 mg | Freq: Four times a day (QID) | INTRAMUSCULAR | Status: DC | PRN

## 2020-03-15 MED ORDER — HYDROCODONE-ACETAMINOPHEN 7.5-325 MG PO TABS: 1.0000 | ORAL_TABLET | ORAL | 0 refills | Status: DC | PRN

## 2020-03-15 MED ORDER — PHENYLEPHRINE HCL-NACL 10-0.9 MG/250ML-% IV SOLN
INTRAVENOUS | Status: DC | PRN
  Administered 2020-03-15: 50 ug/min via INTRAVENOUS

## 2020-03-15 MED ORDER — METHOCARBAMOL 500 MG IVPB - SIMPLE MED
INTRAVENOUS | Status: AC
  Filled 2020-03-15: qty 50

## 2020-03-15 MED ORDER — PROMETHAZINE HCL 25 MG/ML IJ SOLN: 6.2500 mg | INTRAMUSCULAR | Status: DC | PRN

## 2020-03-15 MED ORDER — FENTANYL CITRATE (PF) 100 MCG/2ML IJ SOLN
INTRAMUSCULAR | Status: AC
  Filled 2020-03-15: qty 4

## 2020-03-15 MED ORDER — PROPOFOL 500 MG/50ML IV EMUL
INTRAVENOUS | Status: AC
  Filled 2020-03-15: qty 50

## 2020-03-15 MED ORDER — ASPIRIN 81 MG PO CHEW: 81.0000 mg | CHEWABLE_TABLET | Freq: Two times a day (BID) | ORAL | 0 refills | Status: AC

## 2020-03-15 MED ORDER — ACETAMINOPHEN 325 MG PO TABS: 325.0000 mg | ORAL_TABLET | Freq: Four times a day (QID) | ORAL | Status: DC | PRN

## 2020-03-15 MED ORDER — ALBUMIN HUMAN 5 % IV SOLN: INTRAVENOUS | Status: DC | PRN

## 2020-03-15 MED ORDER — POLYETHYLENE GLYCOL 3350 17 G PO PACK: 17.0000 g | PACK | Freq: Two times a day (BID) | ORAL | 0 refills | Status: DC

## 2020-03-15 MED ORDER — BISACODYL 10 MG RE SUPP: 10.0000 mg | Freq: Every day | RECTAL | Status: DC | PRN

## 2020-03-15 MED ORDER — SODIUM CHLORIDE 0.9 % IR SOLN
Status: DC | PRN
  Administered 2020-03-15: 1000 mL

## 2020-03-15 MED ORDER — PROPOFOL 10 MG/ML IV BOLUS
INTRAVENOUS | Status: AC
  Filled 2020-03-15: qty 20

## 2020-03-15 MED ORDER — LORATADINE 10 MG PO TABS: 10.0000 mg | ORAL_TABLET | Freq: Every day | ORAL | Status: DC | PRN

## 2020-03-15 MED ORDER — DOCUSATE SODIUM 100 MG PO CAPS
100.0000 mg | ORAL_CAPSULE | Freq: Two times a day (BID) | ORAL | Status: DC
  Administered 2020-03-15: 100 mg via ORAL
  Filled 2020-03-15 (×2): qty 1

## 2020-03-15 MED ORDER — CARVEDILOL 12.5 MG PO TABS
12.5000 mg | ORAL_TABLET | Freq: Two times a day (BID) | ORAL | Status: DC
  Administered 2020-03-15 – 2020-03-16 (×2): 12.5 mg via ORAL
  Filled 2020-03-15 (×2): qty 1

## 2020-03-15 MED ORDER — DEXAMETHASONE SODIUM PHOSPHATE 10 MG/ML IJ SOLN
INTRAMUSCULAR | Status: AC
  Filled 2020-03-15: qty 1

## 2020-03-15 MED ORDER — PHENYLEPHRINE HCL-NACL 10-0.9 MG/250ML-% IV SOLN
40.0000 ug/min | INTRAVENOUS | Status: DC
  Administered 2020-03-15: 40 ug/min via INTRAVENOUS

## 2020-03-15 MED ORDER — METOCLOPRAMIDE HCL 5 MG/ML IJ SOLN: 5.0000 mg | Freq: Three times a day (TID) | INTRAMUSCULAR | Status: DC | PRN

## 2020-03-15 MED ORDER — LIDOCAINE 2% (20 MG/ML) 5 ML SYRINGE
INTRAMUSCULAR | Status: AC
  Filled 2020-03-15: qty 5

## 2020-03-15 MED ORDER — MENTHOL 3 MG MT LOZG: 1.0000 | LOZENGE | OROMUCOSAL | Status: DC | PRN

## 2020-03-15 MED ORDER — PROPOFOL 500 MG/50ML IV EMUL
INTRAVENOUS | Status: DC | PRN
  Administered 2020-03-15: 25 ug/kg/min via INTRAVENOUS

## 2020-03-15 MED ORDER — MIDAZOLAM HCL 2 MG/2ML IJ SOLN
INTRAMUSCULAR | Status: AC
  Filled 2020-03-15: qty 2

## 2020-03-15 MED ORDER — TRANEXAMIC ACID-NACL 1000-0.7 MG/100ML-% IV SOLN
1000.0000 mg | INTRAVENOUS | Status: AC
  Administered 2020-03-15: 1000 mg via INTRAVENOUS
  Filled 2020-03-15: qty 100

## 2020-03-15 MED ORDER — LACTATED RINGERS IV BOLUS: 250.0000 mL | Freq: Once | INTRAVENOUS | Status: DC

## 2020-03-15 MED ORDER — HYDROCODONE-ACETAMINOPHEN 5-325 MG PO TABS: 1.0000 | ORAL_TABLET | ORAL | Status: DC | PRN

## 2020-03-15 MED ORDER — HYDROMORPHONE HCL 1 MG/ML IJ SOLN
0.5000 mg | INTRAMUSCULAR | Status: DC | PRN
  Administered 2020-03-15 (×2): 0.5 mg via INTRAVENOUS
  Administered 2020-03-16: 1 mg via INTRAVENOUS
  Filled 2020-03-15 (×2): qty 1

## 2020-03-15 MED ORDER — ALBUMIN HUMAN 5 % IV SOLN
INTRAVENOUS | Status: AC
  Filled 2020-03-15: qty 250

## 2020-03-15 MED ORDER — DEXAMETHASONE SODIUM PHOSPHATE 10 MG/ML IJ SOLN: 10.0000 mg | Freq: Once | INTRAMUSCULAR | Status: DC

## 2020-03-15 MED ORDER — ACETAMINOPHEN 500 MG PO TABS
ORAL_TABLET | ORAL | Status: AC
  Filled 2020-03-15: qty 2

## 2020-03-15 MED ORDER — LIDOCAINE HCL (CARDIAC) PF 100 MG/5ML IV SOSY
PREFILLED_SYRINGE | INTRAVENOUS | Status: DC | PRN
  Administered 2020-03-15: 40 mg via INTRAVENOUS

## 2020-03-15 MED ORDER — TRANEXAMIC ACID-NACL 1000-0.7 MG/100ML-% IV SOLN
1000.0000 mg | Freq: Once | INTRAVENOUS | Status: AC
  Administered 2020-03-15: 1000 mg via INTRAVENOUS
  Filled 2020-03-15: qty 100

## 2020-03-15 MED ORDER — METHOCARBAMOL 500 MG PO TABS: 500.0000 mg | ORAL_TABLET | Freq: Four times a day (QID) | ORAL | 0 refills | Status: DC | PRN

## 2020-03-15 MED ORDER — ALUM & MAG HYDROXIDE-SIMETH 200-200-20 MG/5ML PO SUSP: 15.0000 mL | ORAL | Status: DC | PRN

## 2020-03-15 MED ORDER — SODIUM CHLORIDE 0.9 % IR SOLN
Status: DC | PRN
  Administered 2020-03-15: 3000 mL

## 2020-03-15 MED ORDER — SODIUM CHLORIDE 0.9 % IV SOLN: INTRAVENOUS | Status: DC

## 2020-03-15 MED ORDER — EPHEDRINE SULFATE 50 MG/ML IJ SOLN
INTRAMUSCULAR | Status: DC | PRN
  Administered 2020-03-15: 10 mg via INTRAVENOUS
  Administered 2020-03-15: 5 mg via INTRAVENOUS
  Administered 2020-03-15: 10 mg via INTRAVENOUS
  Administered 2020-03-15 (×2): 15 mg via INTRAVENOUS
  Administered 2020-03-15: 10 mg via INTRAVENOUS

## 2020-03-15 MED ORDER — PHENOL 1.4 % MT LIQD: 1.0000 | OROMUCOSAL | Status: DC | PRN

## 2020-03-15 MED ORDER — ROPINIROLE HCL 1 MG PO TABS
2.0000 mg | ORAL_TABLET | Freq: Three times a day (TID) | ORAL | Status: DC | PRN
  Administered 2020-03-15 – 2020-03-16 (×2): 3 mg via ORAL
  Filled 2020-03-15 (×2): qty 3

## 2020-03-15 MED ORDER — CELECOXIB 200 MG PO CAPS
200.0000 mg | ORAL_CAPSULE | Freq: Once | ORAL | Status: AC
  Administered 2020-03-15: 200 mg via ORAL

## 2020-03-15 MED ORDER — FENTANYL CITRATE (PF) 100 MCG/2ML IJ SOLN
25.0000 ug | INTRAMUSCULAR | Status: DC | PRN
  Administered 2020-03-15: 50 ug via INTRAVENOUS

## 2020-03-15 MED ORDER — STERILE WATER FOR IRRIGATION IR SOLN
Status: DC | PRN
  Administered 2020-03-15: 2000 mL

## 2020-03-15 MED ORDER — HYDROCODONE-ACETAMINOPHEN 7.5-325 MG PO TABS
1.0000 | ORAL_TABLET | ORAL | Status: DC | PRN
  Administered 2020-03-15 (×2): 1 via ORAL
  Administered 2020-03-15 – 2020-03-16 (×4): 2 via ORAL
  Filled 2020-03-15 (×2): qty 2
  Filled 2020-03-15: qty 1
  Filled 2020-03-15 (×2): qty 2
  Filled 2020-03-15: qty 1

## 2020-03-15 MED ORDER — MAGNESIUM CITRATE PO SOLN: 1.0000 | Freq: Once | ORAL | Status: DC | PRN

## 2020-03-15 MED ORDER — GLYCOPYRROLATE PF 0.2 MG/ML IJ SOSY
PREFILLED_SYRINGE | INTRAMUSCULAR | Status: AC
  Filled 2020-03-15: qty 1

## 2020-03-15 MED ORDER — DOCUSATE SODIUM 100 MG PO CAPS: 100.0000 mg | ORAL_CAPSULE | Freq: Two times a day (BID) | ORAL | 0 refills | Status: DC

## 2020-03-15 MED ORDER — EPHEDRINE 5 MG/ML INJ
INTRAVENOUS | Status: AC
  Filled 2020-03-15: qty 20

## 2020-03-15 MED ORDER — ASPIRIN 81 MG PO CHEW
81.0000 mg | CHEWABLE_TABLET | Freq: Two times a day (BID) | ORAL | Status: DC
  Administered 2020-03-15 – 2020-03-16 (×2): 81 mg via ORAL
  Filled 2020-03-15 (×2): qty 1

## 2020-03-15 MED ORDER — DIPHENHYDRAMINE HCL 12.5 MG/5ML PO ELIX: 12.5000 mg | ORAL_SOLUTION | ORAL | Status: DC | PRN

## 2020-03-15 MED ORDER — FENTANYL CITRATE (PF) 100 MCG/2ML IJ SOLN
INTRAMUSCULAR | Status: AC
  Filled 2020-03-15: qty 2

## 2020-03-15 MED ORDER — POLYETHYLENE GLYCOL 3350 17 G PO PACK: 17.0000 g | PACK | Freq: Two times a day (BID) | ORAL | Status: DC

## 2020-03-15 MED ORDER — METHOCARBAMOL 500 MG IVPB - SIMPLE MED
500.0000 mg | Freq: Four times a day (QID) | INTRAVENOUS | Status: DC | PRN
  Administered 2020-03-15: 500 mg via INTRAVENOUS
  Filled 2020-03-15: qty 50

## 2020-03-15 MED ORDER — FERROUS SULFATE 325 (65 FE) MG PO TABS
325.0000 mg | ORAL_TABLET | Freq: Three times a day (TID) | ORAL | Status: DC
  Administered 2020-03-15 – 2020-03-16 (×2): 325 mg via ORAL
  Filled 2020-03-15 (×2): qty 1

## 2020-03-15 MED ORDER — CELECOXIB 200 MG PO CAPS
ORAL_CAPSULE | ORAL | Status: AC
  Filled 2020-03-15: qty 1

## 2020-03-15 MED ORDER — ACETAMINOPHEN 500 MG PO TABS
1000.0000 mg | ORAL_TABLET | Freq: Once | ORAL | Status: AC
  Administered 2020-03-15: 1000 mg via ORAL

## 2020-03-15 MED ORDER — PHENYLEPHRINE HCL (PRESSORS) 10 MG/ML IV SOLN
INTRAVENOUS | Status: AC
  Filled 2020-03-15: qty 1

## 2020-03-15 MED ORDER — ONDANSETRON HCL 4 MG PO TABS: 4.0000 mg | ORAL_TABLET | Freq: Four times a day (QID) | ORAL | Status: DC | PRN

## 2020-03-15 MED ORDER — FENTANYL CITRATE (PF) 100 MCG/2ML IJ SOLN: 25.0000 ug | INTRAMUSCULAR | Status: DC | PRN

## 2020-03-15 MED ORDER — CEFAZOLIN SODIUM-DEXTROSE 2-4 GM/100ML-% IV SOLN
2.0000 g | INTRAVENOUS | Status: AC
  Administered 2020-03-15: 10:00:00 2 g via INTRAVENOUS
  Filled 2020-03-15: qty 100

## 2020-03-15 MED ORDER — ALBUTEROL SULFATE (2.5 MG/3ML) 0.083% IN NEBU: 3.0000 mL | INHALATION_SOLUTION | Freq: Four times a day (QID) | RESPIRATORY_TRACT | Status: DC | PRN

## 2020-03-15 MED ORDER — KETAMINE HCL 10 MG/ML IJ SOLN
INTRAMUSCULAR | Status: DC | PRN
  Administered 2020-03-15: 30 mg via INTRAVENOUS

## 2020-03-15 SURGICAL SUPPLY — 64 items
ADH SKN CLS APL DERMABOND .7 (GAUZE/BANDAGES/DRESSINGS) ×1
BAG DECANTER FOR FLEXI CONT (MISCELLANEOUS) ×3 IMPLANT
BAG SPEC THK2 15X12 ZIP CLS (MISCELLANEOUS) ×1
BAG ZIPLOCK 12X15 (MISCELLANEOUS) ×3 IMPLANT
BLADE SAW SGTL 11.0X1.19X90.0M (BLADE) IMPLANT
BLADE SAW SGTL 18X1.27X75 (BLADE) ×1 IMPLANT
BLADE SAW SGTL 18X1.27X75MM (BLADE)
BRUSH FEMORAL CANAL (MISCELLANEOUS) IMPLANT
COVER SURGICAL LIGHT HANDLE (MISCELLANEOUS) ×3 IMPLANT
COVER WAND RF STERILE (DRAPES) IMPLANT
CUP ACET PNNCL SECTR W/GRIP 56 (Hips) IMPLANT
DERMABOND ADVANCED (GAUZE/BANDAGES/DRESSINGS) ×2
DERMABOND ADVANCED .7 DNX12 (GAUZE/BANDAGES/DRESSINGS) ×1 IMPLANT
DRAPE ORTHO SPLIT 77X108 STRL (DRAPES) ×6
DRAPE POUCH INSTRU U-SHP 10X18 (DRAPES) ×3 IMPLANT
DRAPE SURG 17X11 SM STRL (DRAPES) ×3 IMPLANT
DRAPE SURG ORHT 6 SPLT 77X108 (DRAPES) ×2 IMPLANT
DRAPE U-SHAPE 47X51 STRL (DRAPES) ×3 IMPLANT
DRESSING AQUACEL AG SP 3.5X10 (GAUZE/BANDAGES/DRESSINGS) IMPLANT
DRSG AQUACEL AG ADV 3.5X10 (GAUZE/BANDAGES/DRESSINGS) IMPLANT
DRSG AQUACEL AG ADV 3.5X14 (GAUZE/BANDAGES/DRESSINGS) ×2 IMPLANT
DRSG AQUACEL AG SP 3.5X10 (GAUZE/BANDAGES/DRESSINGS)
DURAPREP 26ML APPLICATOR (WOUND CARE) ×3 IMPLANT
ELECT BLADE TIP CTD 4 INCH (ELECTRODE) ×3 IMPLANT
ELECT REM PT RETURN 15FT ADLT (MISCELLANEOUS) ×3 IMPLANT
ELIMINATOR HOLE APEX DEPUY (Hips) IMPLANT
FACESHIELD WRAPAROUND (MASK) ×15 IMPLANT
FACESHIELD WRAPAROUND OR TEAM (MASK) ×4 IMPLANT
GLOVE BIOGEL M 7.0 STRL (GLOVE) IMPLANT
GLOVE BIOGEL PI IND STRL 7.5 (GLOVE) ×1 IMPLANT
GLOVE BIOGEL PI IND STRL 8.5 (GLOVE) ×1 IMPLANT
GLOVE BIOGEL PI INDICATOR 7.5 (GLOVE) ×2
GLOVE BIOGEL PI INDICATOR 8.5 (GLOVE) ×2
GLOVE ECLIPSE 8.0 STRL XLNG CF (GLOVE) ×6 IMPLANT
GOWN STRL REUS W/TWL 2XL LVL3 (GOWN DISPOSABLE) ×3 IMPLANT
GOWN STRL REUS W/TWL LRG LVL3 (GOWN DISPOSABLE) ×3 IMPLANT
HANDPIECE INTERPULSE COAX TIP (DISPOSABLE) ×3
HEAD CERAMIC BIO DELTA 36 (Hips) ×2 IMPLANT
KIT TURNOVER KIT A (KITS) IMPLANT
LINER ACET ALTRX +4 32X56 10D (Hips) ×2 IMPLANT
MANIFOLD NEPTUNE II (INSTRUMENTS) ×3 IMPLANT
MARKER SKIN DUAL TIP RULER LAB (MISCELLANEOUS) ×3 IMPLANT
NDL SAFETY ECLIPSE 18X1.5 (NEEDLE) ×1 IMPLANT
NEEDLE HYPO 18GX1.5 SHARP (NEEDLE)
NS IRRIG 1000ML POUR BTL (IV SOLUTION) ×6 IMPLANT
PACK TOTAL JOINT (CUSTOM PROCEDURE TRAY) ×3 IMPLANT
PADDING CAST COTTON 6X4 STRL (CAST SUPPLIES) ×1 IMPLANT
PENCIL SMOKE EVACUATOR (MISCELLANEOUS) IMPLANT
PINN SECTOR W/GRIP ACE CUP 56 (Hips) ×3 IMPLANT
PRESSURIZER FEMORAL UNIV (MISCELLANEOUS) IMPLANT
PROTECTOR NERVE ULNAR (MISCELLANEOUS) ×3 IMPLANT
SCREW 6.5MMX30MM (Screw) ×2 IMPLANT
SET HNDPC FAN SPRY TIP SCT (DISPOSABLE) IMPLANT
SUCTION FRAZIER HANDLE 10FR (MISCELLANEOUS) ×3
SUCTION TUBE FRAZIER 10FR DISP (MISCELLANEOUS) ×1 IMPLANT
SUT STRATAFIX PDS+ 0 24IN (SUTURE) ×3 IMPLANT
SUT VIC AB 1 CT1 36 (SUTURE) ×3 IMPLANT
SUT VIC AB 2-0 CT1 27 (SUTURE) ×9
SUT VIC AB 2-0 CT1 TAPERPNT 27 (SUTURE) ×3 IMPLANT
TOWEL OR 17X26 10 PK STRL BLUE (TOWEL DISPOSABLE) ×6 IMPLANT
TOWER CARTRIDGE SMART MIX (DISPOSABLE) IMPLANT
TRAY FOLEY MTR SLVR 16FR STAT (SET/KITS/TRAYS/PACK) ×3 IMPLANT
WATER STERILE IRR 1000ML POUR (IV SOLUTION) ×5 IMPLANT
YANKAUER SUCT BULB TIP 10FT TU (MISCELLANEOUS) ×3 IMPLANT

## 2020-03-15 NOTE — Interval H&P Note (Signed)
History and Physical Interval Note:  03/15/2020 8:44 AM  Lee Mclaughlin  has presented today for surgery, with the diagnosis of Right total hip instability.  The various methods of treatment have been discussed with the patient and family. After consideration of risks, benefits and other options for treatment, the patient has consented to  Procedure(s) with comments: TOTAL HIP REVISION, posterior approach (Right) - 90 mins posterior approach as a surgical intervention.  The patient's history has been reviewed, patient examined, no change in status, stable for surgery.  I have reviewed the patient's chart and labs.  Questions were answered to the patient's satisfaction.     [name]

## 2020-03-15 NOTE — Anesthesia Preprocedure Evaluation (Addendum)
Anesthesia Evaluation  Patient identified by MRN, date of birth, ID band Patient awake    Reviewed: Allergy & Precautions, NPO status , Patient's Chart, lab work & pertinent test results, reviewed documented beta blocker date and time   History of Anesthesia Complications (+) PONV and history of anesthetic complications  Airway Mallampati: II  TM Distance: >3 FB Neck ROM: Full    Dental no notable dental hx. (+) Dental Advisory Given   Pulmonary Current Smoker, former smoker,    Pulmonary exam normal breath sounds clear to auscultation       Cardiovascular + CAD and + CABG  Normal cardiovascular exam+ dysrhythmias (s/p ablation 2020) Atrial Fibrillation  Rhythm:Regular Rate:Normal  Pt cleared by cardiology 02/23/2020.  Per Corine Shelter, PA-C, "Chart reviewed and patient seen in the office today by Dr Rennis Golden and myself as part of pre-operative protocol coverage. Given past medical history and time since last visit, based on ACC/AHA guidelines, XAINE SANSOM would be at acceptable risk for the planned procedure without further cardiovascular testing.   IMPRESSIONS    1. Left ventricular ejection fraction, by estimation, is 55 to 60%. The  left ventricle has normal function. The left ventricle has no regional  wall motion abnormalities. Left ventricular diastolic parameters are  indeterminate.  2. Right ventricular systolic function is normal. The right ventricular  size is normal.  3. The mitral valve is abnormal. Mild mitral valve regurgitation.  4. The aortic valve is abnormal. Aortic valve regurgitation is not  visualized. Mild to moderate aortic valve sclerosis/calcification is  present, without any evidence of aortic stenosis.  5. The inferior vena cava is normal in size with greater than 50%  respiratory variability, suggesting right atrial pressure of 3 mmHg.    Neuro/Psych negative neurological ROS  negative psych ROS    GI/Hepatic negative GI ROS, Neg liver ROS,   Endo/Other  negative endocrine ROS  Renal/GU negative Renal ROS  negative genitourinary   Musculoskeletal negative musculoskeletal ROS (+)   Abdominal   Peds negative pediatric ROS (+)  Hematology negative hematology ROS (+)   Anesthesia Other Findings   Reproductive/Obstetrics negative OB ROS                            Anesthesia Physical  Anesthesia Plan  ASA: III  Anesthesia Plan: Spinal   Post-op Pain Management:    Induction: Intravenous  PONV Risk Score and Plan: 2 and Ondansetron, Treatment may vary due to age or medical condition and Propofol infusion  Airway Management Planned: Simple Face Mask  Additional Equipment:   Intra-op Plan:   Post-operative Plan:   Informed Consent: I have reviewed the patients History and Physical, chart, labs and discussed the procedure including the risks, benefits and alternatives for the proposed anesthesia with the patient or authorized representative who has indicated his/her understanding and acceptance.     Dental advisory given  Plan Discussed with: CRNA and Anesthesiologist  Anesthesia Plan Comments:        Anesthesia Quick Evaluation

## 2020-03-15 NOTE — Discharge Instructions (Addendum)
INSTRUCTIONS AFTER JOINT REPLACEMENT   o Remove items at home which could result in a fall. This includes throw rugs or furniture in walking pathways o ICE to the affected joint every three hours while awake for 30 minutes at a time, for at least the first 3-5 days, and then as needed for pain and swelling.  Continue to use ice for pain and swelling. You may notice swelling that will progress down to the foot and ankle.  This is normal after surgery.  Elevate your leg when you are not up walking on it.   o Continue to use the breathing machine you got in the hospital (incentive spirometer) which will help keep your temperature down.  It is common for your temperature to cycle up and down following surgery, especially at night when you are not up moving around and exerting yourself.  The breathing machine keeps your lungs expanded and your temperature down.   DIET:  As you were doing prior to hospitalization, we recommend a well-balanced diet.  DRESSING / WOUND CARE / SHOWERING  Keep the surgical dressing until follow up.  The dressing is water proof, so you can shower without any extra covering.  IF THE DRESSING FALLS OFF or the wound gets wet inside, change the dressing with sterile gauze.  Please use good hand washing techniques before changing the dressing.  Do not use any lotions or creams on the incision until instructed by your surgeon.    ACTIVITY  o Increase activity slowly as tolerated, but follow the weight bearing instructions below.   o No driving for 6 weeks or until further direction given by your physician.  You cannot drive while taking narcotics.  o No lifting or carrying greater than 10 lbs. until further directed by your surgeon. o Avoid periods of inactivity such as sitting longer than an hour when not asleep. This helps prevent blood clots.  o You may return to work once you are authorized by your doctor.     WEIGHT BEARING   Partial weight bearing with assist device as  directed.  50% right leg   EXERCISES  Results after joint replacement surgery are often greatly improved when you follow the exercise, range of motion and muscle strengthening exercises prescribed by your doctor. Safety measures are also important to protect the joint from further injury. Any time any of these exercises cause you to have increased pain or swelling, decrease what you are doing until you are comfortable again and then slowly increase them. If you have problems or questions, call your caregiver or physical therapist for advice.   Rehabilitation is important following a joint replacement. After just a few days of immobilization, the muscles of the leg can become weakened and shrink (atrophy).  These exercises are designed to build up the tone and strength of the thigh and leg muscles and to improve motion. Often times heat used for twenty to thirty minutes before working out will loosen up your tissues and help with improving the range of motion but do not use heat for the first two weeks following surgery (sometimes heat can increase post-operative swelling).   These exercises can be done on a training (exercise) mat, on the floor, on a table or on a bed. Use whatever works the best and is most comfortable for you.    Use music or television while you are exercising so that the exercises are a pleasant break in your day. This will make your life better with   the exercises acting as a break in your routine that you can look forward to.   Perform all exercises about fifteen times, three times per day or as directed.  You should exercise both the operative leg and the other leg as well.  Exercises include:   . Quad Sets - Tighten up the muscle on the front of the thigh (Quad) and hold for 5-10 seconds.   . Straight Leg Raises - With your knee straight (if you were given a brace, keep it on), lift the leg to 60 degrees, hold for 3 seconds, and slowly lower the leg.  Perform this exercise  against resistance later as your leg gets stronger.  . Leg Slides: Lying on your back, slowly slide your foot toward your buttocks, bending your knee up off the floor (only go as far as is comfortable). Then slowly slide your foot back down until your leg is flat on the floor again.  . Angel Wings: Lying on your back spread your legs to the side as far apart as you can without causing discomfort.  . Hamstring Strength:  Lying on your back, push your heel against the floor with your leg straight by tightening up the muscles of your buttocks.  Repeat, but this time bend your knee to a comfortable angle, and push your heel against the floor.  You may put a pillow under the heel to make it more comfortable if necessary.   A rehabilitation program following joint replacement surgery can speed recovery and prevent re-injury in the future due to weakened muscles. Contact your doctor or a physical therapist for more information on knee rehabilitation.    CONSTIPATION  Constipation is defined medically as fewer than three stools per week and severe constipation as less than one stool per week.  Even if you have a regular bowel pattern at home, your normal regimen is likely to be disrupted due to multiple reasons following surgery.  Combination of anesthesia, postoperative narcotics, change in appetite and fluid intake all can affect your bowels.   YOU MUST use at least one of the following options; they are listed in order of increasing strength to get the job done.  They are all available over the counter, and you may need to use some, POSSIBLY even all of these options:    Drink plenty of fluids (prune juice may be helpful) and high fiber foods Colace 100 mg by mouth twice a day  Senokot for constipation as directed and as needed Dulcolax (bisacodyl), take with full glass of water  Miralax (polyethylene glycol) once or twice a day as needed.  If you have tried all these things and are unable to have a  bowel movement in the first 3-4 days after surgery call either your surgeon or your primary doctor.    If you experience loose stools or diarrhea, hold the medications until you stool forms back up.  If your symptoms do not get better within 1 week or if they get worse, check with your doctor.  If you experience "the worst abdominal pain ever" or develop nausea or vomiting, please contact the office immediately for further recommendations for treatment.   ITCHING:  If you experience itching with your medications, try taking only a single pain pill, or even half a pain pill at a time.  You can also use Benadryl over the counter for itching or also to help with sleep.   TED HOSE STOCKINGS:  Use stockings on both legs until for   at least 2 weeks or as directed by physician office. They may be removed at night for sleeping.  MEDICATIONS:  See your medication summary on the "After Visit Summary" that nursing will review with you.  You may have some home medications which will be placed on hold until you complete the course of blood thinner medication.  It is important for you to complete the blood thinner medication as prescribed.  PRECAUTIONS:  If you experience chest pain or shortness of breath - call 911 immediately for transfer to the hospital emergency department.   If you develop a fever greater that 101 F, purulent drainage from wound, increased redness or drainage from wound, foul odor from the wound/dressing, or calf pain - CONTACT YOUR SURGEON.                                                   FOLLOW-UP APPOINTMENTS:  If you do not already have a post-op appointment, please call the office for an appointment to be seen by your surgeon.  Guidelines for how soon to be seen are listed in your "After Visit Summary", but are typically between 1-4 weeks after surgery.  OTHER INSTRUCTIONS:   Knee Replacement:  Do not place pillow under knee, focus on keeping the knee straight while resting.     DENTAL ANTIBIOTICS:  In most cases prophylactic antibiotics for Dental procdeures after total joint surgery are not necessary.  Exceptions are as follows:  1. History of prior total joint infection  2. Severely immunocompromised (Organ Transplant, cancer chemotherapy, Rheumatoid biologic meds such as Humera)  3. Poorly controlled diabetes (A1C &gt; 8.0, blood glucose over 200)  If you have one of these conditions, contact your surgeon for an antibiotic prescription, prior to your dental procedure.   MAKE SURE YOU:  . Understand these instructions.  . Get help right away if you are not doing well or get worse.    Thank you for letting us be a part of your medical care team.  It is a privilege we respect greatly.  We hope these instructions will help you stay on track for a fast and full recovery!

## 2020-03-15 NOTE — Plan of Care (Signed)

## 2020-03-15 NOTE — Op Note (Signed)
NAME: Lee Mclaughlin, Lee A. MEDICAL RECORD CN:[id number] ACCOUNT [id number] DATE OF BIRTH:Dec 02, 1951 FACILITY: [facility] LOCATION: [facility] PHYSICIAN:Benz Vandenberghe D. 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  PROCEDURE:  Revision of right total hip arthroplasty.  COMPONENTS USED:  A DePuy size 56 mm Gription Pinnacle shell, 36+4 10-degree face-changing liner and a 36+12 delta ceramic ball.  SURGEON:  Paralee Cancel, MD  ASSISTANT:  Danae Orleans, PA-C.  Note that [name] was present for the entirety of the case from preoperative positioning, perioperative management of the operative extremity, general facilitation of the case and primary wound closure.  ANESTHESIA:  Spinal.  SPECIMENS:  None.  COMPLICATIONS:  None.  BLOOD LOSS:  350 mL.  DRAINS:  None.  INDICATIONS:  The patient is a 69 year old male with history of right index total hip arthroplasty in June 2020.  He has had early and recurrent dislocation of his right hip.  He was seen in consultation for management of this.  In the office, we  reviewed radiographs.  There was concern regarding not enough forward flexion within the acetabular shell.  He wished to proceed with surgical intervention based on the concern of it popping out and the pain associated with this.  The risks of recurrent  dislocation, the risk of infection, DVT, and need for future surgeries were reviewed preoperatively.  Consent was obtained for benefit of pain relief and stability.  DESCRIPTION OF PROCEDURE:  The patient was brought to the operative theater.  Once adequate anesthesia, preoperative antibiotics, Ancef administered, as well as tranexamic acid and Decadron, he was positioned into the left lateral decubitus position with  the right  hip up.  The right lower extremity was then prepped and draped in sterile fashion.  Timeout was performed, identifying the patient, the planned procedure and extremity.  His old incision was utilized.  Soft tissue planes created.  A posterior  approach to the hip was created through the gluteal fascia and iliotibial band.  The posterior aspect of the hip was exposed.  Clear synovial fluid was encountered.  No signs of infection.  With the posterior aspect of the hip exposed and scar  debridement, I was able to evaluate the posterior aspect of the cup.   There was not a significant amount of anteversion of the cup.  The hip was able to be dislocated easily and the femoral head was removed.  We had attempted to create an area on the  ilium for the trunnion to be placed.  The femur was forward flexed as much as possible and retractors placed.  I was able to remove the old polyethylene liner and subsequently the screw within the acetabular shell.  I then placed a trial liner and used  the Innomed cup explant system to remove the previously placed shell.  The shell was removed, albeit with some challenge based on the nature of his soft tissues, scarring, as well as the femur and the femoral component.  The component was removed,  however, without significant bone loss.  I irrigated the base of the acetabulum and then began reaming with a 53 mm reamer.  Preoperatively, I identified that I was able to medialize to provide adequate coverage.  I then reamed up to 55 mm and selected a  56 Pinnacle shell.  The [place] shell was then impacted.  I initially placed the cup in what I felt was in a forward flexion.  I placed a trial neutral liner at this point.  Initial trial reduction with a +5 ball revealed there was still  impingement with hip flexion, as well as hip flexion, internal rotation only to about 30 degrees.  For that reason, I trialed with a 10-degree liner.  When I did this trial, there was still  evidence of subluxation and the combined anteversion still was  under 40 degrees.  For that reason, I removed these trial components, repositioned the femur and then backed the acetabular component out a little bit and then impacted it with more forward flexion.  I also took the opportunity to remove osteophytes over  the anterior rim of the acetabulum, as well as further scarring.  A single cancellous screw was placed into the ilium.  I did select at this point, the 36+4 10-degree face-changing liner and impacted this with the 10-degree portion positioned at around  the 9 o'clock position for this right hip.  At this point, the forward flexion of the acetabular shell appeared to be at least 30 degrees, if not a bit more.  With the final acetabular component and liner in place, I retrialed and selected the +12 ball.   This provided what appeared to be restoration of his leg lengths, as well as no evidence of impingement with forward flexion and internal rotation to about 40 degrees where he was tight.  There was no evidence of impingement with extension, external  rotation either.  Given these findings, the final 36+12 delta ceramic ball was then impacted on a clean and dried trunnion.  The hip was reduced.  The hip had been irrigated throughout the case and again at this point.  Given the scar debridement and the  posterior capsular excision for exposure purposes, there was no soft tissues posteriorly to reapproximate.  I instead then reapproximated the iliotibial band and gluteal fascia using a combination of #1 Vicryl and #1 Stratafix suture.  The remainder of  the wound was closed with 2-0 Vicryl and a running Monocryl stitch.  The hip was clean, dry and dressed sterilely using surgical glue and Aquacel dressing.  He was brought to the recovery room in stable condition, tolerating the procedure well.  Findings  were reviewed with his wife.  I will have him use a walker and be partial to full  weightbearing, with posterior hip precautions postoperatively.  VN/NUANCE  D:03/15/2020 T:03/15/2020 JOB:[id number]

## 2020-03-15 NOTE — Transfer of Care (Signed)
Immediate Anesthesia Transfer of Care Note  Patient: Lee Mclaughlin  Procedure(s) Performed: TOTAL HIP REVISION, posterior approach (Right Hip)  Patient Location: PACU  Anesthesia Type:Spinal  Level of Consciousness: oriented and drowsy  Airway & Oxygen Therapy: Patient Spontanous Breathing and Patient connected to face mask oxygen  Post-op Assessment: Report given to RN and Post -op Vital signs reviewed and stable  Post vital signs: Reviewed and stable  Last Vitals:  Vitals Value Taken Time  BP 100/60 03/15/20 1330  Temp    Pulse 67 03/15/20 1331  Resp 23 03/15/20 1331  SpO2 100 % 03/15/20 1331  Vitals shown include unvalidated device data.  Last Pain:  Vitals:   03/15/20 0815  TempSrc:   PainSc: 3       Patients Stated Pain Goal: 0 (03/15/20 0815)  Complications: No apparent anesthesia complications

## 2020-03-15 NOTE — Anesthesia Postprocedure Evaluation (Signed)
Anesthesia Post Note  Patient: Lee Mclaughlin  Procedure(s) Performed: TOTAL HIP REVISION, posterior approach (Right Hip)     Patient location during evaluation: PACU Anesthesia Type: Spinal Level of consciousness: awake and alert Pain management: pain level controlled Vital Signs Assessment: post-procedure vital signs reviewed and stable Respiratory status: spontaneous breathing and respiratory function stable Cardiovascular status: blood pressure returned to baseline and stable Postop Assessment: spinal receding Anesthetic complications: no    Last Vitals:  Vitals:   03/15/20 1410 03/15/20 1415  BP: 105/65 98/70  Pulse: 72 64  Resp: 20 15  Temp:  (!) 36.3 C  SpO2: 99% 97%    Last Pain:  Vitals:   03/15/20 1415  TempSrc:   PainSc: 0-No pain                 Ella Guillotte DANIEL

## 2020-03-15 NOTE — Evaluation (Signed)
Physical Therapy Evaluation Patient Details Name: Lee Mclaughlin MRN: [id number] DOB: Jun 02, 1951 Today's Date: 03/15/2020   History of Present Illness  Patient is 69 y.o. male s/p Rt THR (revision) posterior approach after multiple disolcations since initial THA on 05/24/19. Patient has PMH significant for CABG in 2000, history of MI, GERD, CODVI-19 in 11/2019, CAD, Rt THA on 05/24/19, Rt TKA on 07/23/15.    Clinical Impression  Lee Mclaughlin is a 69 y.o. male POD 0 s/p Rt THR. Patient reports independence with mobility at baseline however has been using crutches in last few days due to pain and hip dislocations. Patient is now limited by functional impairments (see PT problem list below) and requires min assist for transfers and gait with RW. Patient was able to ambulate ~25 feet with RW and min assist. Patient instructed in exercise to facilitate circulation and posterior hip precautions as well as PWB status reviewed. Patient will benefit from continued skilled PT interventions to address impairments and progress towards PLOF. Acute PT will follow to progress mobility and stair training in preparation for safe discharge home.     Follow Up Recommendations Follow surgeon's recommendation for DC plan and follow-up therapies;Home health PT    Equipment Recommendations  Rolling walker with 5" wheels    Recommendations for Other Services       Precautions / Restrictions Precautions Precautions: Fall;Posterior Hip Precaution Booklet Issued: Yes (comment) Restrictions Weight Bearing Restrictions: Yes RLE Weight Bearing: Partial weight bearing RLE Partial Weight Bearing Percentage or Pounds: 50%      Mobility  Bed Mobility Overal bed mobility: Needs Assistance Bed Mobility: Supine to Sit     Supine to sit: Min assist;HOB elevated     General bed mobility comments: cues for posterior hip precautions and assist to raise trunk up and move Rt LE.   Transfers Overall transfer level: Needs  assistance Equipment used: Rolling walker (2 wheeled) Transfers: Sit to/from Stand Sit to Stand: Min assist;From elevated surface         General transfer comment: cues for hand placement/technique and to extend Rt LE to maintain posterior hip precautions.   Ambulation/Gait Ambulation/Gait assistance: Min assist Gait Distance (Feet): 25 Feet Assistive device: Rolling walker (2 wheeled) Gait Pattern/deviations: Step-to pattern;Decreased stride length;Decreased stance time - right;Decreased weight shift to right Gait velocity: decreased   General Gait Details: verbal cues for PWB status and to maintain safe proximity to RW. no overt LOB noted.   Stairs            Wheelchair Mobility    Modified Rankin (Stroke Patients Only)       Balance Overall balance assessment: Needs assistance Sitting-balance support: Feet supported Sitting balance-Leahy Scale: Good     Standing balance support: During functional activity;Bilateral upper extremity supported Standing balance-Leahy Scale: Poor          Pertinent Vitals/Pain Pain Assessment: Faces Faces Pain Scale: Hurts even more Pain Location: Rt hip Pain Descriptors / Indicators: Discomfort;Guarding;Grimacing Pain Intervention(s): Limited activity within patient's tolerance;Monitored during session;Repositioned;Premedicated before session    Home Living Family/patient expects to be discharged to:: Private residence Living Arrangements: Spouse/significant other(available through the weekend) Available Help at Discharge: Family;Available 24 hours/day Type of Home: House Home Access: Level entry     Home Layout: Two level;Able to live on main level with bedroom/bathroom;Full bath on main level Home Equipment: Crutches;Shower seat - built in      Prior Function Level of Independence: Independent with assistive device(s)  Comments: pt has been using crutches due to pain and most recent hip dislocation several  days ago     Hand Dominance        Extremity/Trunk Assessment   Upper Extremity Assessment Upper Extremity Assessment: Overall WFL for tasks assessed    Lower Extremity Assessment Lower Extremity Assessment: Overall WFL for tasks assessed    Cervical / Trunk Assessment Cervical / Trunk Assessment: Normal  Communication   Communication: No difficulties  Cognition Arousal/Alertness: Awake/alert Behavior During Therapy: WFL for tasks assessed/performed Overall Cognitive Status: Within Functional Limits for tasks assessed           General Comments      Exercises Total Joint Exercises Ankle Circles/Pumps: AROM;Both;15 reps;Seated   Assessment/Plan    PT Assessment Patient needs continued PT services  PT Problem List Decreased strength;Decreased range of motion;Decreased activity tolerance;Decreased balance;Decreased mobility;Decreased knowledge of use of DME;Decreased knowledge of precautions       PT Treatment Interventions DME instruction;Gait training;Stair training;Functional mobility training;Therapeutic activities;Therapeutic exercise;Balance training;Patient/family education    PT Goals (Current goals can be found in the Care Plan section)  Acute Rehab PT Goals Patient Stated Goal: to get back to golf PT Goal Formulation: With patient Time For Goal Achievement: 03/22/20 Potential to Achieve Goals: Good    Frequency 7X/week    AM-PAC PT "6 Clicks" Mobility  Outcome Measure Help needed turning from your back to your side while in a flat bed without using bedrails?: A Little Help needed moving from lying on your back to sitting on the side of a flat bed without using bedrails?: A Little Help needed moving to and from a bed to a chair (including a wheelchair)?: A Little Help needed standing up from a chair using your arms (e.g., wheelchair or bedside chair)?: A Little Help needed to walk in hospital room?: A Little Help needed climbing 3-5 steps with a  railing? : A Lot 6 Click Score: 17    End of Session Equipment Utilized During Treatment: Gait belt Activity Tolerance: Patient tolerated treatment well Patient left: in chair;with call bell/phone within reach;with chair alarm set;with family/visitor present Nurse Communication: Mobility status PT Visit Diagnosis: Muscle weakness (generalized) (M62.81);Unsteadiness on feet (R26.81);Difficulty in walking, not elsewhere classified (R26.2)    Time: 4403-4742 PT Time Calculation (min) (ACUTE ONLY): 34 min   Charges:   PT Evaluation $PT Eval Moderate Complexity: 1 Mod PT Treatments $Gait Training: 8-22 mins       Verner Mould, DPT Physical Therapist with [facility] [phone]  03/15/2020 7:59 PM

## 2020-03-15 NOTE — Brief Op Note (Signed)
03/15/2020  10:41 AM  PATIENT:  Lee Mclaughlin  69 y.o. male  PRE-OPERATIVE DIAGNOSIS:  Failed right total hip replacement due to recurrent dislocations/instability  POST-OPERATIVE DIAGNOSIS:  Failed right total hip replacement due to recurrent dislocations/ instability  PROCEDURE:  Procedure(s) with comments: TOTAL HIP REVISION, posterior approach (Right) - 90 mins posterior approach  SURGEON:  Surgeon(s) and Role:    Durene Romans, MD - Primary  PHYSICIAN ASSISTANT: Lanney Gins, PA-C  ANESTHESIA:   spinal  EBL:  350 cc  BLOOD ADMINISTERED:none  DRAINS: none   LOCAL MEDICATIONS USED:  NONE  SPECIMEN:  No Specimen  DISPOSITION OF SPECIMEN:  N/A  COUNTS:  YES  TOURNIQUET:  * No tourniquets in log *  DICTATION: .Other Dictation: Dictation Number [id number]  PLAN OF CARE: Admit for overnight observation  PATIENT DISPOSITION:  PACU - hemodynamically stable.   Delay start of Pharmacological VTE agent (>24hrs) due to surgical blood loss or risk of bleeding: no

## 2020-03-16 ENCOUNTER — Encounter: Payer: Self-pay | Admitting: *Deleted

## 2020-03-16 DIAGNOSIS — M25551 Pain in right hip: Secondary | ICD-10-CM | POA: Diagnosis not present

## 2020-03-16 DIAGNOSIS — T84090A Other mechanical complication of internal right hip prosthesis, initial encounter: Secondary | ICD-10-CM | POA: Diagnosis not present

## 2020-03-16 DIAGNOSIS — E669 Obesity, unspecified: Secondary | ICD-10-CM | POA: Diagnosis present

## 2020-03-16 LAB — BASIC METABOLIC PANEL
Anion gap: 8 (ref 5–15)
BUN: 26 mg/dL — ABNORMAL HIGH (ref 8–23)
CO2: 26 mmol/L (ref 22–32)
Calcium: 8.6 mg/dL — ABNORMAL LOW (ref 8.9–10.3)
Chloride: 102 mmol/L (ref 98–111)
Creatinine, Ser: 1.04 mg/dL (ref 0.61–1.24)
GFR calc Af Amer: >60 mL/min (ref >60)
GFR calc non Af Amer: >60 mL/min (ref >60)
Glucose, Bld: 106 mg/dL — ABNORMAL HIGH (ref 70–99)
Potassium: 4.6 mmol/L (ref 3.5–5.1)
Sodium: 136 mmol/L (ref 135–145)

## 2020-03-16 LAB — CBC
HCT: 40.2 % (ref 39.0–52.0)
Hemoglobin: 13.2 g/dL (ref 13.0–17.0)
MCH: 30.5 pg (ref 26.0–34.0)
MCHC: 32.8 g/dL (ref 30.0–36.0)
MCV: 92.8 fL (ref 80.0–100.0)
Platelets: 121 10*3/uL — ABNORMAL LOW (ref 150–400)
RBC: 4.33 MIL/uL (ref 4.22–5.81)
RDW: 13 % (ref 11.5–15.5)
WBC: 7.5 10*3/uL (ref 4.0–10.5)
nRBC: 0 % (ref 0.0–0.2)

## 2020-03-16 NOTE — TOC Transition Note (Signed)
Transition of Care [facility]) - CM/SW Discharge Note   Patient Details  Name: DWAYNE BULKLEY MRN: [id number] Date of Birth: May 15, 1951  [facility]) CM/SW Contact:  Amada Jupiter, LCSW Phone Number: 03/16/2020, 9:10 AM   Clinical Narrative:   Home with HEP.  RW via [employer].  No further needs.    Final next level of care: Home/Self Care(home with HEP) Barriers to Discharge: Barriers Resolved   Patient Goals and CMS Choice Patient states their goals for this hospitalization and ongoing recovery are:: go home      Discharge Placement                       Discharge Plan and Services                DME Arranged: Walker rolling DME Agency: [employer] Date DME Agency Contacted: 03/16/20 Time DME Agency Contacted: [phone]   HH Arranged: NA HH Agency: NA        Social Determinants of Health (SDOH) Interventions     Readmission Risk Interventions No flowsheet data found.

## 2020-03-16 NOTE — Progress Notes (Signed)
     Subjective: 1 Day Post-Op Procedure(s) (LRB): TOTAL HIP REVISION, posterior approach (Right)   Patient reports pain as mild, pain controlled.  No reported events overnight.  Dr. Charlann Boxer discussed the procedure, findings and expectations moving forward.  Partial weightbearing status to allow the implants to heal.  Patient is ready be discharged home, if he does well with therapy.  Patient follow-up in the clinic in 2 weeks.  Patient is to call with any questions or concerns.     Objective:   VITALS:   Vitals:   03/16/20 0046 03/16/20 0531  BP: 110/62 102/62  Pulse: 75 (!) 56  Resp: 16 16  Temp: 98.5 F (36.9 C) 97.7 F (36.5 C)  SpO2: 94% 96%    Dorsiflexion/Plantar flexion intact Incision: dressing C/D/I No cellulitis present Compartment soft  LABS Recent Labs    03/16/20 0320  HGB 13.2  HCT 40.2  WBC 7.5  PLT 121*    Recent Labs    03/16/20 0320  NA 136  K 4.6  BUN 26*  CREATININE 1.04  GLUCOSE 106*     Assessment/Plan: 1 Day Post-Op Procedure(s) (LRB): TOTAL HIP REVISION, posterior approach (Right) Foley catheter DC'd Advance diet Up with therapy Posterior hip precautions D/C IV fluids Discharge home Follow up in 2 weeks at [facility] Follow up with OLIN,Bailyn Spackman D in 2 weeks.  Contact information:  [facility] [address] [place] [address] [phone]    Obese (BMI 30-39.9) Estimated body mass index is 35.9 kg/m as calculated from the following:   Height as of this encounter: 5\' 8"  (1.727 m).   Weight as of this encounter: 107.1 kg. Patient also counseled that weight may inhibit the healing process Patient counseled that losing weight will help with future health issues        PA-C  [facility]  [place] [address], [address], [place], [place] Phone: [phone] [email or web address] Facebook  [phone]

## 2020-03-16 NOTE — Progress Notes (Signed)
Physical Therapy Treatment Patient Details Name: Lee Mclaughlin MRN: [id number] DOB: [birth year] Today's Date: 03/16/2020    History of Present Illness Patient is 69 y.o. male s/p Rt THR (revision) posterior approach after multiple disolcations since initial THA on 05/24/19. Patient has PMH significant for CABG in 2000, history of MI, GERD, CODVI-19 in 11/2019, CAD, Rt THA on 05/24/19, Rt TKA on 07/23/15.    PT Comments    Pt is progressing well with mobility and is ready for DC home from PT standpoint. He ambulated 150' with RW and demonstrates good understanding of HEP. Pt was able to recall 2 of 3 posterior hip precautions, so precautions were reviewed in detail.    Follow Up Recommendations  Follow surgeon's recommendation for DC plan and follow-up therapies;Home health PT     Equipment Recommendations  Rolling walker with 5" wheels    Recommendations for Other Services       Precautions / Restrictions Precautions Precautions: Fall;Posterior Hip Precaution Booklet Issued: Yes (comment) Precaution Comments: pt able to recall 2 of 3 precautions; reviewed precautions in detail Restrictions Weight Bearing Restrictions: Yes RLE Weight Bearing: Partial weight bearing RLE Partial Weight Bearing Percentage or Pounds: 50%    Mobility  Bed Mobility Overal bed mobility: Modified Independent Bed Mobility: Sidelying to Sit   Sidelying to sit: Modified independent (Device/Increase time)       General bed mobility comments: cues for posterior hip precautions  Transfers Overall transfer level: Needs assistance Equipment used: Rolling walker (2 wheeled) Transfers: Sit to/from Stand Sit to Stand: From elevated surface;Supervision         General transfer comment: cues for hand placement/technique and to extend Rt LE to maintain posterior hip precautions.   Ambulation/Gait Ambulation/Gait assistance: Supervision Gait Distance (Feet): 150 Feet Assistive device: Rolling walker (2  wheeled) Gait Pattern/deviations: Step-to pattern;Decreased stride length;Decreased stance time - right;Decreased weight shift to right Gait velocity: decreased   General Gait Details: verbal cues for PWB status and to maintain safe proximity to RW. no overt LOB noted, VCs for maintaining precautions while turning   Stairs             Wheelchair Mobility    Modified Rankin (Stroke Patients Only)       Balance Overall balance assessment: Needs assistance Sitting-balance support: Feet supported Sitting balance-Leahy Scale: Good     Standing balance support: During functional activity;Bilateral upper extremity supported Standing balance-Leahy Scale: Fair                              Cognition Arousal/Alertness: Awake/alert Behavior During Therapy: WFL for tasks assessed/performed Overall Cognitive Status: Within Functional Limits for tasks assessed                                        Exercises Total Joint Exercises Ankle Circles/Pumps: AROM;Both;15 reps;Seated Short Arc Quad: AROM;Right;10 reps;Supine Heel Slides: AAROM;Right;10 reps;Supine Hip ABduction/ADduction: AAROM;Right;10 reps;Supine Long Arc Quad: AROM;Right;10 reps;Seated    General Comments        Pertinent Vitals/Pain Pain Assessment: 0-10 Pain Score: 3  Pain Location: R hip Pain Descriptors / Indicators: Sore Pain Intervention(s): Limited activity within patient's tolerance;Monitored during session;Premedicated before session;Ice applied    Home Living                      Prior  Function            PT Goals (current goals can now be found in the care plan section) Acute Rehab PT Goals Patient Stated Goal: to get back to golf PT Goal Formulation: With patient/family Time For Goal Achievement: 03/22/20 Potential to Achieve Goals: Good Progress towards PT goals: Goals met/education completed, patient discharged from PT    Frequency     7X/week      PT Plan Current plan remains appropriate    Co-evaluation              AM-PAC PT "6 Clicks" Mobility   Outcome Measure  Help needed turning from your back to your side while in a flat bed without using bedrails?: A Little Help needed moving from lying on your back to sitting on the side of a flat bed without using bedrails?: A Little Help needed moving to and from a bed to a chair (including a wheelchair)?: A Little Help needed standing up from a chair using your arms (e.g., wheelchair or bedside chair)?: A Little Help needed to walk in hospital room?: A Little Help needed climbing 3-5 steps with a railing? : A Little 6 Click Score: 18    End of Session Equipment Utilized During Treatment: Gait belt Activity Tolerance: Patient tolerated treatment well Patient left: in chair;with call bell/phone within reach;with chair alarm set;with family/visitor present Nurse Communication: Mobility status PT Visit Diagnosis: Muscle weakness (generalized) (M62.81);Unsteadiness on feet (R26.81);Difficulty in walking, not elsewhere classified (R26.2)     Time: 2707-8675 PT Time Calculation (min) (ACUTE ONLY): 33 min  Charges:  $Gait Training: 8-22 mins $Therapeutic Exercise: 8-22 mins                     Blondell Reveal Kistler PT 03/16/2020  Acute Rehabilitation Services Pager [phone] Office [phone]

## 2020-03-16 NOTE — Plan of Care (Signed)
Discharge instructions were given to Pt. All questions were answered.  

## 2020-03-21 NOTE — Discharge Summary (Signed)
Physician Discharge Summary  Patient ID: Lee Mclaughlin MRN: [id number] DOB/AGE: 1951/08/11 69 y.o.  Admit date: 03/15/2020 Discharge date: 03/16/2020   Procedures:  Procedure(s) (LRB): TOTAL HIP REVISION, posterior approach (Right)  Attending Physician:  Dr. Paralee Mclaughlin   Admission Diagnoses:   Instability of right total hip arthroplasty  Discharge Diagnoses:  Principal Problem:   S/P right TH revision Active Problems:   Obese  Past Medical History:  Diagnosis Date  . Coronary artery disease   . COVID-19    In december 2020  . Dyslipidemia   . Dysrhythmia    a flutter  . GERD (gastroesophageal reflux disease)   . H/O cardiovascular stress test 10/19/2012   MET test - good effort, peak VO2 of almost 112% predicted, HR 79% predicted, mildly ischemia at end of exercise  . History of hyperthyroidism as a child   . History of nuclear stress test 03/2011   bruce myoview; mild perfusion defect in mid anteroseptal, apical septal, apical regions (infarct/scar/breast attenuation); post-stress EF 55%; no signficant ischemia  . Ischemic cardiomyopathy    history of   . Myocardial infarction ([place])   . Pneumonia    as a kid  . PONV (postoperative nausea and vomiting)   . Primary localized osteoarthritis of right hip 05/24/2019  . Primary localized osteoarthritis of right knee   . Restless leg   . right total hip arthroplasty with dislocation of hip ([place]) 08/14/2019  . S/P CABG (coronary artery bypass graft) 2000  . 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 Wears glasses     HPI:     Lee Mclaughlin, 69 y.o. male, has a history of pain and functional disability in the right hip due to instability and patient has failed non-surgical conservative treatments for greater than 12 weeks to include NSAID's and/or analgesics, supervised PT with diminished ADL's post treatment, use of assistive devices and activity modification. The indications for the revision total hip arthroplasty  are instability / dislocations. Onset of symptoms was abrupt starting  months ago with gradually worsening course since that time.  Prior procedures on the right hip include arthroplasty 3 months ago by Dr. Thea Mclaughlin.  Patient currently rates pain in the right hip at 10 out of 10 with a dislocation.  There is worsening of pain with activity and weight bearing, trendelenberg gait, pain that interfers with activities of daily living and pain with passive range of motion. Patient has evidence of previous THA by imaging studies.  This condition presents safety issues increasing the risk of falls.  There is no current active infection.  Risks, benefits and expectations were discussed with the patient.  Risks including but not limited to the risk of anesthesia, blood clots, nerve damage, blood vessel damage, failure of the prosthesis, infection and up to and including death.  Patient understand the risks, benefits and expectations and wishes to proceed with surgery.   PCP: Lee Sacramento, MD   Discharged Condition: good  Hospital Course:  Patient underwent the above stated procedure on 03/15/2020. Patient tolerated the procedure well and brought to the recovery room in good condition and subsequently to the floor.  POD #1 BP: 102/62 ; Pulse: 56 ; Temp: 97.7 F (36.5 C) ; Resp: 16 Patient reports pain as mild, pain controlled.  No reported events overnight.  Dr. Alvan Mclaughlin discussed the procedure, findings and expectations moving forward.  Partial weightbearing status to allow the implants to heal.  Patient is ready be discharged home.  Dorsiflexion/plantar flexion intact, incision: dressing C/D/I, no cellulitis present and compartment soft.   LABS  Basename    HGB     13.2  HCT     40.2    Discharge Exam: General appearance: alert, cooperative and no distress Extremities: Homans sign is negative, no sign of DVT, no edema, redness or tenderness in the calves or thighs and no ulcers, gangrene or trophic  changes  Disposition: Home with follow up in 2 weeks   Follow-up Information    Lee Cancel, MD. Schedule an appointment as soon as possible for a visit in 2 weeks.   Specialty: Orthopedic Surgery Contact information: [address] [place] [address] [phone]           Discharge Instructions    Call MD / Call 911   Complete by: As directed    If you experience chest pain or shortness of breath, CALL 911 and be transported to the hospital emergency room.  If you develope a fever above 101 F, pus (white drainage) or increased drainage or redness at the wound, or calf pain, call your surgeon's office.   Call MD / Call 911   Complete by: As directed    If you experience chest pain or shortness of breath, CALL 911 and be transported to the hospital emergency room.  If you develope a fever above 101 F, pus (white drainage) or increased drainage or redness at the wound, or calf pain, call your surgeon's office.   Change dressing   Complete by: As directed    Maintain surgical dressing until follow up in the clinic. If the edges start to pull up, may reinforce with tape. If the dressing is no longer working, may remove and cover with gauze and tape, but must keep the area dry and clean.  Call with any questions or concerns.   Change dressing   Complete by: As directed    Maintain surgical dressing until follow up in the clinic. If the edges start to pull up, may reinforce with tape. If the dressing is no longer working, may remove and cover with gauze and tape, but must keep the area dry and clean.  Call with any questions or concerns.   Constipation Prevention   Complete by: As directed    Drink plenty of fluids.  Prune juice may be helpful.  You may use a stool softener, such as Colace (over the counter) 100 mg twice a day.  Use MiraLax (over the counter) for constipation as needed.   Constipation Prevention   Complete by: As directed    Drink plenty of fluids.   Prune juice may be helpful.  You may use a stool softener, such as Colace (over the counter) 100 mg twice a day.  Use MiraLax (over the counter) for constipation as needed.   Diet - low sodium heart healthy   Complete by: As directed    Diet - low sodium heart healthy   Complete by: As directed    Discharge instructions   Complete by: As directed    Maintain surgical dressing until follow up in the clinic. If the edges start to pull up, may reinforce with tape. If the dressing is no longer working, may remove and cover with gauze and tape, but must keep the area dry and clean.  Follow up in 2 weeks at [facility]. Call with any questions or concerns.   Discharge instructions   Complete by: As directed    Maintain  surgical dressing until follow up in the clinic. If the edges start to pull up, may reinforce with tape. If the dressing is no longer working, may remove and cover with gauze and tape, but must keep the area dry and clean.  Follow up in 2 weeks at [facility]. Call with any questions or concerns.   Follow the hip precautions as taught in Physical Therapy   Complete by: As directed    Increase activity slowly as tolerated   Complete by: As directed    Weight bearing as tolerated with assist device (walker, cane, etc) as directed, use it as long as suggested by your surgeon or therapist, typically at least 4-6 weeks.   Partial weight bearing   Complete by: As directed    % Body Weight: 50   Laterality: right   Extremity: Lower   Posterior total hip precautions   Complete by: As directed    TED hose   Complete by: As directed    Use stockings (TED hose) for 2 weeks on both leg(s).  You may remove them at night for sleeping.   TED hose   Complete by: As directed    Use stockings (TED hose) for 2 weeks on both leg(s).  You may remove them at night for sleeping.      Allergies as of 03/16/2020      Reactions   Morphine And Related Nausea And Vomiting   Crestor [rosuvastatin] Other  (See Comments)   Myalgias   Vytorin [ezetimibe-simvastatin] Other (See Comments)   Myalgias      Medication List    TAKE these medications   aspirin 81 MG chewable tablet Commonly known as: Aspirin Childrens Chew 1 tablet (81 mg total) by mouth 2 (two) times daily. Take for 4 weeks, then resume regular dose.   benzonatate 200 MG capsule Commonly known as: TESSALON Take 200 mg by mouth in the morning, at noon, and at bedtime.   carvedilol 12.5 MG tablet Commonly known as: COREG Take 12.5 mg by mouth 2 (two) times daily with a meal.   docusate sodium 100 MG capsule Commonly known as: Colace Take 1 capsule (100 mg total) by mouth 2 (two) times daily.   ferrous sulfate 325 (65 FE) MG tablet Commonly known as: FerrouSul Take 1 tablet (325 mg total) by mouth 3 (three) times daily with meals for 14 days.   HYDROcodone-acetaminophen 7.5-325 MG tablet Commonly known as: Norco Take 1-2 tablets by mouth every 4 (four) hours as needed for moderate pain.   loratadine 10 MG tablet Commonly known as: CLARITIN Take 10 mg by mouth daily as needed for allergies.   methocarbamol 500 MG tablet Commonly known as: Robaxin Take 1 tablet (500 mg total) by mouth every 6 (six) hours as needed for muscle spasms.   olmesartan 40 MG tablet Commonly known as: BENICAR Take 40 mg by mouth every morning.   polyethylene glycol 17 g packet Commonly known as: MIRALAX / GLYCOLAX Take 17 g by mouth 2 (two) times daily.   ProAir HFA 108 (90 Base) MCG/ACT inhaler Generic drug: albuterol Inhale 2 puffs into the lungs every 6 (six) hours as needed for wheezing or shortness of breath.   Repatha SureClick 940 MG/ML Soaj Generic drug: Evolocumab Inject 140 mg into the skin every 14 (fourteen) days.   rOPINIRole 1 MG tablet Commonly known as: REQUIP Take 2-3 mg by mouth 3 (three) times daily as needed (restless legs (max 6 tablets per day)).  Discharge Care Instructions  (From  admission, onward)         Start     Ordered   03/16/20 0000  Change dressing    Comments: Maintain surgical dressing until follow up in the clinic. If the edges start to pull up, may reinforce with tape. If the dressing is no longer working, may remove and cover with gauze and tape, but must keep the area dry and clean.  Call with any questions or concerns.   03/16/20 0746   03/16/20 0000  Partial weight bearing    Question Answer Comment  % Body Weight 50   Laterality right   Extremity Lower      03/16/20 0746   03/15/20 0000  Change dressing    Comments: Maintain surgical dressing until follow up in the clinic. If the edges start to pull up, may reinforce with tape. If the dressing is no longer working, may remove and cover with gauze and tape, but must keep the area dry and clean.  Call with any questions or concerns.   03/15/20 5726           Signed: West Pugh. Godwin Tedesco   PA-C  03/21/2020, 10:10 PM

## 2020-04-25 DIAGNOSIS — Z471 Aftercare following joint replacement surgery: Secondary | ICD-10-CM | POA: Diagnosis not present

## 2020-04-25 DIAGNOSIS — Z96641 Presence of right artificial hip joint: Secondary | ICD-10-CM | POA: Diagnosis not present

## 2020-04-25 DIAGNOSIS — H26491 Other secondary cataract, right eye: Secondary | ICD-10-CM | POA: Diagnosis not present

## 2020-05-03 DIAGNOSIS — H26493 Other secondary cataract, bilateral: Secondary | ICD-10-CM | POA: Diagnosis not present

## 2020-05-03 DIAGNOSIS — Z9889 Other specified postprocedural states: Secondary | ICD-10-CM | POA: Diagnosis not present

## 2020-05-03 DIAGNOSIS — Z961 Presence of intraocular lens: Secondary | ICD-10-CM | POA: Diagnosis not present

## 2020-05-17 DIAGNOSIS — H26492 Other secondary cataract, left eye: Secondary | ICD-10-CM | POA: Diagnosis not present

## 2020-06-01 DIAGNOSIS — H26492 Other secondary cataract, left eye: Secondary | ICD-10-CM | POA: Diagnosis not present

## 2020-06-06 DIAGNOSIS — Z471 Aftercare following joint replacement surgery: Secondary | ICD-10-CM | POA: Diagnosis not present

## 2020-06-06 DIAGNOSIS — Z96642 Presence of left artificial hip joint: Secondary | ICD-10-CM | POA: Diagnosis not present

## 2020-06-15 ENCOUNTER — Telehealth: Payer: Self-pay | Admitting: Internal Medicine

## 2020-06-15 NOTE — Telephone Encounter (Signed)
Patient called to find out who did his ablation.

## 2020-07-13 DIAGNOSIS — L0201 Cutaneous abscess of face: Secondary | ICD-10-CM | POA: Diagnosis not present

## 2020-07-14 DIAGNOSIS — K130 Diseases of lips: Secondary | ICD-10-CM | POA: Diagnosis not present

## 2020-09-01 IMAGING — CR DG HIP (WITH OR WITHOUT PELVIS) 2-3V RIGHT
3 series · 3 of 3 positions shown · non-contrast
Comparison: Right hip radiographs and CT 05/24/2019

CLINICAL DATA: Pt states felt like rt hip popped out when standing
up this a.m. surgery x 2 weeks ago, states feels like popped back in
at this time

EXAM:
DG HIP (WITH OR WITHOUT PELVIS) 2-3V RIGHT

[t pelvis ap]
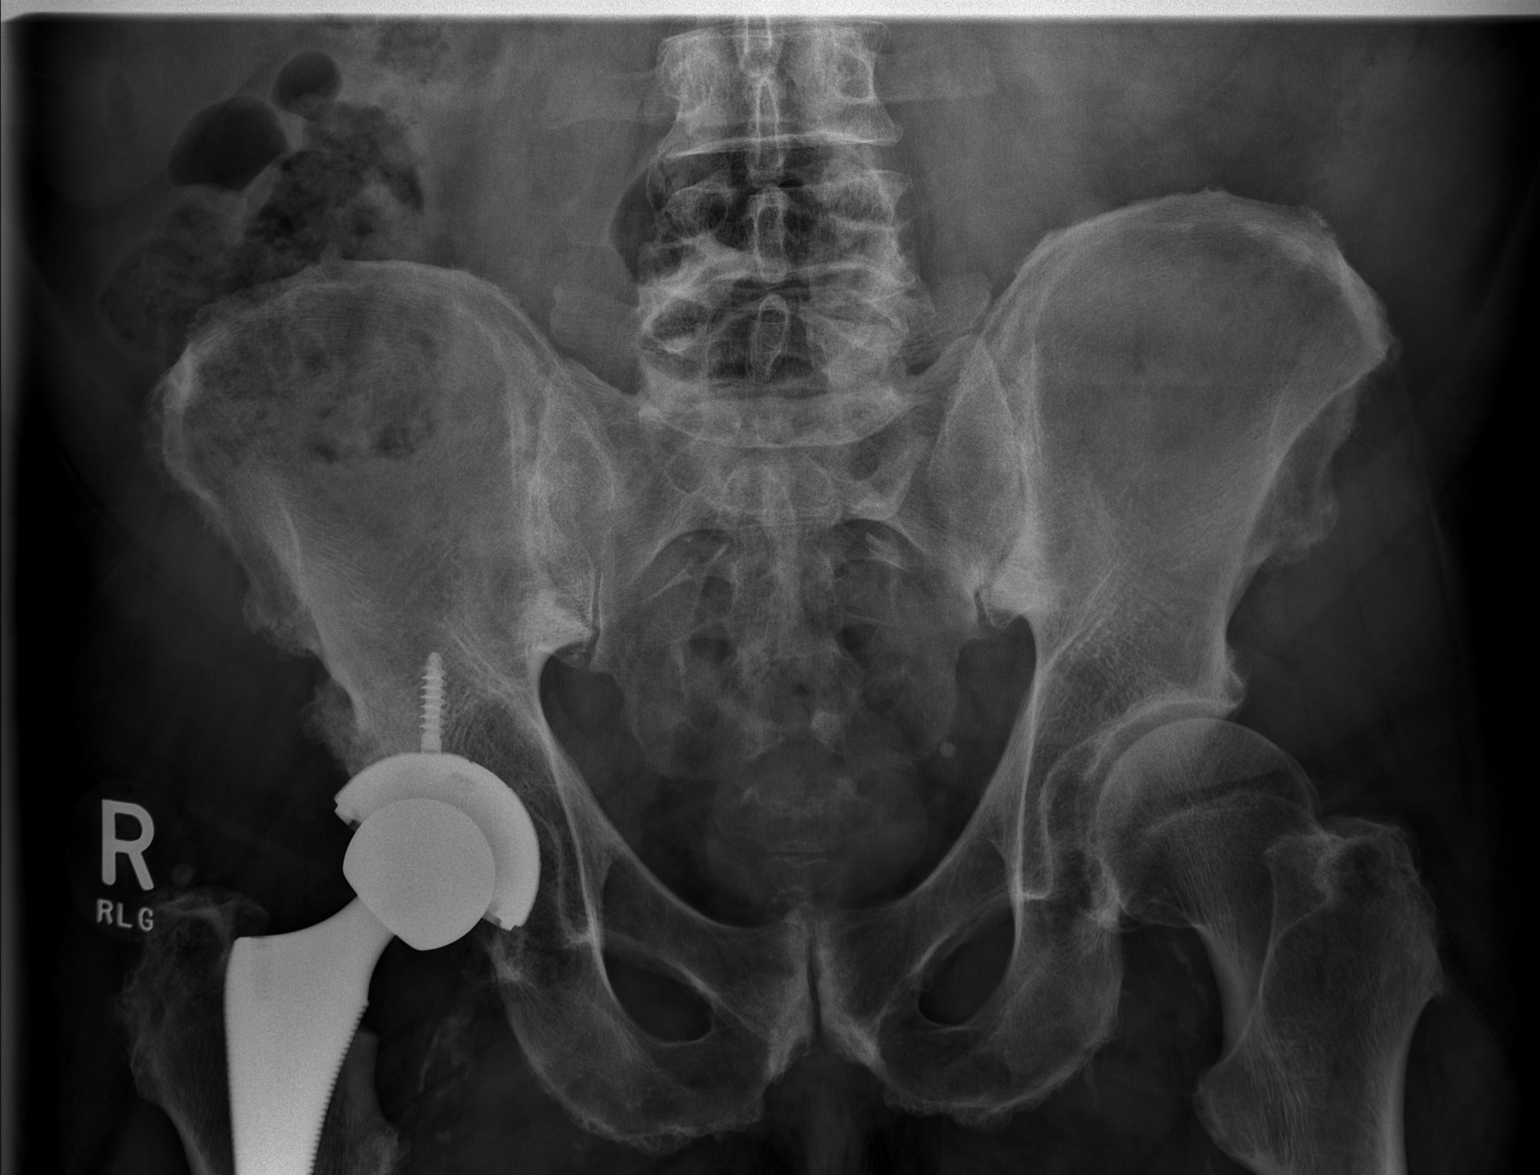

[t hip ap right]
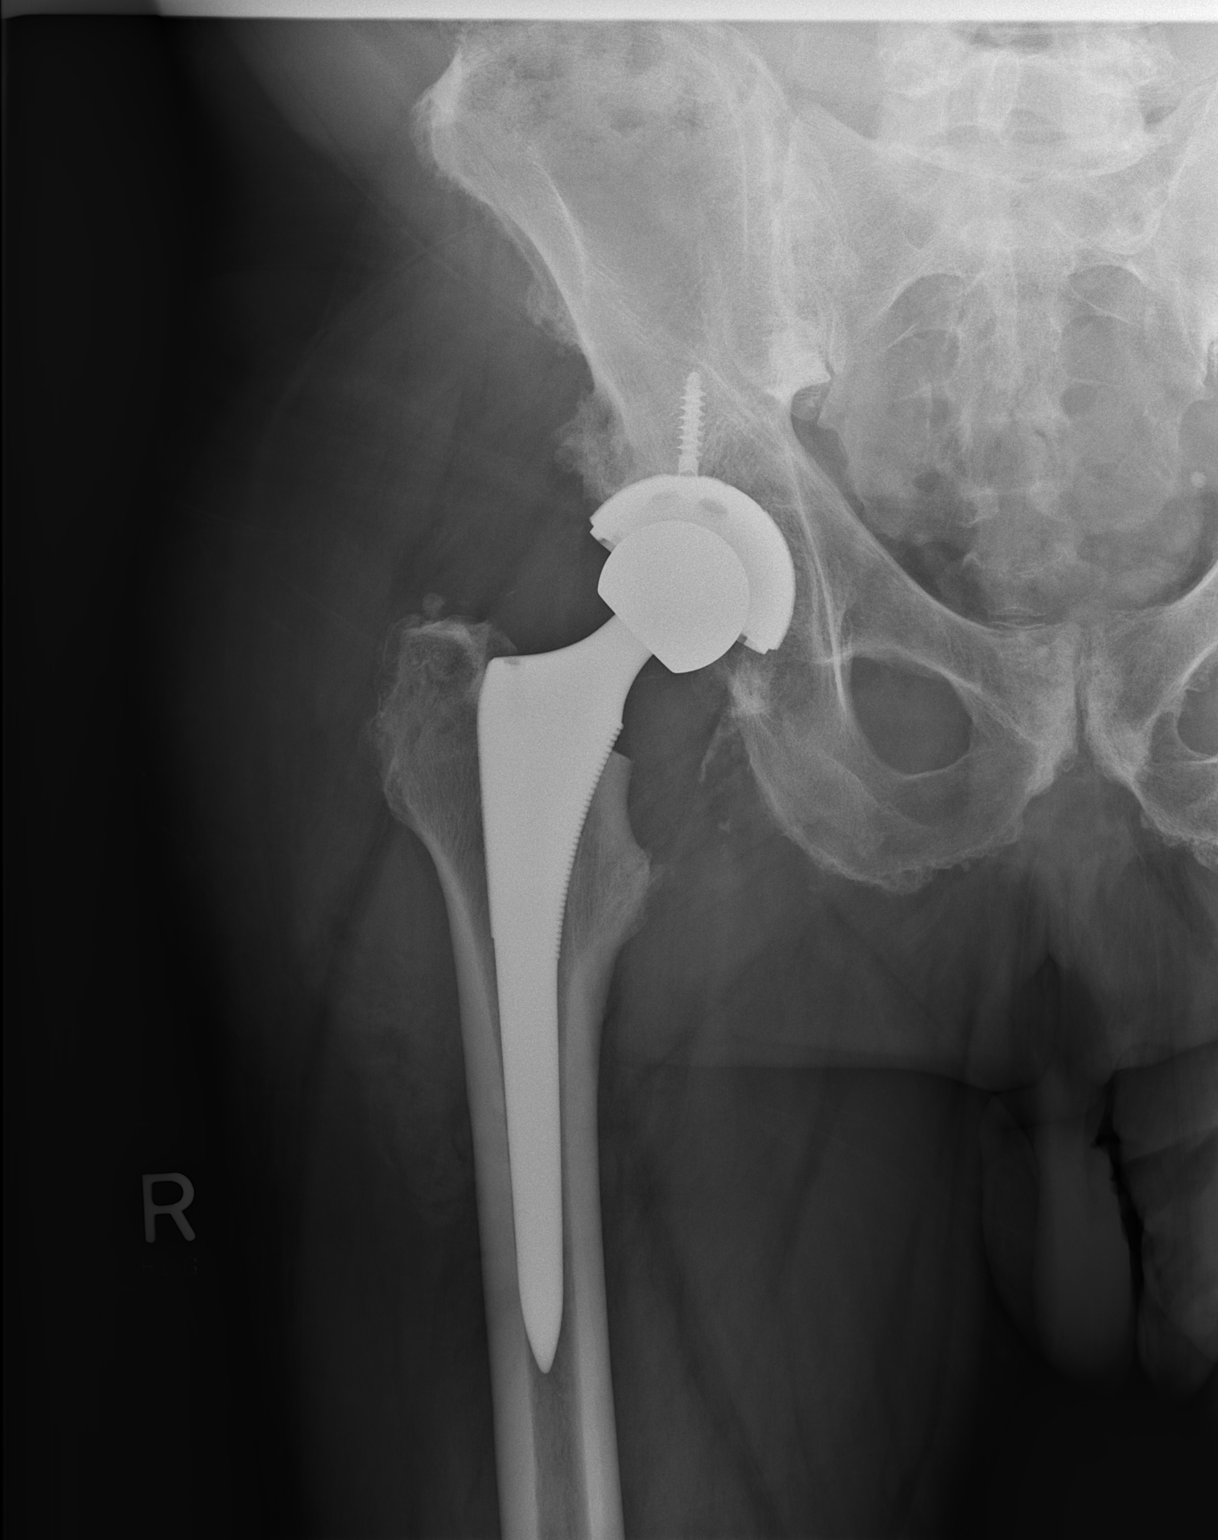

[t hip frog leg right]
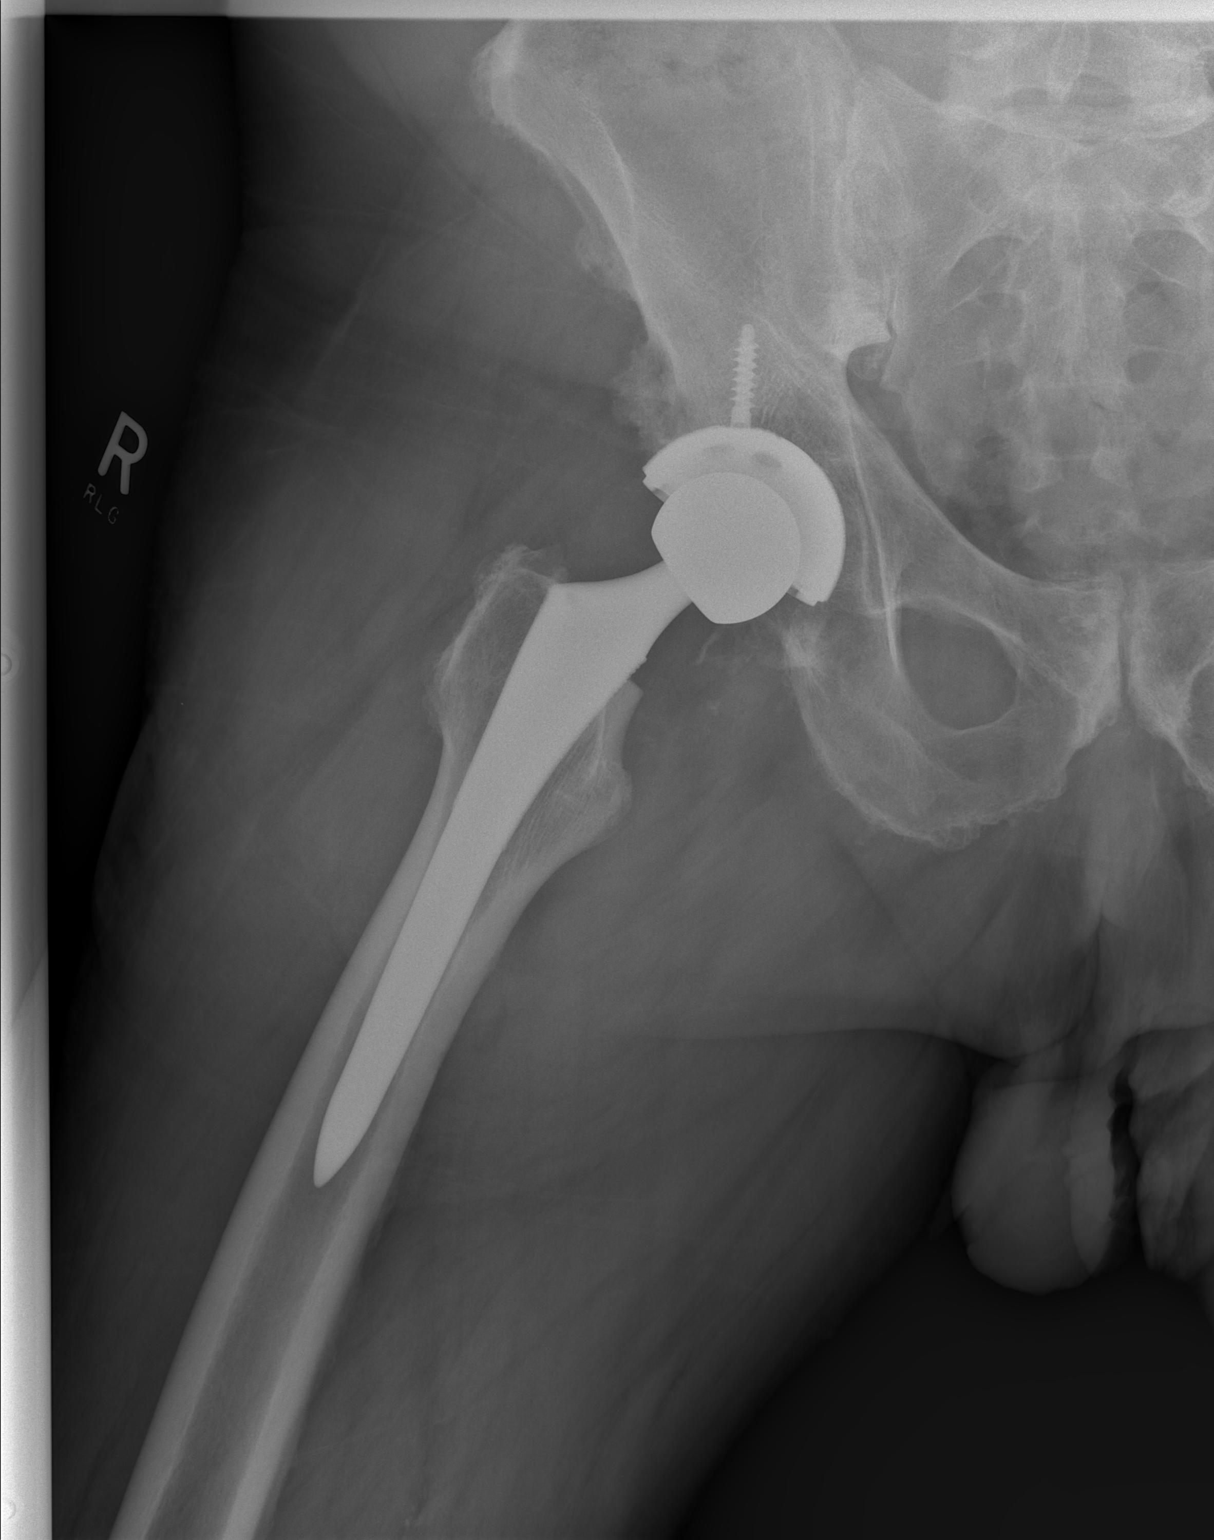

[3 of 3 positions shown; findings below may reference images not displayed]

FINDINGS: A right total hip arthroplasty is unchanged in appearance without
evidence of dislocation or periprosthetic fracture. Degenerative
changes are noted at the pubic symphysis.
IMPRESSION: Unchanged appearance of right total hip arthroplasty without
evidence of acute osseous abnormality.

## 2020-10-01 DIAGNOSIS — M79672 Pain in left foot: Secondary | ICD-10-CM | POA: Diagnosis not present

## 2020-10-01 DIAGNOSIS — Z96641 Presence of right artificial hip joint: Secondary | ICD-10-CM | POA: Diagnosis not present

## 2020-10-01 DIAGNOSIS — M722 Plantar fascial fibromatosis: Secondary | ICD-10-CM | POA: Diagnosis not present

## 2020-12-06 DIAGNOSIS — R590 Localized enlarged lymph nodes: Secondary | ICD-10-CM | POA: Diagnosis not present

## 2021-01-07 DIAGNOSIS — R221 Localized swelling, mass and lump, neck: Secondary | ICD-10-CM | POA: Diagnosis not present

## 2021-01-07 DIAGNOSIS — Z125 Encounter for screening for malignant neoplasm of prostate: Secondary | ICD-10-CM | POA: Diagnosis not present

## 2021-01-07 DIAGNOSIS — L989 Disorder of the skin and subcutaneous tissue, unspecified: Secondary | ICD-10-CM | POA: Diagnosis not present

## 2021-01-07 DIAGNOSIS — I10 Essential (primary) hypertension: Secondary | ICD-10-CM | POA: Diagnosis not present

## 2021-01-07 DIAGNOSIS — R590 Localized enlarged lymph nodes: Secondary | ICD-10-CM | POA: Diagnosis not present

## 2021-01-10 DIAGNOSIS — R590 Localized enlarged lymph nodes: Secondary | ICD-10-CM | POA: Diagnosis not present

## 2021-01-28 DIAGNOSIS — R590 Localized enlarged lymph nodes: Secondary | ICD-10-CM | POA: Diagnosis not present

## 2021-03-04 DIAGNOSIS — R221 Localized swelling, mass and lump, neck: Secondary | ICD-10-CM | POA: Diagnosis not present

## 2021-04-24 ENCOUNTER — Other Ambulatory Visit: Payer: Self-pay

## 2021-04-24 MED ORDER — REPATHA SURECLICK 140 MG/ML ~~LOC~~ SOAJ: 140.0000 mg | SUBCUTANEOUS | 3 refills | Status: DC

## 2021-04-24 NOTE — Telephone Encounter (Signed)
repatha 90 day supply refilled

## 2021-05-14 IMAGING — DX DG PORTABLE PELVIS
1 series · 1 of 1 positions shown · non-contrast
Comparison: Radiograph dated 06/06/2019.

CLINICAL DATA: 68-year-old male with right hip dislocation.

EXAM:
PORTABLE PELVIS 1-2 VIEWS

[pelvis]
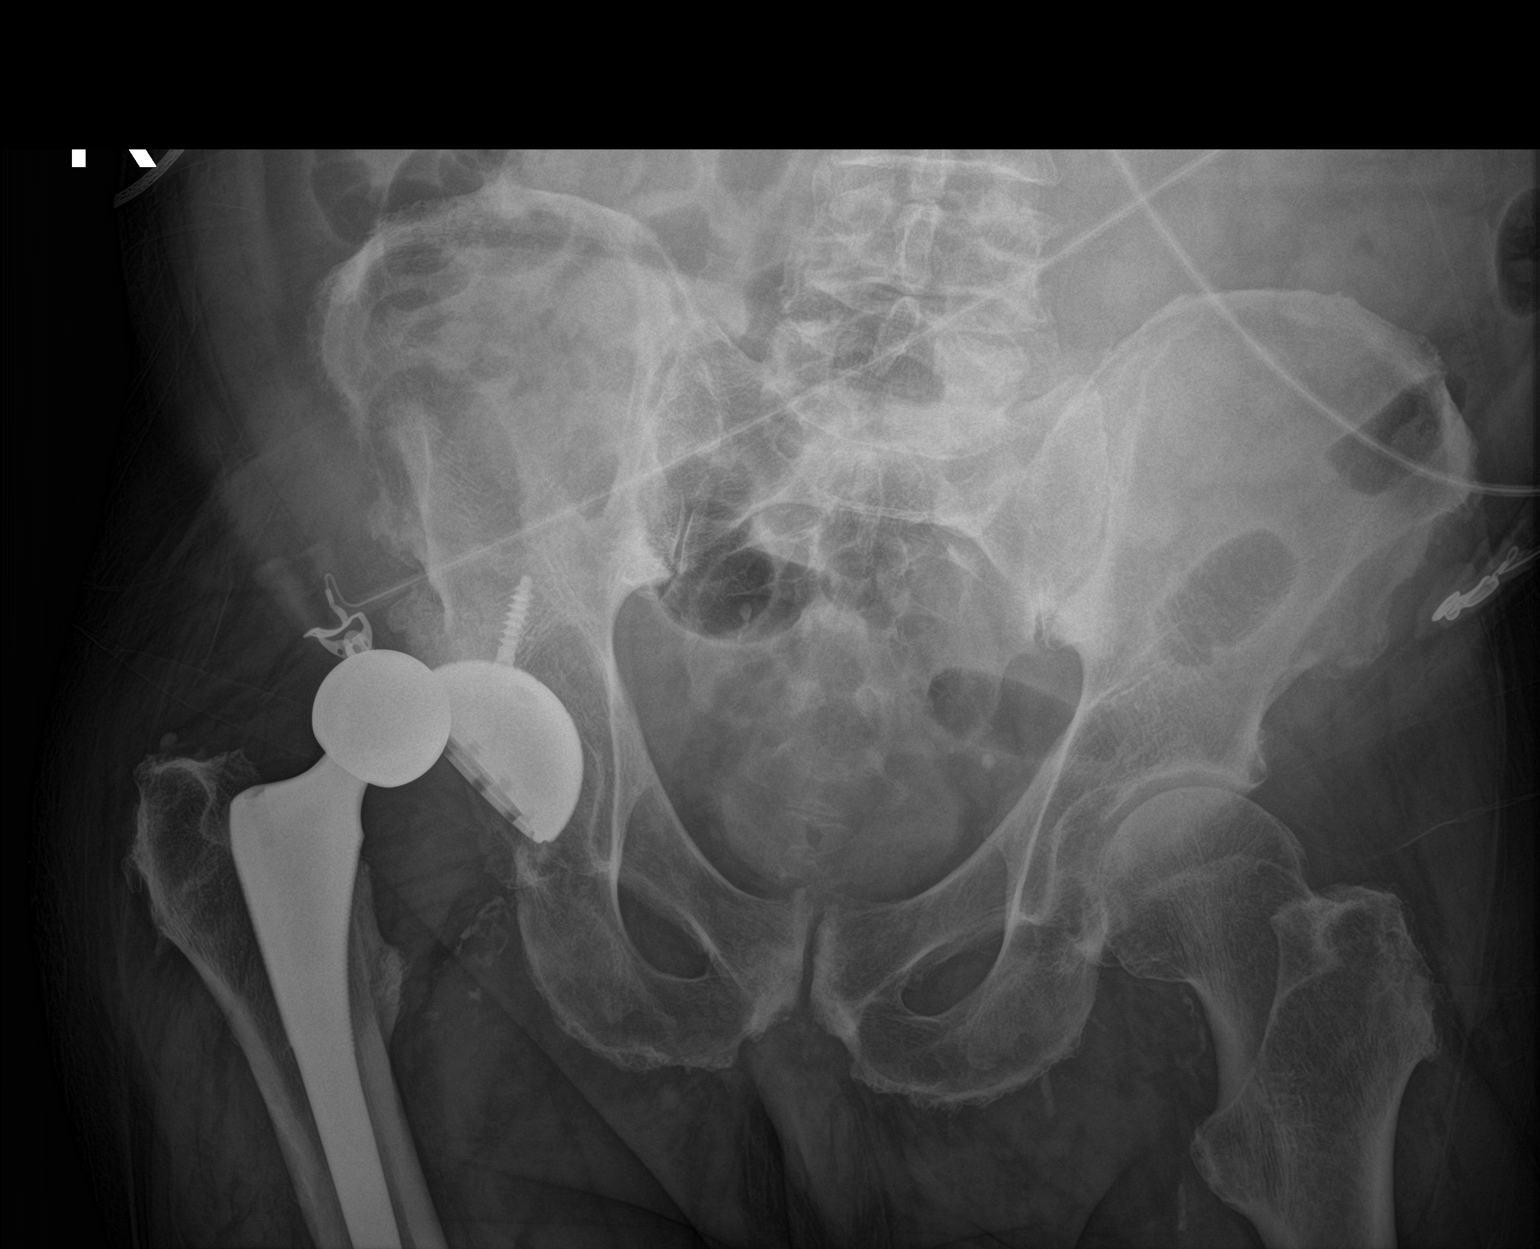

[1 of 1 positions shown; findings below may reference images not displayed]

FINDINGS: There is a total right hip arthroplasty. There is superior
dislocation of the femoral component outside of the acetabular cup.
There is no acute fracture. The bones are osteopenic. Mild
osteoarthritic changes of the left hip. There is degenerative
changes of the lower lumbar spine. The soft tissues are
unremarkable. Vascular calcifications noted.
IMPRESSION: Superiorly dislocated right hip arthroplasty.

## 2021-05-14 IMAGING — DX DG HIP (WITH OR WITHOUT PELVIS) 1V PORT*R*
1 series · 1 of 1 positions shown · non-contrast
Comparison: Radiograph 02/18/2020

CLINICAL DATA: Reduction

EXAM:
DG HIP (WITH OR WITHOUT PELVIS) 1V PORT RIGHT

[hip lat]
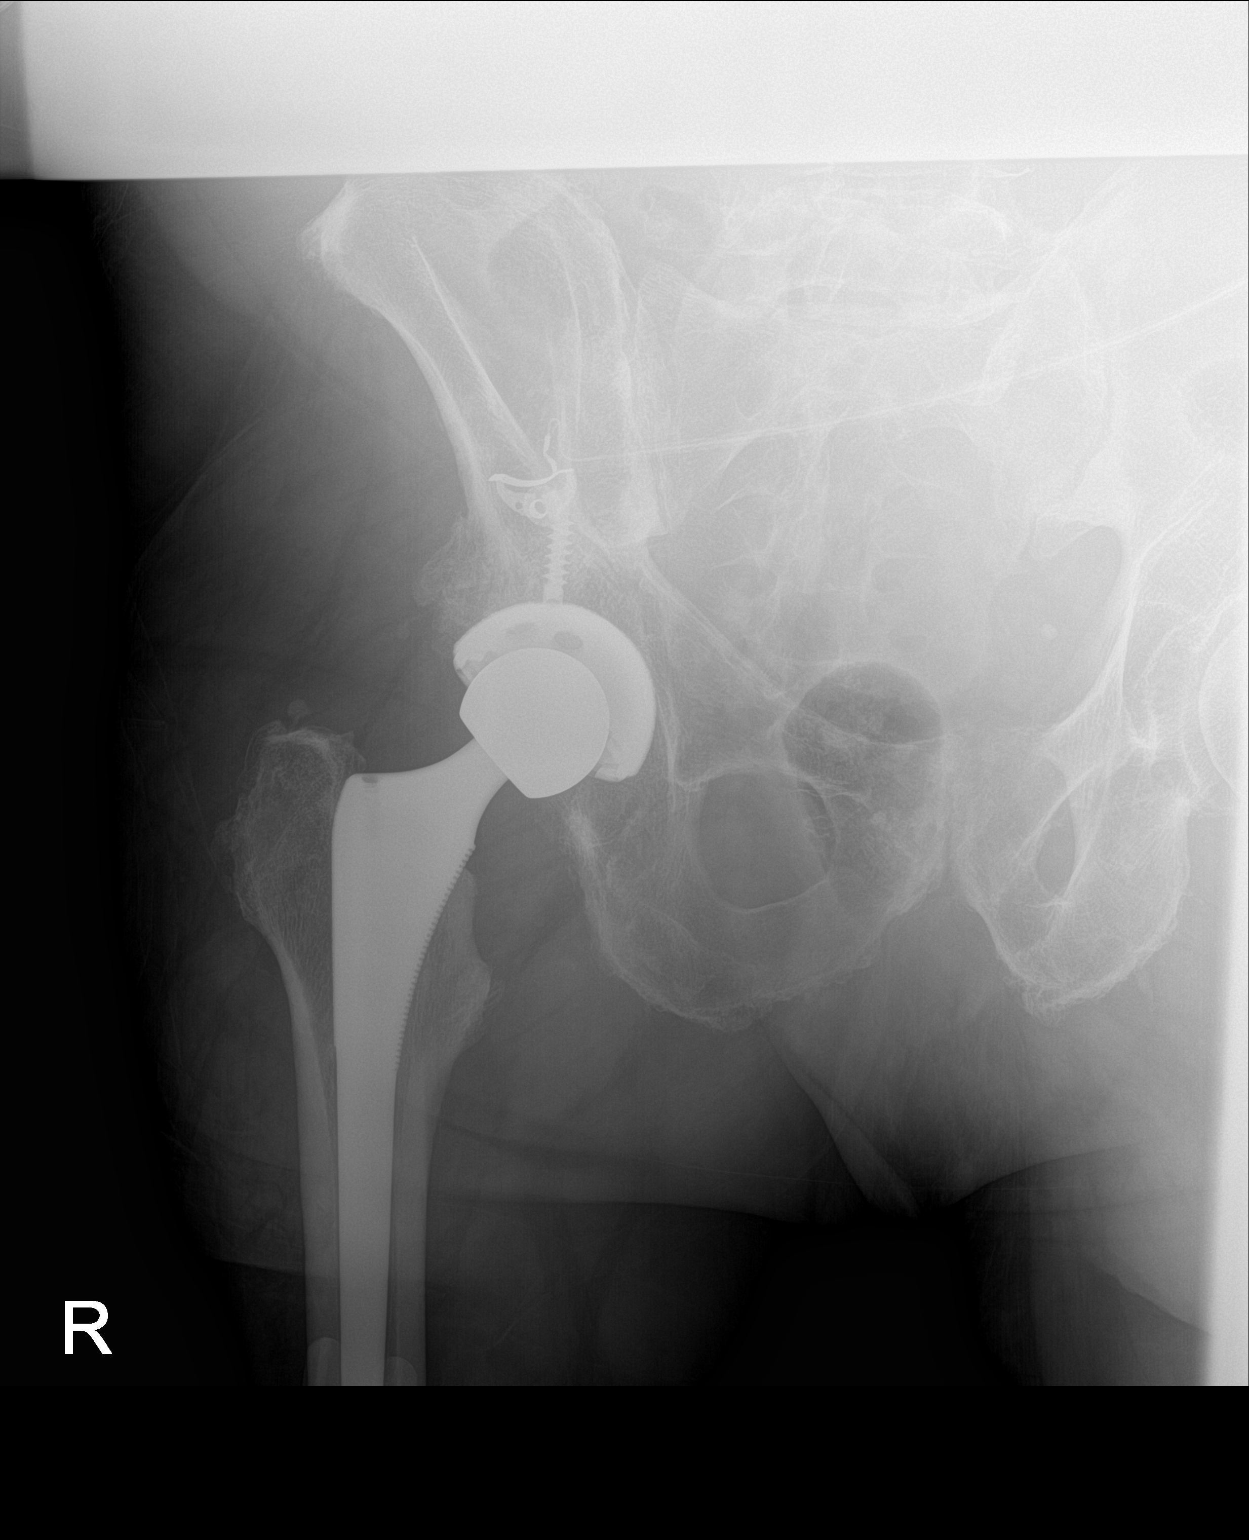

[1 of 1 positions shown; findings below may reference images not displayed]

FINDINGS: Interval relocation of the patient's articulating femoral component
within the screw fixed acetabular cup. No periprosthetic fracture or
lucency. No acute osseous abnormality is seen at this time. Mild
soft tissue swelling. Remaining soft tissues are unremarkable.
IMPRESSION: Apparent relocation of the patient's total right hip arthroplasty

## 2021-07-09 DIAGNOSIS — I10 Essential (primary) hypertension: Secondary | ICD-10-CM | POA: Diagnosis not present

## 2021-07-09 DIAGNOSIS — E7800 Pure hypercholesterolemia, unspecified: Secondary | ICD-10-CM | POA: Diagnosis not present

## 2021-07-09 DIAGNOSIS — G4700 Insomnia, unspecified: Secondary | ICD-10-CM | POA: Diagnosis not present

## 2021-07-09 DIAGNOSIS — R400 Somnolence: Secondary | ICD-10-CM | POA: Diagnosis not present

## 2021-07-09 DIAGNOSIS — M25572 Pain in left ankle and joints of left foot: Secondary | ICD-10-CM | POA: Diagnosis not present

## 2021-07-09 DIAGNOSIS — G2581 Restless legs syndrome: Secondary | ICD-10-CM | POA: Diagnosis not present

## 2021-07-09 DIAGNOSIS — Z0000 Encounter for general adult medical examination without abnormal findings: Secondary | ICD-10-CM | POA: Diagnosis not present

## 2021-07-09 DIAGNOSIS — R0683 Snoring: Secondary | ICD-10-CM | POA: Diagnosis not present

## 2021-07-15 DIAGNOSIS — E875 Hyperkalemia: Secondary | ICD-10-CM | POA: Diagnosis not present

## 2021-08-02 DIAGNOSIS — M7662 Achilles tendinitis, left leg: Secondary | ICD-10-CM | POA: Diagnosis not present

## 2021-08-02 DIAGNOSIS — M48062 Spinal stenosis, lumbar region with neurogenic claudication: Secondary | ICD-10-CM | POA: Diagnosis not present

## 2021-08-02 DIAGNOSIS — M79604 Pain in right leg: Secondary | ICD-10-CM | POA: Diagnosis not present

## 2021-08-02 DIAGNOSIS — M5136 Other intervertebral disc degeneration, lumbar region: Secondary | ICD-10-CM | POA: Diagnosis not present

## 2021-08-06 DIAGNOSIS — M47816 Spondylosis without myelopathy or radiculopathy, lumbar region: Secondary | ICD-10-CM | POA: Diagnosis not present

## 2021-08-06 DIAGNOSIS — M48062 Spinal stenosis, lumbar region with neurogenic claudication: Secondary | ICD-10-CM | POA: Diagnosis not present

## 2021-08-15 DIAGNOSIS — M545 Low back pain, unspecified: Secondary | ICD-10-CM | POA: Diagnosis not present

## 2021-08-15 DIAGNOSIS — M48062 Spinal stenosis, lumbar region with neurogenic claudication: Secondary | ICD-10-CM | POA: Diagnosis not present

## 2021-08-19 DIAGNOSIS — M48062 Spinal stenosis, lumbar region with neurogenic claudication: Secondary | ICD-10-CM | POA: Diagnosis not present

## 2021-08-20 ENCOUNTER — Ambulatory Visit: Payer: PPO | Admitting: Pulmonary Disease

## 2021-08-20 ENCOUNTER — Encounter: Payer: Self-pay | Admitting: Pulmonary Disease

## 2021-08-20 ENCOUNTER — Other Ambulatory Visit: Payer: Self-pay

## 2021-08-20 VITALS — BP 110/68 | HR 88 | Temp 97.8°F | Ht 68.0 in | Wt 230.0 lb

## 2021-08-20 DIAGNOSIS — G2581 Restless legs syndrome: Secondary | ICD-10-CM

## 2021-08-20 DIAGNOSIS — G478 Other sleep disorders: Secondary | ICD-10-CM

## 2021-08-20 MED ORDER — GABAPENTIN 300 MG PO CAPS: 300.0000 mg | ORAL_CAPSULE | Freq: Every day | ORAL | 3 refills | Status: DC

## 2021-08-20 NOTE — Progress Notes (Signed)
Lee Mclaughlin    [id number]    1951/05/02  Primary Care Physician:Wilson, Jama Flavors, MD  Referring Physician: Aletha Halim., PA-C [address]  [place] [address]  Chief complaint:   Patient with a chronic history of restless leg syndrome  HPI:  Escalating doses of Requip has not really helped symptoms  Nonrestorative sleep  Sleep study a few years back was negative for significant sleep disordered breathing, did not sleep well at all Was significant for restless legs  He does snore  Significant history of back pain and back surgeries, recently had in the last year over 9 surgeries as stated He does get injections in his back to control his chronic pain  Usually goes to bed about 10 PM, falls asleep quickly Wakes up about every 2 hours Final wake up time about 6 AM  Weight has been relatively stable history of coronary artery disease, history of MI, history of cardiac dysrhythmia status post ablation  Currently uses 2 mg of Requip in the morning, may use a milligram middle of the day and then 2 mg at night  Outpatient Encounter Medications as of 08/20/2021  Medication Sig   albuterol (VENTOLIN HFA) 108 (90 Base) MCG/ACT inhaler Inhale 2 puffs into the lungs every 6 (six) hours as needed for wheezing or shortness of breath.   benzonatate (TESSALON) 200 MG capsule Take 200 mg by mouth in the morning, at noon, and at bedtime.   carvedilol (COREG) 12.5 MG tablet Take 12.5 mg by mouth 2 (two) times daily with a meal.   docusate sodium (COLACE) 100 MG capsule Take 1 capsule (100 mg total) by mouth 2 (two) times daily.   Evolocumab (REPATHA SURECLICK) 174 MG/ML SOAJ Inject 140 mg into the skin every 14 (fourteen) days.   gabapentin (NEURONTIN) 300 MG capsule Take 1 capsule (300 mg total) by mouth at bedtime.   loratadine (CLARITIN) 10 MG tablet Take 10 mg by mouth daily as needed for allergies.   methocarbamol (ROBAXIN) 500 MG tablet Take 1 tablet (500 mg  total) by mouth every 6 (six) hours as needed for muscle spasms.   olmesartan (BENICAR) 40 MG tablet Take 40 mg by mouth every morning.    rOPINIRole (REQUIP) 1 MG tablet Take 2-3 mg by mouth 3 (three) times daily as needed (restless legs (max 6 tablets per day)).    [DISCONTINUED] ferrous sulfate (FERROUSUL) 325 (65 FE) MG tablet Take 1 tablet (325 mg total) by mouth 3 (three) times daily with meals for 14 days.   [DISCONTINUED] HYDROcodone-acetaminophen (NORCO) 7.5-325 MG tablet Take 1-2 tablets by mouth every 4 (four) hours as needed for moderate pain. (Patient not taking: Reported on 08/20/2021)   [DISCONTINUED] polyethylene glycol (MIRALAX / GLYCOLAX) 17 g packet Take 17 g by mouth 2 (two) times daily. (Patient not taking: Reported on 08/20/2021)   No facility-administered encounter medications on file as of 08/20/2021.    Allergies as of 08/20/2021 - Review Complete 08/20/2021  Allergen Reaction Noted   Morphine and related Nausea And Vomiting 07/23/2015   Crestor [rosuvastatin] Other (See Comments) 11/20/2013   Vytorin [ezetimibe-simvastatin] Other (See Comments) 11/20/2013    Past Medical History:  Diagnosis Date   Coronary artery disease    COVID-19    In december 2020   Dyslipidemia    Dysrhythmia    a flutter   GERD (gastroesophageal reflux disease)    H/O cardiovascular stress test 10/19/2012   MET  test - good effort, peak VO2 of almost 112% predicted, HR 79% predicted, mildly ischemia at end of exercise   History of hyperthyroidism as a child    History of nuclear stress test 03/2011   bruce myoview; mild perfusion defect in mid anteroseptal, apical septal, apical regions (infarct/scar/breast attenuation); post-stress EF 55%; no signficant ischemia   Ischemic cardiomyopathy    history of    Myocardial infarction (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 Gerrie Nordmann)    [place]     COLONOSCOPY     CORONARY ARTERY BYPASS GRAFT  06/05/1999   LIMA to LAD, SVG to diagonal 1, SVG to PDA of Cfx, right radial graft to OM1 (Dr. Remus Loffler)    ESOPHAGOGASTRODUODENOSCOPY     EYE SURGERY     KNEE SURGERY Right    pt. states x 6    TOTAL HIP ARTHROPLASTY Right 05/24/2019   Procedure: TOTAL HIP ARTHROPLASTY;  Surgeon: Marchia Bond, MD;  Location: [facility];  Service: Orthopedics;  Laterality: Right;   TOTAL HIP REVISION Right 03/15/2020   Procedure: TOTAL HIP REVISION, posterior approach;  Surgeon: Paralee Cancel, MD;  Location: [facility];  Service: Orthopedics;  Laterality: Right;  90 mins posterior approach   TOTAL KNEE ARTHROPLASTY Right 07/23/2015   Procedure: RIGHT TOTAL KNEE ARTHROPLASTY;  Surgeon: Elsie Saas, MD;  Location: [facility];  Service: Orthopedics;  Laterality: Right;   TRANSTHORACIC ECHOCARDIOGRAM  02/2008   EF 40%, severe apical wall hypokinesis, mod-severe anterior wall hypokinesis; RV mildly dilated; mild mitral annular calcif; mild TR, RSVP 30-60mmHg    Family History  Problem Relation Age of Onset   CAD Father        s/p CABGx4 at 37    Hyperlipidemia Father     Social History   Socioeconomic History   Marital status: Married    Spouse name: Not on file   Number of children: 2   Years of education: 12   Highest education level: Not on file  Occupational History    Employer: [employer]  Tobacco Use   Smoking status: Some Days    Types: Cigars   Smokeless tobacco: Never   Tobacco comments:    cigar occassionally  Vaping Use   Vaping Use: Never used  Substance and Sexual Activity   Alcohol use: Yes    Alcohol/week: 0.0 standard drinks    Comment: minimal   Drug use: No   Sexual activity: Not Currently  Other Topics Concern   Not on file  Social History Narrative  Not on file   Social Determinants of Health   Financial Resource Strain: Not on file  Food Insecurity: Not on file  Transportation Needs: Not on file  Physical Activity: Not on file  Stress: Not on file  Social Connections: Not on file  Intimate Partner Violence: Not on file    Review of Systems  Constitutional:  Negative for fatigue.  Respiratory:  Negative for shortness of breath.   Psychiatric/Behavioral:  Positive for sleep disturbance.    Vitals:   08/20/21 1609  BP: 110/68  Pulse: 88  Temp: 97.8 F (36.6 C)  SpO2: 98%     Physical Exam Constitutional:      Appearance: He is obese.  HENT:     Head: Normocephalic.     Nose: No congestion.     Mouth/Throat:     Mouth: Mucous membranes are moist.  Eyes:     Pupils: Pupils are equal, round, and reactive to light.  Cardiovascular:     Rate and Rhythm: Normal rate and regular rhythm.     Heart sounds: No murmur heard.   No friction rub.  Pulmonary:     Effort: No respiratory distress.     Breath sounds: No stridor. No wheezing.  Musculoskeletal:     Cervical back: No rigidity or tenderness.  Neurological:     Mental Status: He is alert.  Psychiatric:        Mood and Affect: Mood normal.     Data Reviewed: Most recent echocardiogram 02/24/2020 with  normal ejection fraction, normal right ventricular systolic function  Assessment:  Restless leg syndrome  Associated sleep maintenance insomnia  Chronic back pain  Snoring  Plan/Recommendations: Switch to Neurontin-300 mg nightly -May wean off Requip slowly  Schedule for home sleep study  Tentative follow-up in about a month  Gabapentin enacarbil will be considered if he tolerates gabapentin well -He stated he had some problems with gabapentin in the past  Encouraged to call with any significant concerns   Sherrilyn Rist MD [facility] 08/20/2021, 4:39 PM  CC: Aletha Halim., PA-C

## 2021-08-20 NOTE — Patient Instructions (Signed)
Gabapentin for restless legs  -300 mg nightly -Take about 3 to 4 hours before bedtime about 5 PM  Wean off Requip slowly  I will see you in 1 month  We will schedule you for a home sleep study -It is taking [place] a few weeks to get this situated  Call with significant concerns

## 2021-09-10 DIAGNOSIS — M545 Low back pain, unspecified: Secondary | ICD-10-CM | POA: Diagnosis not present

## 2021-09-10 DIAGNOSIS — M48062 Spinal stenosis, lumbar region with neurogenic claudication: Secondary | ICD-10-CM | POA: Diagnosis not present

## 2021-09-10 DIAGNOSIS — M47816 Spondylosis without myelopathy or radiculopathy, lumbar region: Secondary | ICD-10-CM | POA: Diagnosis not present

## 2021-09-10 DIAGNOSIS — F4322 Adjustment disorder with anxiety: Secondary | ICD-10-CM | POA: Diagnosis not present

## 2021-09-13 ENCOUNTER — Telehealth: Payer: Self-pay | Admitting: Pulmonary Disease

## 2021-09-13 DIAGNOSIS — M48062 Spinal stenosis, lumbar region with neurogenic claudication: Secondary | ICD-10-CM | POA: Diagnosis not present

## 2021-09-13 MED ORDER — GABAPENTIN 300 MG PO CAPS: 300.0000 mg | ORAL_CAPSULE | Freq: Every day | ORAL | 1 refills | Status: DC

## 2021-09-13 NOTE — Telephone Encounter (Signed)
Called and spoke with patient. He was requesting to have his gabapentin RX changed to a 90 day supply and to be sent to [facility] on [address]. Advised him that I would send this in for him.   Nothing further needed at time of call.

## 2021-09-23 ENCOUNTER — Ambulatory Visit: Payer: PPO | Admitting: Pulmonary Disease

## 2021-10-04 ENCOUNTER — Encounter: Payer: Self-pay | Admitting: Pulmonary Disease

## 2021-10-04 ENCOUNTER — Ambulatory Visit: Payer: PPO | Admitting: Pulmonary Disease

## 2021-10-04 ENCOUNTER — Other Ambulatory Visit: Payer: Self-pay

## 2021-10-04 ENCOUNTER — Other Ambulatory Visit: Payer: Self-pay | Admitting: Pulmonary Disease

## 2021-10-04 VITALS — BP 114/64 | HR 69 | Temp 97.6°F | Ht 68.0 in | Wt 233.2 lb

## 2021-10-04 DIAGNOSIS — G2581 Restless legs syndrome: Secondary | ICD-10-CM | POA: Diagnosis not present

## 2021-10-04 MED ORDER — GABAPENTIN ENACARBIL ER 600 MG PO TBCR: 600.0000 mg | EXTENDED_RELEASE_TABLET | Freq: Every evening | ORAL | 1 refills | Status: DC

## 2021-10-04 NOTE — Progress Notes (Signed)
Lee Mclaughlin    [id number]    03/25/1951  Primary Care Physician:Wilson, Jama Flavors, MD  Referring Physician: Aletha Halim., PA-C [address]  [place] [address]  Chief complaint:   Patient with a chronic history of restless leg syndrome In for follow-up today  HPI:  Escalating doses of Requip has not really helped symptoms Gabapentin was started during the last visit Noticed slight improvement in symptoms  Has weaned off Requip  Nonrestorative sleep  Sleep study a few years back was negative for significant sleep disordered breathing, did not sleep well at all Was significant for restless legs.    He does snore Still not sleeping through the night  Significant history of back pain and back surgeries, recently had in the last year over 9 surgeries as stated He does get injections in his back to control his chronic pain Now talking about surgery as his next option  Sleep habits have not changed much Usually goes to bed about 10 PM, falls asleep quickly Wakes up about every 2 hours Final wake up time about 6 AM  Weight has been relatively stable history of coronary artery disease, history of MI, history of cardiac dysrhythmia status post ablation  Outpatient Encounter Medications as of 08/20/2021  Medication Sig   albuterol (VENTOLIN HFA) 108 (90 Base) MCG/ACT inhaler Inhale 2 puffs into the lungs every 6 (six) hours as needed for wheezing or shortness of breath.   benzonatate (TESSALON) 200 MG capsule Take 200 mg by mouth in the morning, at noon, and at bedtime.   carvedilol (COREG) 12.5 MG tablet Take 12.5 mg by mouth 2 (two) times daily with a meal.   docusate sodium (COLACE) 100 MG capsule Take 1 capsule (100 mg total) by mouth 2 (two) times daily.   Evolocumab (REPATHA SURECLICK) 409 MG/ML SOAJ Inject 140 mg into the skin every 14 (fourteen) days.   gabapentin (NEURONTIN) 300 MG capsule Take 1 capsule (300 mg total) by mouth at bedtime.    loratadine (CLARITIN) 10 MG tablet Take 10 mg by mouth daily as needed for allergies.   methocarbamol (ROBAXIN) 500 MG tablet Take 1 tablet (500 mg total) by mouth every 6 (six) hours as needed for muscle spasms.   olmesartan (BENICAR) 40 MG tablet Take 40 mg by mouth every morning.    rOPINIRole (REQUIP) 1 MG tablet Take 2-3 mg by mouth 3 (three) times daily as needed (restless legs (max 6 tablets per day)).    [DISCONTINUED] ferrous sulfate (FERROUSUL) 325 (65 FE) MG tablet Take 1 tablet (325 mg total) by mouth 3 (three) times daily with meals for 14 days.   [DISCONTINUED] HYDROcodone-acetaminophen (NORCO) 7.5-325 MG tablet Take 1-2 tablets by mouth every 4 (four) hours as needed for moderate pain. (Patient not taking: Reported on 08/20/2021)   [DISCONTINUED] polyethylene glycol (MIRALAX / GLYCOLAX) 17 g packet Take 17 g by mouth 2 (two) times daily. (Patient not taking: Reported on 08/20/2021)   No facility-administered encounter medications on file as of 08/20/2021.    Allergies as of 08/20/2021 - Review Complete 08/20/2021  Allergen Reaction Noted   Morphine and related Nausea And Vomiting 07/23/2015   Crestor [rosuvastatin] Other (See Comments) 11/20/2013   Vytorin [ezetimibe-simvastatin] Other (See Comments) 11/20/2013    Past Medical History:  Diagnosis Date   Coronary artery disease    COVID-19    In december 2020   Dyslipidemia    Dysrhythmia  a flutter   GERD (gastroesophageal reflux disease)    H/O cardiovascular stress test 10/19/2012   MET test - good effort, peak VO2 of almost 112% predicted, HR 79% predicted, mildly ischemia at end of exercise   History of hyperthyroidism as a child    History of nuclear stress test 03/2011   bruce myoview; mild perfusion defect in mid anteroseptal, apical septal, apical regions (infarct/scar/breast attenuation); post-stress EF 55%; no signficant ischemia   Ischemic cardiomyopathy    history of    Myocardial infarction ([place])     Pneumonia    as a kid   PONV (postoperative nausea and vomiting)    Primary localized osteoarthritis of right hip 05/24/2019   Primary localized osteoarthritis of right knee    Restless leg    right total hip arthroplasty with dislocation of hip ([place]) 08/14/2019   S/P CABG (coronary artery bypass graft) 2000   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 Gerrie Nordmann)    [facility]     COLONOSCOPY     CORONARY ARTERY BYPASS GRAFT  06/05/1999   LIMA to LAD, SVG to diagonal 1, SVG to PDA of Cfx, right radial graft to OM1 (Dr. Remus Loffler)    ESOPHAGOGASTRODUODENOSCOPY     EYE SURGERY     KNEE SURGERY Right    pt. states x 6    TOTAL HIP ARTHROPLASTY Right 05/24/2019   Procedure: TOTAL HIP ARTHROPLASTY;  Surgeon: Marchia Bond, MD;  Location: [facility];  Service: Orthopedics;  Laterality: Right;   TOTAL HIP REVISION Right 03/15/2020   Procedure: TOTAL HIP REVISION, posterior approach;  Surgeon: Paralee Cancel, MD;  Location: [facility];  Service: Orthopedics;  Laterality: Right;  90 mins posterior approach   TOTAL KNEE ARTHROPLASTY Right 07/23/2015   Procedure: RIGHT TOTAL KNEE ARTHROPLASTY;  Surgeon: Elsie Saas, MD;  Location: [facility];  Service: Orthopedics;  Laterality: Right;   TRANSTHORACIC ECHOCARDIOGRAM  02/2008   EF 40%, severe apical wall hypokinesis, mod-severe anterior wall hypokinesis; RV mildly dilated; mild  mitral annular calcif; mild TR, RSVP 30-7mHg    Family History  Problem Relation Age of Onset   CAD Father        s/p CABGx4 at 720  Hyperlipidemia Father     Social History   Socioeconomic History   Marital status: Married    Spouse name: Not on file   Number of children: 2   Years of education: 12   Highest education level: Not on file  Occupational History    Employer: [employer]  Tobacco Use   Smoking status: Some Days    Types: Cigars   Smokeless tobacco: Never   Tobacco comments:    cigar occassionally  Vaping Use   Vaping Use: Never used  Substance and Sexual Activity   Alcohol use: Yes    Alcohol/week: 0.0 standard drinks    Comment: minimal   Drug use: No  Sexual activity: Not Currently  Other Topics Concern   Not on file  Social History Narrative   Not on file   Social Determinants of Health   Financial Resource Strain: Not on file  Food Insecurity: Not on file  Transportation Needs: Not on file  Physical Activity: Not on file  Stress: Not on file  Social Connections: Not on file  Intimate Partner Violence: Not on file    Review of Systems  Constitutional:  Negative for fatigue.  Respiratory:  Negative for shortness of breath.   Psychiatric/Behavioral:  Positive for sleep disturbance.    Vitals:   08/20/21 1609  BP: 110/68  Pulse: 88  Temp: 97.8 F (36.6 C)  SpO2: 98%     Physical Exam Constitutional:      Appearance: He is obese.  HENT:     Head: Normocephalic.     Nose: No congestion.     Mouth/Throat:     Mouth: Mucous membranes are moist.  Eyes:     Pupils: Pupils are equal, round, and reactive to light.  Cardiovascular:     Rate and Rhythm: Normal rate and regular rhythm.     Heart sounds: No murmur heard.   No friction rub.  Pulmonary:     Effort: No respiratory distress.     Breath sounds: No stridor. No wheezing.  Musculoskeletal:     Cervical back: No rigidity or tenderness.  Neurological:     Mental  Status: He is alert.  Psychiatric:        Mood and Affect: Mood normal.     Data Reviewed: Most recent echocardiogram 02/24/2020 with normal ejection fraction, normal right ventricular systolic function  Assessment:  Restless leg syndrome -Symptoms feel little bit better according to the patient with gabapentin  Associated sleep maintenance insomnia -Still with significant insomnia and multiple awakenings  Chronic back pain -Surgical options being explored  Snoring -Further evaluation will involve a sleep study once we are able to get in better quality sleep after controlling his restless legs a little bit better  Plan/Recommendations: Switched to gabapentin encarbril 631m  Will consider home sleep study once sleep quality improved   follow-up in about 2 months   Encouraged to call with any significant concerns  Did look up side effect profile of gabapentin, no significant interaction with loratadine  I spent 30 minutes dedicated to the care of this patient on the date of this encounter to include previsit review of records, face-to-face time with the patient discussing conditions above, post visit ordering of testing, clinical documentation with electronic health record and communicated necessary findings to members of the patient's care team   ASherrilyn RistMD [facility] 08/20/2021, 4:39 PM  CC: KAletha Halim, PA-C

## 2021-10-04 NOTE — Patient Instructions (Signed)
Switch to longer acting gabapentin 600 mg  I will see you in 2 months  Call with significant concerns  We can talk more about obtaining a sleep study once your sleep quality improves

## 2021-10-07 ENCOUNTER — Telehealth: Payer: Self-pay | Admitting: Pulmonary Disease

## 2021-10-07 NOTE — Telephone Encounter (Signed)
Patient states needs Gabapentin 300 mg called into pharmacy.  Needs one month supply.Would like to know if he takes one in the morning and one at night. Pharmacy is [facility] [address] Patient phone number is [phone].

## 2021-10-08 DIAGNOSIS — M6281 Muscle weakness (generalized): Secondary | ICD-10-CM | POA: Diagnosis not present

## 2021-10-08 DIAGNOSIS — M48062 Spinal stenosis, lumbar region with neurogenic claudication: Secondary | ICD-10-CM | POA: Diagnosis not present

## 2021-10-08 DIAGNOSIS — M545 Low back pain, unspecified: Secondary | ICD-10-CM | POA: Diagnosis not present

## 2021-10-08 NOTE — Telephone Encounter (Signed)
Spoke with the pt  He states that the 600 mg gabapentin extended release is not covered by insurance  He still has enough 300 mg tablets lett to start taking 2 tablets at bedtime  He is asking if this is okay, and if so needs rx called in so he does not run out  He wonders if we call med in like this, if he should take 1 in the am and 1 at bedtime or just 2 at bedtime  Please advise, thanks!

## 2021-10-09 ENCOUNTER — Other Ambulatory Visit: Payer: Self-pay | Admitting: Pulmonary Disease

## 2021-10-09 MED ORDER — GABAPENTIN 600 MG PO TABS: 600.0000 mg | ORAL_TABLET | Freq: Every day | ORAL | 3 refills | Status: DC

## 2021-10-09 NOTE — Telephone Encounter (Signed)
I will send in a prescription for 600 mg at nighttime

## 2021-10-09 NOTE — Telephone Encounter (Signed)
Called and spoke with pt letting him know that [facility] sent Rx for 600mg  gabapentin to pharmacy for him and he verbalized understanding. Nothing further needed.

## 2021-10-09 NOTE — Progress Notes (Signed)
600 mg sent in

## 2021-10-10 ENCOUNTER — Other Ambulatory Visit: Payer: Self-pay | Admitting: *Deleted

## 2021-10-11 DIAGNOSIS — M48062 Spinal stenosis, lumbar region with neurogenic claudication: Secondary | ICD-10-CM | POA: Diagnosis not present

## 2021-11-05 DIAGNOSIS — M48062 Spinal stenosis, lumbar region with neurogenic claudication: Secondary | ICD-10-CM | POA: Diagnosis not present

## 2021-11-08 DIAGNOSIS — M5416 Radiculopathy, lumbar region: Secondary | ICD-10-CM | POA: Diagnosis not present

## 2021-11-19 ENCOUNTER — Other Ambulatory Visit: Payer: Self-pay | Admitting: Orthopedic Surgery

## 2021-11-20 ENCOUNTER — Telehealth: Payer: Self-pay

## 2021-11-20 NOTE — Telephone Encounter (Signed)
° °  Pre-operative Risk Assessment    Patient Name: Lee Mclaughlin  DOB: [birth year] MRN: [id number]      Request for Surgical Clearance    Procedure:   Left sided Lumbar 4-5 Transforaminal  Lumbar Interbody Fusion with Instrumentation and Allograft   Date of Surgery:  Clearance 11/27/21                                 Surgeon:  Dr.Salvador Yevette Edwards Surgeon's Group or Practice Name:  [facility] Phone number:  [phone] Fax number:  [phone]   Type of Clearance Requested:   - Medical    Type of Anesthesia:  Not Indicated   Additional requests/questions:  Please fax a copy of clearance to the surgeon's office.  Vangie Bicker   11/20/2021, 8:59 AM

## 2021-11-20 NOTE — Telephone Encounter (Signed)
° °  Name: Lee Mclaughlin  DOB: [birth year]  MRN: [id number]  Primary Cardiologist: Chrystie Nose, MD  Chart reviewed as part of pre-operative protocol coverage. Because of [name]'s past medical history and time since last visit, he will require a follow-up visit in order to better assess preoperative cardiovascular risk.  Pre-op covering staff: - Please schedule appointment and call patient to inform them. If patient already had an upcoming appointment within acceptable timeframe, please add "pre-op clearance" to the appointment notes so provider is aware. - Please contact requesting surgeon's office via preferred method (i.e, phone, fax) to inform them of need for appointment prior to surgery.  If applicable, this message will also be routed to pharmacy pool and/or primary cardiologist for input on holding anticoagulant/antiplatelet agent as requested below so that this information is available to the clearing provider at time of patient's appointment.   [place], [place]  11/20/2021, 11:03 AM

## 2021-11-20 NOTE — Telephone Encounter (Signed)
Pt is scheduled to see Dr. Rennis Golden, tomorrow, 11/21/2021 @ 1:15 @ [facility], and clearance will be addressed at that time.   Pt has been made aware of the location.  Will route back to the requesting surgeon's office to make them aware.

## 2021-11-21 ENCOUNTER — Encounter (HOSPITAL_BASED_OUTPATIENT_CLINIC_OR_DEPARTMENT_OTHER): Payer: Self-pay | Admitting: Internal Medicine

## 2021-11-21 ENCOUNTER — Ambulatory Visit: Payer: PPO | Admitting: Pulmonary Disease

## 2021-11-21 ENCOUNTER — Ambulatory Visit (INDEPENDENT_AMBULATORY_CARE_PROVIDER_SITE_OTHER): Payer: PPO | Admitting: Internal Medicine

## 2021-11-21 ENCOUNTER — Other Ambulatory Visit: Payer: Self-pay

## 2021-11-21 VITALS — BP 126/72 | HR 71 | Ht 68.0 in | Wt 238.2 lb

## 2021-11-21 DIAGNOSIS — Z01818 Encounter for other preprocedural examination: Secondary | ICD-10-CM | POA: Diagnosis not present

## 2021-11-21 DIAGNOSIS — Z951 Presence of aortocoronary bypass graft: Secondary | ICD-10-CM

## 2021-11-21 DIAGNOSIS — E785 Hyperlipidemia, unspecified: Secondary | ICD-10-CM | POA: Diagnosis not present

## 2021-11-21 NOTE — Patient Instructions (Signed)
Medication Instructions:  Dr.Hilty recommends resuming Repatha  If you need co-pay assistance, please apply for a grant from [email or web address] under "disease funds" and then "hypercholesterolemia"  *If you need a refill on your cardiac medications before your next appointment, please call your pharmacy*   Lab Work: FASTING lipid panel to check cholesterol when able   If you have labs (blood work) drawn today and your tests are completely normal, you will receive your results only by: MyChart Message (if you have MyChart) OR A paper copy in the mail If you have any lab test that is abnormal or we need to change your treatment, we will call you to review the results.   Testing/Procedures: NONE   Follow-Up: At [facility], you and your health needs are our priority.  As part of our continuing mission to provide you with exceptional heart care, we have created designated Provider Care Teams.  These Care Teams include your primary Cardiologist (physician) and Advanced Practice Providers (APPs -  Physician Assistants and Nurse Practitioners) who all work together to provide you with the care you need, when you need it.  We recommend signing up for the patient portal called "MyChart".  Sign up information is provided on this After Visit Summary.  MyChart is used to connect with patients for Virtual Visits (Telemedicine).  Patients are able to view lab/test results, encounter notes, upcoming appointments, etc.  Non-urgent messages can be sent to your provider as well.   To learn more about what you can do with MyChart, go to [email or web address].    Your next appointment:   12 month(s)  The format for your next appointment:   In Person  Provider:   Chrystie Nose, MD {

## 2021-11-21 NOTE — Progress Notes (Addendum)
OFFICE NOTE  Chief Complaint:  Preoperative clearance  Primary Care Physician: [name], MD  HPI:  Lee Mclaughlin  is a 70 year old gentleman with a history of coronary disease, status post CABG x 4 in 2000 (LIMA to LAD, SVG to T1, SVG to PDA (left dominant), free radial graft to OM1. His heart cath in 2008 showed no obstructive coronary artery disease after his nuclear stress test was negative. EF was 40%; however, came back to normal in 2012. He is actually incredibly active at this point. 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 He does high impact activity. Recentl was involved with the [employer] program and had been going out to [facility] to receive treatment, until recently when he had a disagreement with his boss and ultimately lost his job and these treatments. This consisted of anti-aging therapy of vitamins, supplements, testosterone injections, etc. I reviewed laboratory work that was performed from that program, which indicates low hemoglobin A1c of 5.3. His CBC was within normal limits. Testosterone tests were at the high end of normal with low fasting serum insulin. His PSA was low. T3, TSH, and T4 were within normal limits. His lipid profile showed total cholesterol of 153, triglycerides 94, HDL 49, LDL 85, which is a good profile. He does have a history of intolerance to statins and has been maintained on fenofibrate and Zetia, and for not being on a statin, actually that LDL cholesterol is pretty low.   He recently discontinued his statin medication and reports marked improvement in chronic pain. He's also stopped high level activity and exercise and has cut back significantly on testosterone injections. He reports that he wishes now to get back to doing some exercise after he returns from a visit out of the country. He is curious to see what his cholesterol looks like today as it's been about 6 months off of medication.  Finally he is reporting some  tenderness and tingling with occasional swelling and a lump that develops under the left lateral malleolus. There is some associated numbness with this.  I saw Lee Mclaughlin back in the office today. He is reporting some abdominal bloating and recently has had problems with some fatigue. He decrease his exercise considerably. He's also had some weight gain. He had stopped taking testosterone but started back on it every 2 weeks. He needs to get back to his exercise. He denies any chest pain or shortness of breath. He's interested in a recheck of his cholesterol today.  12/20/2015  Lee Mclaughlin returns to the office today for follow-up. Overall he seems to be doing really well. He underwent right knee replacement surgery and says that he has had marked improvement in that. His gait is improved, his low back pain is improved. In fact he says he put on about 95 miles of walking during a three-week trip to [place] that he recently spent. He denies any chest pain or worsening shortness of breath. He walked up several 100 stairs and the [facility] without shortness of breath or chest pain.  09/24/2018  Lee Mclaughlin is seen today in follow-up.  I last saw him in 2017.  Unfortunately recently he was undergoing cataract surgery was found to be tachycardic.  EKG demonstrated atrial flutter with rapid ventricular response.  He was generally unaware this.  He may have had some palpitations or anxiety at night prior to this.  He is also been noted to be a little more short of  breath and fatigued easier but is able to play golf without any limitations.  He was seen by Roderic Palau in the A. fib clinic.  He was started on Eliquis 5 mg twice daily on October 2.  Rate control was not ideal the time and his metoprolol was increased to 75 mg twice daily.  Heart rate today is 109.  He said just up until yesterday the rate was not well controlled.  He is generally asymptomatic.  He does have an upcoming appointment with Dr. Caryl Comes to discuss  an A. fib ablation next week.  12/27/2018  Lee Mclaughlin is seen today in follow-up. He continues to do well after recent atrial flutter ablation. Followed up with Dr. Lovena Le a couple of weeks ago.  He was maintaining sinus rhythm.  He has been subsequently taken off of Eliquis.  He denies any recurrent palpitations.  He is contemplating hip replacement of the right hip in April possibly.  From a cardiac standpoint he is at acceptable risk for the procedure.  11/22/2019  Lee Mclaughlin returns today for follow-up.  I last saw him via virtual visit in 2020.  He has a history of coronary artery disease as described in his status post CABG as well as a history of atrial flutter status post ablation without recurrence.  He had been transitioned to [place] which was working well however said he had been off the medicine for a while because of cost issues and then recently restarted some over this past month.  He will likely need reauthorization of the medicine and an updated lipid profile since his last labs were in July 2020.  Unfortunately has had progressive back pain issues and is scheduled for lumbar fusion on December 21.  He is here for cardiac clearance.  He denies any chest pain or shortness of breath and has been very active although limited somewhat by sciatica.  PMHx:  Past Medical History:  Diagnosis Date   Coronary artery disease    COVID-19    In december 2020   Dyslipidemia    Dysrhythmia    a flutter   GERD (gastroesophageal reflux disease)    H/O cardiovascular stress test 10/19/2012   MET test - good effort, peak VO2 of almost 112% predicted, HR 79% predicted, mildly ischemia at end of exercise   History of hyperthyroidism as a child    History of nuclear stress test 03/2011   bruce myoview; mild perfusion defect in mid anteroseptal, apical septal, apical regions (infarct/scar/breast attenuation); post-stress EF 55%; no signficant ischemia   Ischemic cardiomyopathy    history of    Myocardial  infarction ([place])    Pneumonia    as a kid   PONV (postoperative nausea and vomiting)    Primary localized osteoarthritis of right hip 05/24/2019   Primary localized osteoarthritis of right knee    Restless leg    right total hip arthroplasty with dislocation of hip ([place]) 08/14/2019   S/P CABG (coronary artery bypass graft) 2000   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 Gerrie Nordmann)    [place]     COLONOSCOPY     CORONARY ARTERY BYPASS GRAFT  06/05/1999   LIMA to LAD, SVG to diagonal 1, SVG to PDA of Cfx, right radial graft to OM1 (Dr. Remus Loffler)    ESOPHAGOGASTRODUODENOSCOPY     EYE SURGERY     KNEE SURGERY Right    pt. states x 6    TOTAL HIP ARTHROPLASTY Right 05/24/2019   Procedure: TOTAL HIP ARTHROPLASTY;  Surgeon: Marchia Bond, MD;  Location: [facility];  Service: Orthopedics;  Laterality: Right;   TOTAL HIP REVISION Right 03/15/2020   Procedure: TOTAL HIP REVISION, posterior approach;  Surgeon: Paralee Cancel, MD;  Location: [facility];  Service: Orthopedics;  Laterality: Right;  90 mins posterior approach   TOTAL KNEE ARTHROPLASTY Right 07/23/2015   Procedure: RIGHT TOTAL KNEE ARTHROPLASTY;  Surgeon: Elsie Saas, MD;  Location: [facility];  Service: Orthopedics;  Laterality: Right;   TRANSTHORACIC ECHOCARDIOGRAM  02/2008   EF 40%, severe apical wall hypokinesis, mod-severe anterior wall hypokinesis; RV  mildly dilated; mild mitral annular calcif; mild TR, RSVP 30-35mHg    FAMHx:  Family History  Problem Relation Age of Onset   CAD Father        s/p CABGx4 at 734  Hyperlipidemia Father     SOCHx:   reports that he has been smoking cigars. He has never used smokeless tobacco. He reports current alcohol use. He reports that he does not use drugs.  ALLERGIES:  Allergies  Allergen Reactions   Morphine And Related Nausea And Vomiting   Crestor [Rosuvastatin] Other (See Comments)    Myalgias    Vytorin [Ezetimibe-Simvastatin] Other (See Comments)    Myalgias     ROS: Pertinent items noted in HPI and remainder of comprehensive ROS otherwise negative.  HOME MEDS: Current Outpatient Medications  Medication Sig Dispense Refill   albuterol (VENTOLIN HFA) 108 (90 Base) MCG/ACT inhaler Inhale 2 puffs into the lungs every 6 (six) hours as needed for wheezing or shortness of breath.     carvedilol (COREG) 12.5 MG tablet Take 12.5 mg by mouth 2 (two) times daily with a meal.     Evolocumab (REPATHA SURECLICK) 1403MG/ML SOAJ Inject 140 mg into the skin every 14 (fourteen) days. 6 mL 3   gabapentin (NEURONTIN) 600 MG tablet Take 1 tablet (600 mg total) by mouth at bedtime. (Patient taking differently: Take 300-600 mg by mouth See admin instructions. 300 mg daily as needed, 600 mg at bedtime) 90 tablet 3   hydrOXYzine (ATARAX) 25 MG tablet Take 25 mg by mouth at bedtime as needed (sleep).     loratadine (CLARITIN) 10 MG tablet Take 10 mg by mouth daily.     olmesartan (BENICAR) 40 MG tablet Take 40 mg by mouth every morning.      tiZANidine (ZANAFLEX) 4 MG tablet Take 4 mg by mouth every evening.     No current facility-administered medications for this visit.    LABS/IMAGING: No results found for this or any previous visit (from the past 48 hour(s)). No results found.  VITALS: BP 126/72    Pulse 71    Ht '5\' 8"'  (1.727 m)    Wt 238 lb 3.2 oz (108 kg)    SpO2 97%    BMI 36.22 kg/m    EXAM: General appearance: alert and no distress Neck: no carotid  bruit and no JVD Lungs: clear to auscultation bilaterally Heart: regular rate and rhythm, S1, S2 normal, no murmur, click, rub or gallop Abdomen: soft, non-tender; bowel sounds normal; no masses,  no organomegaly Extremities: extremities normal, atraumatic, no cyanosis or edema Pulses: 2+ and symmetric Skin: Skin color, texture, turgor normal. No rashes or lesions Neurologic: Grossly normal Psych: Mood, affect normal  EKG: Sinus rhythm first-degree AV block, poor R wave progression anteriorly at 71-personally reviewed  ASSESSMENT: Atrial flutter s/p ablation - off anticoagulation Coronary artery disease status post four-vessel CABG in 2000 Dyslipidemia-intolerant to statins Possible gout History of ischemic cardiomyopathy, EF 40% now improved to 55% Status post right TKR Acceptable risk for lumbar fusion  PLAN: 1.   Lee Mclaughlin seems to be doing well and is asymptomatic denying chest pain or worsening shortness of breath.  He has had prior CABG in 2000 and this testing last in 2016 which was low risk.  Subsequently he had total hip replacement and knee replacement without any operative issues.  He is now scheduled to have spinal fusion.  He had atrial flutter but it does not appear that is recurred.  From a cardiac standpoint, I think he is at acceptable risk for surgery without further testing as he has been asymptomatic with exertion and can do more than 4 metabolic equivalents of activity.  He does need repeat lipid testing and probably new reauthorization for [place] to get back on the medication.  He is currently holding aspirin for his surgery but should restart it afterwards once he is at acceptable risk from a bleeding standpoint.  Plan follow-up with me annually or sooner as necessary.  Pixie Casino, MD, [facility], [place] Director of the Advanced Lipid Disorders &   Cardiovascular Risk Reduction Clinic Diplomate of the American Board of Clinical Lipidology Attending Cardiologist  Direct Dial: [phone]   Fax: [phone]  Website:  [email or web address] 11/21/2021, 1:47 PM

## 2021-11-22 NOTE — Progress Notes (Signed)
Surgical Instructions    Your procedure is scheduled on 11/27/21.  Report to [facility] Main Entrance "A" at 11:45 A.M., then check in with the Admitting office.  Call this number if you have problems the morning of surgery:  [phone]   If you have any questions prior to your surgery date call [phone]: Open Monday-Friday 8am-4pm    Remember:  Do not eat after midnight the night before your surgery  You may drink clear liquids until 11:45 the morning of your surgery.   Clear liquids allowed are: Water, Non-Citrus Juices (without pulp), Carbonated Beverages, Clear Tea, Black Coffee ONLY (NO MILK, CREAM OR POWDERED CREAMER of any kind), and Gatorade  Please complete your PRE-SURGERY ENSURE that was provided to you by 11:45 the morning of surgery.  Please, if able, drink it in one setting. DO NOT SIP.     Take these medicines the morning of surgery with A SIP OF WATER: carvedilol (COREG)  loratadine (CLARITIN)  If Needed: albuterol (VENTOLIN HFA)  gabapentin (NEURONTIN)    As of today, STOP taking any Aspirin (unless otherwise instructed by your surgeon) Aleve, Naproxen, Ibuprofen, Motrin, Advil, Goody's, BC's, all herbal medications, fish oil, and all vitamins.    After your COVID test   You are not required to quarantine however you are required to wear a well-fitting mask when you are out and around people not in your household.  If your mask becomes wet or soiled, replace with a new one.  Wash your hands often with soap and water for 20 seconds or clean your hands with an alcohol-based hand sanitizer that contains at least 60% alcohol.  Do not share personal items.  Notify your provider: if you are in close contact with someone who has COVID  or if you develop a fever of 100.4 or greater, sneezing, cough, sore throat, shortness of breath or body aches.       Do not wear jewelry  Do not wear lotions, powders, colognes, or deodorant. Do not shave 48 hours prior  to surgery.  Men may shave face and neck. Do not bring valuables to the hospital.              [facility] is not responsible for any belongings or valuables.  Do NOT Smoke (Tobacco/Vaping)  24 hours prior to your procedure  If you use a CPAP at night, you may bring your mask for your overnight stay.   Contacts, glasses, hearing aids, dentures or partials may not be worn into surgery, please bring cases for these belongings   For patients admitted to the hospital, discharge time will be determined by your treatment team.   Patients discharged the day of surgery will not be allowed to drive home, and someone needs to stay with them for 24 hours.  NO VISITORS WILL BE ALLOWED IN PRE-OP WHERE PATIENTS ARE PREPPED FOR SURGERY.  ONLY 1 SUPPORT PERSON MAY BE PRESENT IN THE WAITING ROOM WHILE YOU ARE IN SURGERY.  IF YOU ARE TO BE ADMITTED, ONCE YOU ARE IN YOUR ROOM YOU WILL BE ALLOWED TWO (2) VISITORS. 1 (ONE) VISITOR MAY STAY OVERNIGHT BUT MUST ARRIVE TO THE ROOM BY 8pm.  Minor children may have two parents present. Special consideration for safety and communication needs will be reviewed on a case by case basis.  Special instructions:    Oral Hygiene is also important to reduce your risk of infection.  Remember - BRUSH YOUR TEETH THE MORNING OF SURGERY WITH YOUR REGULAR  TOOTHPASTE   [facility]- Preparing For Surgery  Before surgery, you can play an important role. Because skin is not sterile, your skin needs to be as free of germs as possible. You can reduce the number of germs on your skin by washing with CHG (chlorahexidine gluconate) Soap before surgery.  CHG is an antiseptic cleaner which kills germs and bonds with the skin to continue killing germs even after washing.     Please do not use if you have an allergy to CHG or antibacterial soaps. If your skin becomes reddened/irritated stop using the CHG.  Do not shave (including legs and underarms) for at least 48 hours prior to first CHG  shower. It is OK to shave your face.  Please follow these instructions carefully.     Shower the NIGHT BEFORE SURGERY and the MORNING OF SURGERY with CHG Soap.   If you chose to wash your hair, wash your hair first as usual with your normal shampoo. After you shampoo, rinse your hair and body thoroughly to remove the shampoo.  Then [employer] and genitals (private parts) with your normal soap and rinse thoroughly to remove soap.  After that Use CHG Soap as you would any other liquid soap. You can apply CHG directly to the skin and wash gently with a scrungie or a clean washcloth.   Apply the CHG Soap to your body ONLY FROM THE NECK DOWN.  Do not use on open wounds or open sores. Avoid contact with your eyes, ears, mouth and genitals (private parts). Wash Face and genitals (private parts)  with your normal soap.   Wash thoroughly, paying special attention to the area where your surgery will be performed.  Thoroughly rinse your body with warm water from the neck down.  DO NOT shower/wash with your normal soap after using and rinsing off the CHG Soap.  Pat yourself dry with a CLEAN TOWEL.  Wear CLEAN PAJAMAS to bed the night before surgery  Place CLEAN SHEETS on your bed the night before your surgery  DO NOT SLEEP WITH PETS.   Day of Surgery:  Take a shower with CHG soap. Wear Clean/Comfortable clothing the morning of surgery Do not apply any deodorants/lotions.   Remember to brush your teeth WITH YOUR REGULAR TOOTHPASTE.   Please read over the following fact sheets that you were given.

## 2021-11-25 ENCOUNTER — Encounter (HOSPITAL_COMMUNITY): Payer: Self-pay

## 2021-11-25 ENCOUNTER — Other Ambulatory Visit: Payer: Self-pay

## 2021-11-25 ENCOUNTER — Encounter (HOSPITAL_COMMUNITY)
Admission: RE | Admit: 2021-11-25 | Discharge: 2021-11-25 | Disposition: A | Discharge status: Home / Self Care | Payer: PPO | Source: Ambulatory Visit | Attending: Orthopedic Surgery | Admitting: Orthopedic Surgery

## 2021-11-25 VITALS — BP 104/71 | HR 69 | Temp 98.0°F | Resp 18 | Ht 68.0 in | Wt 238.0 lb

## 2021-11-25 DIAGNOSIS — K219 Gastro-esophageal reflux disease without esophagitis: Secondary | ICD-10-CM | POA: Insufficient documentation

## 2021-11-25 DIAGNOSIS — Z01812 Encounter for preprocedural laboratory examination: Secondary | ICD-10-CM | POA: Insufficient documentation

## 2021-11-25 DIAGNOSIS — M79605 Pain in left leg: Secondary | ICD-10-CM | POA: Insufficient documentation

## 2021-11-25 DIAGNOSIS — E785 Hyperlipidemia, unspecified: Secondary | ICD-10-CM | POA: Insufficient documentation

## 2021-11-25 DIAGNOSIS — Z8616 Personal history of COVID-19: Secondary | ICD-10-CM | POA: Insufficient documentation

## 2021-11-25 DIAGNOSIS — Z951 Presence of aortocoronary bypass graft: Secondary | ICD-10-CM | POA: Insufficient documentation

## 2021-11-25 DIAGNOSIS — F17210 Nicotine dependence, cigarettes, uncomplicated: Secondary | ICD-10-CM | POA: Insufficient documentation

## 2021-11-25 DIAGNOSIS — Z20822 Contact with and (suspected) exposure to covid-19: Secondary | ICD-10-CM | POA: Insufficient documentation

## 2021-11-25 DIAGNOSIS — M79604 Pain in right leg: Secondary | ICD-10-CM | POA: Insufficient documentation

## 2021-11-25 DIAGNOSIS — I2510 Atherosclerotic heart disease of native coronary artery without angina pectoris: Secondary | ICD-10-CM | POA: Insufficient documentation

## 2021-11-25 DIAGNOSIS — Z01818 Encounter for other preprocedural examination: Secondary | ICD-10-CM

## 2021-11-25 DIAGNOSIS — I255 Ischemic cardiomyopathy: Secondary | ICD-10-CM | POA: Insufficient documentation

## 2021-11-25 LAB — CBC WITH DIFFERENTIAL/PLATELET
Abs Immature Granulocytes: 0.04 10*3/uL (ref 0.00–0.07)
Basophils Absolute: 0.1 10*3/uL (ref 0.0–0.1)
Basophils Relative: 1 %
Eosinophils Absolute: 0.4 10*3/uL (ref 0.0–0.5)
Eosinophils Relative: 5 %
HCT: 51.4 % (ref 39.0–52.0)
Hemoglobin: 17.5 g/dL — ABNORMAL HIGH (ref 13.0–17.0)
Immature Granulocytes: 0 %
Lymphocytes Relative: 45 %
Lymphs Abs: 4.1 10*3/uL — ABNORMAL HIGH (ref 0.7–4.0)
MCH: 32.5 pg (ref 26.0–34.0)
MCHC: 34 g/dL (ref 30.0–36.0)
MCV: 95.5 fL (ref 80.0–100.0)
Monocytes Absolute: 0.8 10*3/uL (ref 0.1–1.0)
Monocytes Relative: 9 %
Neutro Abs: 3.6 10*3/uL (ref 1.7–7.7)
Neutrophils Relative %: 40 %
Platelets: 159 10*3/uL (ref 150–400)
RBC: 5.38 MIL/uL (ref 4.22–5.81)
RDW: 12.4 % (ref 11.5–15.5)
WBC: 9 10*3/uL (ref 4.0–10.5)
nRBC: 0 % (ref 0.0–0.2)

## 2021-11-25 LAB — COMPREHENSIVE METABOLIC PANEL
ALT: 31 U/L (ref 0–44)
AST: 29 U/L (ref 15–41)
Albumin: 4.2 g/dL (ref 3.5–5.0)
Alkaline Phosphatase: 34 U/L — ABNORMAL LOW (ref 38–126)
Anion gap: 9 (ref 5–15)
BUN: 32 mg/dL — ABNORMAL HIGH (ref 8–23)
CO2: 25 mmol/L (ref 22–32)
Calcium: 10 mg/dL (ref 8.9–10.3)
Chloride: 103 mmol/L (ref 98–111)
Creatinine, Ser: 1.12 mg/dL (ref 0.61–1.24)
GFR, Estimated: >60 mL/min (ref >60)
Glucose, Bld: 107 mg/dL — ABNORMAL HIGH (ref 70–99)
Potassium: 4.4 mmol/L (ref 3.5–5.1)
Sodium: 137 mmol/L (ref 135–145)
Total Bilirubin: 1.3 mg/dL — ABNORMAL HIGH (ref 0.3–1.2)
Total Protein: 7.3 g/dL (ref 6.5–8.1)

## 2021-11-25 LAB — URINALYSIS, ROUTINE W REFLEX MICROSCOPIC
Bilirubin Urine: NEGATIVE
Glucose, UA: NEGATIVE mg/dL
Ketones, ur: NEGATIVE mg/dL
Nitrite: NEGATIVE
Protein, ur: NEGATIVE mg/dL
Specific Gravity, Urine: 1.025 (ref 1.005–1.030)
pH: 5.5 (ref 5.0–8.0)

## 2021-11-25 LAB — URINALYSIS, MICROSCOPIC (REFLEX)

## 2021-11-25 LAB — SURGICAL PCR SCREEN
MRSA, PCR: NEGATIVE
Staphylococcus aureus: NEGATIVE

## 2021-11-25 LAB — TYPE AND SCREEN
ABO/RH(D): AB POS
Antibody Screen: NEGATIVE

## 2021-11-25 LAB — APTT: aPTT: 30 seconds (ref 24–36)

## 2021-11-25 LAB — PROTIME-INR
INR: 1 (ref 0.8–1.2)
Prothrombin Time: 13.3 seconds (ref 11.4–15.2)

## 2021-11-25 NOTE — Progress Notes (Signed)
Left message for Dr. Marshell Levan scheduler [phone] regarding UA/urine culture results.

## 2021-11-25 NOTE — Progress Notes (Signed)
.  PCP: Benedetto Goad, MD Cardiologist: Zoila Shutter, MD  EKG: 11/21/21 CXR: 06/14/08 ECHO: 02/24/20 Stress Test: 05/25/15 Cardiac Cath: 06/01/07  Fasting Blood Sugar- na Checks Blood Sugar_na__ times a day  ASA/Blood Thinner: No  OSA/CPAP: No  Covid test 11/25/21 at PAT  Anesthesia Review: Yes, cardiac history  Patient denies shortness of breath, fever, cough, and chest pain at PAT appointment.  Patient verbalized understanding of instructions provided today at the PAT appointment.  Patient asked to review instructions at home and day of surgery.

## 2021-11-26 LAB — SARS CORONAVIRUS 2 (TAT 6-24 HRS): SARS Coronavirus 2: NEGATIVE

## 2021-11-26 NOTE — Anesthesia Preprocedure Evaluation (Addendum)
Anesthesia Evaluation  Patient identified by MRN, date of birth, ID band Patient awake    Reviewed: Allergy & Precautions, H&P , NPO status , Patient's Chart, lab work & pertinent test results, reviewed documented beta blocker date and time   History of Anesthesia Complications (+) PONV  Airway Mallampati: III  TM Distance: >3 FB Neck ROM: Full    Dental no notable dental hx. (+) Chipped,    Pulmonary neg pulmonary ROS, Current Smoker and Patient abstained from smoking.,    Pulmonary exam normal breath sounds clear to auscultation       Cardiovascular + CAD, + Past MI and + CABG   Rhythm:Regular Rate:Normal     Neuro/Psych negative neurological ROS  negative psych ROS   GI/Hepatic Neg liver ROS, GERD  ,  Endo/Other  Morbid obesity  Renal/GU negative Renal ROS  negative genitourinary   Musculoskeletal  (+) Arthritis , Osteoarthritis,    Abdominal   Peds  Hematology negative hematology ROS (+)   Anesthesia Other Findings   Reproductive/Obstetrics negative OB ROS                           Anesthesia Physical Anesthesia Plan  ASA: 3  Anesthesia Plan: General   Post-op Pain Management: Tylenol PO (pre-op)   Induction: Intravenous  PONV Risk Score and Plan: 3 and Ondansetron, Dexamethasone and Midazolam  Airway Management Planned: Oral ETT  Additional Equipment:   Intra-op Plan:   Post-operative Plan: Extubation in OR  Informed Consent: I have reviewed the patients History and Physical, chart, labs and discussed the procedure including the risks, benefits and alternatives for the proposed anesthesia with the patient or authorized representative who has indicated his/her understanding and acceptance.     Dental advisory given  Plan Discussed with: CRNA  Anesthesia Plan Comments: (PAT note written 11/26/2021 by Shonna Chock, PA-C. )       Anesthesia Quick  Evaluation

## 2021-11-26 NOTE — Progress Notes (Signed)
Anesthesia Chart Review:  Case: [id number] Date/Time: 11/27/21 1431   Procedure: LEFT-SIDED LUMBAR 3- LUMBAR 4, LUMBAR 4 - LUMBAR 5 TRANSFORAMINAL LUMBAR INTERBODY FUSION WITH INSTRUMENTATION AND ALLOGRAFT (Left)   Anesthesia type: General   Pre-op diagnosis: BILATERAL LEG PAIN   Location: [facility] ROOM 05 / [facility]   Surgeons: Phylliss Bob, MD       DISCUSSION: Patient is a 70 year old male scheduled for the above procedure.  History includes smoking, post-operative N/V, CAD (s/p CABG 2000: "LIMA to LAD, SVG to T1, SVG to PDA (left dominant), free radial graft to OM1"), ischemic cardiomyopathy, dyslipidemia, aflutter (s/p ablation 10/01/18 & 11/02/18), GERD, COVID-19 (11/2019), childhood hyperthyroidism, spinal surgery (C5-7 ACDF 06/16/08), TKA (right 07/23/15), right knee infection (2002), THA (right 05/24/19, revision 03/15/20).  BMI is consistent with obesity.   Patient evaluated by Dr. Debara Pickett on 11/21/21 for preoperative cardiology evaluation. He wrote, " Lee Mclaughlin seems to be doing well and is asymptomatic denying chest pain or worsening shortness of breath.  He has had prior CABG in 2000 and this testing last in 2016 which was low risk.  Subsequently he had total hip replacement and knee replacement without any operative issues.  He is now scheduled to have spinal fusion.  He had atrial flutter but it does not appear that is recurred.  From a cardiac standpoint, I think he is at acceptable risk for surgery without further testing as he has been asymptomatic with exertion and can do more than 4 metabolic equivalents of activity."  11/25/2021 presurgical COVID-19 test negative.  Anesthesia team to evaluate on the day of surgery.   VS: BP 104/71    Pulse 69    Temp 36.7 C (Oral)    Resp 18    Ht '5\' 8"'  (1.727 m)    Wt 108 kg    SpO2 98%    BMI 36.19 kg/m    PROVIDERS: [name], MD is PCP  Lyman Bishop, MD is cardiologist Cristopher Peru, MD is EP cardiologist  Sherrilyn Rist, MD is  pulmonologist. Last visit 10/04/21 for follow-up RLS, associated sleep maintenance insomnia, snoring. Consider repeat sleep study once able to achieve better quality of sleep.  Jerrell Belfast, MD is ENT ([facility])   LABS: Labs reviewed: Acceptable for surgery. (all labs ordered are listed, but only abnormal results are displayed)  Labs Reviewed  CBC WITH DIFFERENTIAL/PLATELET - Abnormal; Notable for the following components:      Result Value   Hemoglobin 17.5 (*)    Lymphs Abs 4.1 (*)    All other components within normal limits  COMPREHENSIVE METABOLIC PANEL - Abnormal; Notable for the following components:   Glucose, Bld 107 (*)    BUN 32 (*)    Alkaline Phosphatase 34 (*)    Total Bilirubin 1.3 (*)    All other components within normal limits  URINALYSIS, ROUTINE W REFLEX MICROSCOPIC - Abnormal; Notable for the following components:   Hgb urine dipstick TRACE (*)    Leukocytes,Ua SMALL (*)    All other components within normal limits  URINALYSIS, MICROSCOPIC (REFLEX) - Abnormal; Notable for the following components:   Bacteria, UA RARE (*)    All other components within normal limits  SURGICAL PCR SCREEN  SARS CORONAVIRUS 2 (TAT 6-24 HRS)  APTT  PROTIME-INR  TYPE AND SCREEN    IMAGES: MRI L-spine 08/15/21 ([facility]): IMPRESSION: 1. New marrow edema in the right greater than left L4 pedicles is likely multifactorial and related to advanced L3-L4 facet  arthropathy and stress injury. Suspected stress fracture on the right. 2. Multilevel lumbar spondylosis as described above, mildly progressed at L1-L2 and L3-L4. 3. Slightly worsened moderate to severe spinal canal stenosis at L3-L4. 4. Unchanged severe spinal canal stenosis and moderate to severe right neuroforaminal stenosis at L4-L5.  CT Neck/Thyroid 01/10/21 ([facility]): Impression: 1.  Multiple enlarged lymph nodes of the right greater than left neck, notably level I and II lymph nodes as described above without  evidence for necrotic or suppurative change. Differential considerations include reactive nodes versus lymphoproliferative disease. Consider tissue sampling for further assessment if there is persistent lymphadenopathy.  2.  No findings specific for a primary neoplasm along the visualized aerodigestive tract.   EKG: 11/21/2020  Sinus rhythm with first-degree AV block Anteroseptal infarct, age undetermined   CV: Echo 02/24/2020 IMPRESSIONS   1. Left ventricular ejection fraction, by estimation, is 55 to 60%. The  left ventricle has normal function. The left ventricle has no regional  wall motion abnormalities. Left ventricular diastolic parameters are  indeterminate.   2. Right ventricular systolic function is normal. The right ventricular  size is normal.   3. The mitral valve is abnormal. Mild mitral valve regurgitation.   4. The aortic valve is abnormal. Aortic valve regurgitation is not  visualized. Mild to moderate aortic valve sclerosis/calcification is  present, without any evidence of aortic stenosis.   5. The inferior vena cava is normal in size with greater than 50%  respiratory variability, suggesting right atrial pressure of 3 mmHg.  - Comparison(s): The left ventricular function has improved. Compared to previous echo, LVEF has improved.    Myocardial Perfusion Imaging 05/24/2015 Low risk pharmacological stress nuclear study with a small and mild apicoseptal fixed perfusion defect and normal left ventricular regional and global systolic function.   Cardiac cath 06/01/2007 Conclusion: Significant three-vessel coronary artery disease. Patent left internal mammary artery to the left anterior descending artery with no significant disease beyond the internal mammary artery insertion. Patent vein graft to the diagonal with no significant disease in the graft or distal to the graft insertion. Patent radial graft to the second obtuse marginal with mild 50% ostial narrowing of the  graft but no further significant disease in the graft or distal to the graft insertion. Patent sequential vein graft to posterior descending artery and posterior lateral branch both originating off the circumflex artery.  There is no significant disease in the body of the graft or distal to the graft insertion. Mildly depressed left ventricular systolic function wall motion abnormality with ejection fraction of 35% with moderate anterior lateral and apical hypokinesis. Elevated left ventricular end-diastolic pressure.   Past Medical History:  Diagnosis Date   Coronary artery disease    COVID-19    In december 2020   Dyslipidemia    Dysrhythmia    a flutter   GERD (gastroesophageal reflux disease)    H/O cardiovascular stress test 10/19/2012   MET test - good effort, peak VO2 of almost 112% predicted, HR 79% predicted, mildly ischemia at end of exercise   History of hyperthyroidism as a child    History of nuclear stress test 03/2011   bruce myoview; mild perfusion defect in mid anteroseptal, apical septal, apical regions (infarct/scar/breast attenuation); post-stress EF 55%; no signficant ischemia   Ischemic cardiomyopathy    history of    Myocardial infarction (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 Gerrie Nordmann)    [place]     COLONOSCOPY     CORONARY ARTERY BYPASS GRAFT  06/05/1999   LIMA to LAD, SVG to diagonal 1, SVG to PDA of Cfx, right radial graft to OM1 (Dr. Remus Loffler)    ESOPHAGOGASTRODUODENOSCOPY     EYE SURGERY     KNEE SURGERY Right    pt. states x 6    TOTAL HIP ARTHROPLASTY Right 05/24/2019   Procedure: TOTAL HIP ARTHROPLASTY;  Surgeon: Marchia Bond, MD;  Location: [facility];  Service: Orthopedics;  Laterality: Right;   TOTAL HIP REVISION Right 03/15/2020   Procedure: TOTAL HIP REVISION, posterior approach;  Surgeon: Paralee Cancel, MD;  Location: [facility];  Service: Orthopedics;  Laterality: Right;  90 mins posterior approach   TOTAL KNEE ARTHROPLASTY Right 07/23/2015   Procedure: RIGHT TOTAL KNEE ARTHROPLASTY;  Surgeon: Elsie Saas, MD;  Location: [facility];  Service: Orthopedics;  Laterality: Right;   TRANSTHORACIC ECHOCARDIOGRAM  02/2008   EF 40%, severe apical wall hypokinesis, mod-severe anterior wall hypokinesis; RV mildly dilated; mild mitral annular calcif; mild TR, RSVP 30-44mHg    MEDICATIONS:  albuterol (VENTOLIN HFA) 108 (90 Base) MCG/ACT inhaler   carvedilol (COREG) 12.5 MG tablet   Evolocumab (REPATHA SURECLICK) 1856MG/ML SOAJ   gabapentin (NEURONTIN) 600 MG tablet   hydrOXYzine (ATARAX) 25 MG tablet   loratadine (CLARITIN) 10 MG tablet   olmesartan (BENICAR) 40 MG tablet   tiZANidine (ZANAFLEX) 4 MG tablet   No current facility-administered medications for this encounter.    AMyra Gianotti PA-C Surgical Short Stay/Anesthesiology [facility] CenterPhone [phone] HospitalPhone [phone]/20/2022 2:26 PM

## 2021-11-27 ENCOUNTER — Inpatient Hospital Stay (HOSPITAL_COMMUNITY): Payer: PPO | Admitting: Vascular Surgery

## 2021-11-27 ENCOUNTER — Inpatient Hospital Stay (HOSPITAL_COMMUNITY)
Admission: RE | Admit: 2021-11-27 | Discharge: 2021-11-28 | DRG: 455 | Disposition: A | Discharge status: Home / Self Care | Payer: PPO | Attending: Orthopedic Surgery | Admitting: Orthopedic Surgery

## 2021-11-27 ENCOUNTER — Inpatient Hospital Stay (HOSPITAL_COMMUNITY): Payer: PPO | Admitting: Anesthesiology

## 2021-11-27 ENCOUNTER — Other Ambulatory Visit: Payer: Self-pay

## 2021-11-27 ENCOUNTER — Inpatient Hospital Stay (HOSPITAL_COMMUNITY): Payer: PPO

## 2021-11-27 ENCOUNTER — Inpatient Hospital Stay (HOSPITAL_COMMUNITY)
Admission: RE | Disposition: A | Discharge status: Home / Self Care | Payer: Self-pay | Source: Home / Self Care | Attending: Orthopedic Surgery

## 2021-11-27 ENCOUNTER — Encounter (HOSPITAL_COMMUNITY): Payer: Self-pay | Admitting: Orthopedic Surgery

## 2021-11-27 DIAGNOSIS — I255 Ischemic cardiomyopathy: Secondary | ICD-10-CM | POA: Diagnosis present

## 2021-11-27 DIAGNOSIS — Z419 Encounter for procedure for purposes other than remedying health state, unspecified: Secondary | ICD-10-CM

## 2021-11-27 DIAGNOSIS — Z20822 Contact with and (suspected) exposure to covid-19: Secondary | ICD-10-CM | POA: Diagnosis present

## 2021-11-27 DIAGNOSIS — Z888 Allergy status to other drugs, medicaments and biological substances status: Secondary | ICD-10-CM

## 2021-11-27 DIAGNOSIS — M48061 Spinal stenosis, lumbar region without neurogenic claudication: Secondary | ICD-10-CM | POA: Diagnosis present

## 2021-11-27 DIAGNOSIS — Z8249 Family history of ischemic heart disease and other diseases of the circulatory system: Secondary | ICD-10-CM | POA: Diagnosis not present

## 2021-11-27 DIAGNOSIS — Z951 Presence of aortocoronary bypass graft: Secondary | ICD-10-CM | POA: Diagnosis not present

## 2021-11-27 DIAGNOSIS — Z8616 Personal history of COVID-19: Secondary | ICD-10-CM | POA: Diagnosis not present

## 2021-11-27 DIAGNOSIS — I252 Old myocardial infarction: Secondary | ICD-10-CM

## 2021-11-27 DIAGNOSIS — E669 Obesity, unspecified: Secondary | ICD-10-CM | POA: Diagnosis present

## 2021-11-27 DIAGNOSIS — Z96651 Presence of right artificial knee joint: Secondary | ICD-10-CM | POA: Diagnosis present

## 2021-11-27 DIAGNOSIS — M532X6 Spinal instabilities, lumbar region: Secondary | ICD-10-CM | POA: Diagnosis present

## 2021-11-27 DIAGNOSIS — Z83438 Family history of other disorder of lipoprotein metabolism and other lipidemia: Secondary | ICD-10-CM | POA: Diagnosis not present

## 2021-11-27 DIAGNOSIS — Z96641 Presence of right artificial hip joint: Secondary | ICD-10-CM | POA: Diagnosis present

## 2021-11-27 DIAGNOSIS — Z885 Allergy status to narcotic agent status: Secondary | ICD-10-CM | POA: Diagnosis not present

## 2021-11-27 DIAGNOSIS — Z79899 Other long term (current) drug therapy: Secondary | ICD-10-CM

## 2021-11-27 DIAGNOSIS — E785 Hyperlipidemia, unspecified: Secondary | ICD-10-CM | POA: Diagnosis present

## 2021-11-27 DIAGNOSIS — F17290 Nicotine dependence, other tobacco product, uncomplicated: Secondary | ICD-10-CM | POA: Diagnosis present

## 2021-11-27 DIAGNOSIS — I2510 Atherosclerotic heart disease of native coronary artery without angina pectoris: Secondary | ICD-10-CM | POA: Diagnosis present

## 2021-11-27 DIAGNOSIS — M4316 Spondylolisthesis, lumbar region: Secondary | ICD-10-CM | POA: Diagnosis present

## 2021-11-27 DIAGNOSIS — M2578 Osteophyte, vertebrae: Secondary | ICD-10-CM | POA: Diagnosis not present

## 2021-11-27 DIAGNOSIS — M5416 Radiculopathy, lumbar region: Secondary | ICD-10-CM | POA: Diagnosis present

## 2021-11-27 DIAGNOSIS — Z6836 Body mass index (BMI) 36.0-36.9, adult: Secondary | ICD-10-CM

## 2021-11-27 DIAGNOSIS — K219 Gastro-esophageal reflux disease without esophagitis: Secondary | ICD-10-CM | POA: Diagnosis present

## 2021-11-27 DIAGNOSIS — M4326 Fusion of spine, lumbar region: Secondary | ICD-10-CM | POA: Diagnosis not present

## 2021-11-27 DIAGNOSIS — Z981 Arthrodesis status: Secondary | ICD-10-CM | POA: Diagnosis not present

## 2021-11-27 HISTORY — PX: TRANSFORAMINAL LUMBAR INTERBODY FUSION (TLIF) WITH PEDICLE SCREW FIXATION 2 LEVEL: SHX6142

## 2021-11-27 SURGERY — TRANSFORAMINAL LUMBAR INTERBODY FUSION (TLIF) WITH PEDICLE SCREW FIXATION 2 LEVEL
Anesthesia: General | Laterality: Left

## 2021-11-27 MED ORDER — SODIUM CHLORIDE 0.9 % IV SOLN: 250.0000 mL | INTRAVENOUS | Status: DC

## 2021-11-27 MED ORDER — IRBESARTAN 150 MG PO TABS
300.0000 mg | ORAL_TABLET | Freq: Every day | ORAL | Status: DC
  Administered 2021-11-27 – 2021-11-28 (×2): 300 mg via ORAL
  Filled 2021-11-27 (×2): qty 2

## 2021-11-27 MED ORDER — BUPIVACAINE-EPINEPHRINE (PF) 0.25% -1:200000 IJ SOLN
INTRAMUSCULAR | Status: AC
  Filled 2021-11-27: qty 30

## 2021-11-27 MED ORDER — CEFAZOLIN SODIUM-DEXTROSE 2-4 GM/100ML-% IV SOLN
2.0000 g | INTRAVENOUS | Status: AC
  Administered 2021-11-27 (×2): 2 g via INTRAVENOUS
  Filled 2021-11-27: qty 100

## 2021-11-27 MED ORDER — BISACODYL 5 MG PO TBEC: 5.0000 mg | DELAYED_RELEASE_TABLET | Freq: Every day | ORAL | Status: DC | PRN

## 2021-11-27 MED ORDER — ORAL CARE MOUTH RINSE: 15.0000 mL | Freq: Once | OROMUCOSAL | Status: AC

## 2021-11-27 MED ORDER — ACETAMINOPHEN 325 MG PO TABS: 650.0000 mg | ORAL_TABLET | ORAL | Status: DC | PRN

## 2021-11-27 MED ORDER — GABAPENTIN 300 MG PO CAPS: 300.0000 mg | ORAL_CAPSULE | Freq: Every day | ORAL | Status: DC | PRN

## 2021-11-27 MED ORDER — MIDAZOLAM HCL 2 MG/2ML IJ SOLN
INTRAMUSCULAR | Status: AC
  Filled 2021-11-27: qty 2

## 2021-11-27 MED ORDER — PROPOFOL 10 MG/ML IV BOLUS
INTRAVENOUS | Status: DC | PRN
  Administered 2021-11-27: 160 mg via INTRAVENOUS

## 2021-11-27 MED ORDER — SUCCINYLCHOLINE CHLORIDE 200 MG/10ML IV SOSY
PREFILLED_SYRINGE | INTRAVENOUS | Status: AC
  Filled 2021-11-27: qty 10

## 2021-11-27 MED ORDER — CHLORHEXIDINE GLUCONATE 0.12 % MT SOLN: 15.0000 mL | Freq: Once | OROMUCOSAL | Status: AC

## 2021-11-27 MED ORDER — MENTHOL 3 MG MT LOZG: 1.0000 | LOZENGE | OROMUCOSAL | Status: DC | PRN

## 2021-11-27 MED ORDER — METHOCARBAMOL 500 MG PO TABS
500.0000 mg | ORAL_TABLET | Freq: Four times a day (QID) | ORAL | Status: DC | PRN
  Administered 2021-11-27 – 2021-11-28 (×2): 500 mg via ORAL
  Filled 2021-11-27 (×2): qty 1

## 2021-11-27 MED ORDER — THROMBIN 20000 UNITS EX SOLR
CUTANEOUS | Status: DC | PRN
  Administered 2021-11-27: 13:00:00 20000 [IU] via TOPICAL

## 2021-11-27 MED ORDER — BUPIVACAINE LIPOSOME 1.3 % IJ SUSP
INTRAMUSCULAR | Status: DC | PRN
  Administered 2021-11-27: 20 mL

## 2021-11-27 MED ORDER — CHLORHEXIDINE GLUCONATE 0.12 % MT SOLN
OROMUCOSAL | Status: AC
  Administered 2021-11-27: 12:00:00 15 mL via OROMUCOSAL
  Filled 2021-11-27: qty 15

## 2021-11-27 MED ORDER — POTASSIUM CHLORIDE IN NACL 20-0.9 MEQ/L-% IV SOLN: INTRAVENOUS | Status: DC

## 2021-11-27 MED ORDER — OXYCODONE-ACETAMINOPHEN 5-325 MG PO TABS
1.0000 | ORAL_TABLET | ORAL | Status: DC | PRN
Start: 2021-11-27 — End: 2021-11-28
  Administered 2021-11-27 – 2021-11-28 (×4): 2 via ORAL
  Filled 2021-11-27 (×4): qty 2

## 2021-11-27 MED ORDER — CEFAZOLIN SODIUM 1 G IJ SOLR
INTRAMUSCULAR | Status: AC
  Filled 2021-11-27: qty 40

## 2021-11-27 MED ORDER — DEXAMETHASONE SODIUM PHOSPHATE 10 MG/ML IJ SOLN
INTRAMUSCULAR | Status: DC | PRN
  Administered 2021-11-27: 10 mg via INTRAVENOUS

## 2021-11-27 MED ORDER — DEXAMETHASONE SODIUM PHOSPHATE 10 MG/ML IJ SOLN
INTRAMUSCULAR | Status: AC
  Filled 2021-11-27: qty 2

## 2021-11-27 MED ORDER — BUPIVACAINE LIPOSOME 1.3 % IJ SUSP
INTRAMUSCULAR | Status: AC
  Filled 2021-11-27: qty 20

## 2021-11-27 MED ORDER — BUPIVACAINE-EPINEPHRINE 0.25% -1:200000 IJ SOLN
INTRAMUSCULAR | Status: DC | PRN
  Administered 2021-11-27: 30 mL

## 2021-11-27 MED ORDER — SENNOSIDES-DOCUSATE SODIUM 8.6-50 MG PO TABS
1.0000 | ORAL_TABLET | Freq: Every evening | ORAL | Status: DC | PRN
  Administered 2021-11-27: 20:00:00 1 via ORAL
  Filled 2021-11-27: qty 1

## 2021-11-27 MED ORDER — HYDROMORPHONE HCL 1 MG/ML IJ SOLN
INTRAMUSCULAR | Status: AC
  Filled 2021-11-27: qty 1

## 2021-11-27 MED ORDER — ROCURONIUM BROMIDE 10 MG/ML (PF) SYRINGE
PREFILLED_SYRINGE | INTRAVENOUS | Status: DC | PRN
Start: 2021-11-27 — End: 2021-11-27
  Administered 2021-11-27: 80 mg via INTRAVENOUS
  Administered 2021-11-27: 30 mg via INTRAVENOUS
  Administered 2021-11-27: 20 mg via INTRAVENOUS
  Administered 2021-11-27: 30 mg via INTRAVENOUS
  Administered 2021-11-27: 70 mg via INTRAVENOUS

## 2021-11-27 MED ORDER — LACTATED RINGERS IV SOLN: INTRAVENOUS | Status: DC

## 2021-11-27 MED ORDER — HYDROXYZINE HCL 50 MG/ML IM SOLN: 50.0000 mg | Freq: Four times a day (QID) | INTRAMUSCULAR | Status: DC | PRN

## 2021-11-27 MED ORDER — SODIUM CHLORIDE (PF) 0.9 % IJ SOLN
INTRAMUSCULAR | Status: AC
  Filled 2021-11-27: qty 10

## 2021-11-27 MED ORDER — GABAPENTIN 300 MG PO CAPS
600.0000 mg | ORAL_CAPSULE | Freq: Every day | ORAL | Status: DC
  Administered 2021-11-27: 20:00:00 600 mg via ORAL
  Filled 2021-11-27: qty 2

## 2021-11-27 MED ORDER — PHENYLEPHRINE HCL-NACL 20-0.9 MG/250ML-% IV SOLN
INTRAVENOUS | Status: DC | PRN
  Administered 2021-11-27: 50 ug/min via INTRAVENOUS

## 2021-11-27 MED ORDER — LIDOCAINE 2% (20 MG/ML) 5 ML SYRINGE
INTRAMUSCULAR | Status: DC | PRN
  Administered 2021-11-27: 100 mg via INTRAVENOUS

## 2021-11-27 MED ORDER — FENTANYL CITRATE (PF) 250 MCG/5ML IJ SOLN
INTRAMUSCULAR | Status: AC
  Filled 2021-11-27: qty 5

## 2021-11-27 MED ORDER — HYDROXYZINE HCL 25 MG PO TABS: 25.0000 mg | ORAL_TABLET | Freq: Every evening | ORAL | Status: DC | PRN

## 2021-11-27 MED ORDER — EPHEDRINE SULFATE-NACL 50-0.9 MG/10ML-% IV SOSY
PREFILLED_SYRINGE | INTRAVENOUS | Status: DC | PRN
Start: 2021-11-27 — End: 2021-11-27
  Administered 2021-11-27: 15 mg via INTRAVENOUS
  Administered 2021-11-27: 10 mg via INTRAVENOUS

## 2021-11-27 MED ORDER — THROMBIN 20000 UNITS EX KIT
PACK | CUTANEOUS | Status: AC
  Filled 2021-11-27: qty 1

## 2021-11-27 MED ORDER — ONDANSETRON HCL 4 MG/2ML IJ SOLN
INTRAMUSCULAR | Status: AC
  Filled 2021-11-27: qty 4

## 2021-11-27 MED ORDER — ACETAMINOPHEN 650 MG RE SUPP: 650.0000 mg | RECTAL | Status: DC | PRN

## 2021-11-27 MED ORDER — EPHEDRINE 5 MG/ML INJ
INTRAVENOUS | Status: AC
  Filled 2021-11-27: qty 10

## 2021-11-27 MED ORDER — CARVEDILOL 12.5 MG PO TABS
12.5000 mg | ORAL_TABLET | Freq: Two times a day (BID) | ORAL | Status: DC
  Administered 2021-11-27 – 2021-11-28 (×2): 12.5 mg via ORAL
  Filled 2021-11-27 (×2): qty 1

## 2021-11-27 MED ORDER — PHENOL 1.4 % MT LIQD: 1.0000 | OROMUCOSAL | Status: DC | PRN

## 2021-11-27 MED ORDER — MIDAZOLAM HCL 2 MG/2ML IJ SOLN
INTRAMUSCULAR | Status: DC | PRN
  Administered 2021-11-27: 2 mg via INTRAVENOUS

## 2021-11-27 MED ORDER — HYDROCODONE-ACETAMINOPHEN 5-325 MG PO TABS: 1.0000 | ORAL_TABLET | ORAL | Status: DC | PRN

## 2021-11-27 MED ORDER — FENTANYL CITRATE (PF) 250 MCG/5ML IJ SOLN
INTRAMUSCULAR | Status: DC | PRN
  Administered 2021-11-27 (×3): 25 ug via INTRAVENOUS
  Administered 2021-11-27: 100 ug via INTRAVENOUS
  Administered 2021-11-27 (×2): 25 ug via INTRAVENOUS
  Administered 2021-11-27: 100 ug via INTRAVENOUS
  Administered 2021-11-27: 25 ug via INTRAVENOUS

## 2021-11-27 MED ORDER — METHOCARBAMOL 1000 MG/10ML IJ SOLN
500.0000 mg | Freq: Four times a day (QID) | INTRAVENOUS | Status: DC | PRN
  Filled 2021-11-27: qty 5

## 2021-11-27 MED ORDER — ONDANSETRON HCL 4 MG/2ML IJ SOLN: 4.0000 mg | Freq: Four times a day (QID) | INTRAMUSCULAR | Status: DC | PRN

## 2021-11-27 MED ORDER — 0.9 % SODIUM CHLORIDE (POUR BTL) OPTIME
TOPICAL | Status: DC | PRN
  Administered 2021-11-27: 13:00:00 1000 mL

## 2021-11-27 MED ORDER — DOCUSATE SODIUM 100 MG PO CAPS
100.0000 mg | ORAL_CAPSULE | Freq: Two times a day (BID) | ORAL | Status: DC
  Administered 2021-11-27 – 2021-11-28 (×2): 100 mg via ORAL
  Filled 2021-11-27 (×2): qty 1

## 2021-11-27 MED ORDER — ONDANSETRON HCL 4 MG PO TABS: 4.0000 mg | ORAL_TABLET | Freq: Four times a day (QID) | ORAL | Status: DC | PRN

## 2021-11-27 MED ORDER — CEFAZOLIN SODIUM-DEXTROSE 2-4 GM/100ML-% IV SOLN
2.0000 g | Freq: Three times a day (TID) | INTRAVENOUS | Status: AC
  Administered 2021-11-27 – 2021-11-28 (×2): 2 g via INTRAVENOUS
  Filled 2021-11-27 (×2): qty 100

## 2021-11-27 MED ORDER — SODIUM CHLORIDE 0.9% FLUSH: 3.0000 mL | INTRAVENOUS | Status: DC | PRN

## 2021-11-27 MED ORDER — HYDROMORPHONE HCL 1 MG/ML IJ SOLN
0.2500 mg | INTRAMUSCULAR | Status: DC | PRN
  Administered 2021-11-27: 18:00:00 0.5 mg via INTRAVENOUS
  Administered 2021-11-27: 18:00:00 0.25 mg via INTRAVENOUS

## 2021-11-27 MED ORDER — ACETAMINOPHEN 500 MG PO TABS
1000.0000 mg | ORAL_TABLET | Freq: Once | ORAL | Status: AC
  Administered 2021-11-27: 12:00:00 1000 mg via ORAL
  Filled 2021-11-27: qty 2

## 2021-11-27 MED ORDER — DEXMEDETOMIDINE (PRECEDEX) IN NS 20 MCG/5ML (4 MCG/ML) IV SYRINGE
PREFILLED_SYRINGE | INTRAVENOUS | Status: AC
  Filled 2021-11-27: qty 5

## 2021-11-27 MED ORDER — ALBUTEROL SULFATE (2.5 MG/3ML) 0.083% IN NEBU: 3.0000 mL | INHALATION_SOLUTION | Freq: Four times a day (QID) | RESPIRATORY_TRACT | Status: DC | PRN

## 2021-11-27 MED ORDER — SODIUM CHLORIDE 0.9% FLUSH: 3.0000 mL | Freq: Two times a day (BID) | INTRAVENOUS | Status: DC

## 2021-11-27 MED ORDER — ROCURONIUM BROMIDE 10 MG/ML (PF) SYRINGE
PREFILLED_SYRINGE | INTRAVENOUS | Status: AC
  Filled 2021-11-27: qty 20

## 2021-11-27 MED ORDER — LORATADINE 10 MG PO TABS
10.0000 mg | ORAL_TABLET | Freq: Every day | ORAL | Status: DC
  Administered 2021-11-28: 10:00:00 10 mg via ORAL
  Filled 2021-11-27: qty 1

## 2021-11-27 MED ORDER — FLEET ENEMA 7-19 GM/118ML RE ENEM: 1.0000 | ENEMA | Freq: Once | RECTAL | Status: DC | PRN

## 2021-11-27 MED ORDER — ALUM & MAG HYDROXIDE-SIMETH 200-200-20 MG/5ML PO SUSP: 30.0000 mL | Freq: Four times a day (QID) | ORAL | Status: DC | PRN

## 2021-11-27 MED ORDER — LIDOCAINE 2% (20 MG/ML) 5 ML SYRINGE
INTRAMUSCULAR | Status: AC
  Filled 2021-11-27: qty 10

## 2021-11-27 MED ORDER — SUGAMMADEX SODIUM 200 MG/2ML IV SOLN
INTRAVENOUS | Status: DC | PRN
  Administered 2021-11-27: 200 mg via INTRAVENOUS
  Administered 2021-11-27: 100 mg via INTRAVENOUS

## 2021-11-27 MED ORDER — ONDANSETRON HCL 4 MG/2ML IJ SOLN
INTRAMUSCULAR | Status: DC | PRN
  Administered 2021-11-27 (×2): 4 mg via INTRAVENOUS

## 2021-11-27 MED ORDER — POVIDONE-IODINE 7.5 % EX SOLN: Freq: Once | CUTANEOUS | Status: DC

## 2021-11-27 MED ORDER — ZOLPIDEM TARTRATE 5 MG PO TABS: 5.0000 mg | ORAL_TABLET | Freq: Every evening | ORAL | Status: DC | PRN

## 2021-11-27 SURGICAL SUPPLY — 92 items
AGENT HMST KT MTR STRL THRMB (HEMOSTASIS)
APL SKNCLS STERI-STRIP NONHPOA (GAUZE/BANDAGES/DRESSINGS) ×1
BAG COUNTER SPONGE SURGICOUNT (BAG) ×3 IMPLANT
BAG SPNG CNTER NS LX DISP (BAG) ×1
BAG SURGICOUNT SPONGE COUNTING (BAG) ×1
BENZOIN TINCTURE PRP APPL 2/3 (GAUZE/BANDAGES/DRESSINGS) ×4 IMPLANT
BLADE CLIPPER SURG (BLADE) IMPLANT
BONE VIVIGEN FORMABLE 10CC (Bone Implant) ×6 IMPLANT
BUR PRESCISION 1.7 ELITE (BURR) ×4 IMPLANT
BUR ROUND FLUTED 5 RND (BURR) ×3 IMPLANT
BUR ROUND FLUTED 5MM RND (BURR) ×1
BUR ROUND PRECISION 4.0 (BURR) IMPLANT
BUR ROUND PRECISION 4.0MM (BURR)
BUR SABER RD CUTTING 3.0 (BURR) IMPLANT
BUR SABER RD CUTTING 3.0MM (BURR)
CAGE SABLE 10X30 9-16 8D (Cage) ×4 IMPLANT
CARTRIDGE OIL MAESTRO DRILL (MISCELLANEOUS) ×2 IMPLANT
CLOSURE WOUND 1/2 X4 (GAUZE/BANDAGES/DRESSINGS) ×2
CNTNR URN SCR LID CUP LEK RST (MISCELLANEOUS) ×2 IMPLANT
CONT SPEC 4OZ STRL OR WHT (MISCELLANEOUS) ×3
COVER MAYO STAND STRL (DRAPES) ×8 IMPLANT
COVER SURGICAL LIGHT HANDLE (MISCELLANEOUS) ×4 IMPLANT
DIFFUSER DRILL AIR PNEUMATIC (MISCELLANEOUS) ×4 IMPLANT
DRAIN CHANNEL 15F RND FF W/TCR (WOUND CARE) IMPLANT
DRAPE C-ARM 42X72 X-RAY (DRAPES) ×4 IMPLANT
DRAPE C-ARMOR (DRAPES) IMPLANT
DRAPE POUCH INSTRU U-SHP 10X18 (DRAPES) ×4 IMPLANT
DRAPE SURG 17X23 STRL (DRAPES) ×16 IMPLANT
DURAPREP 26ML APPLICATOR (WOUND CARE) ×4 IMPLANT
ELECT BLADE 4.0 EZ CLEAN MEGAD (MISCELLANEOUS) ×3
ELECT CAUTERY BLADE 6.4 (BLADE) ×4 IMPLANT
ELECT REM PT RETURN 9FT ADLT (ELECTROSURGICAL) ×3
ELECTRODE BLDE 4.0 EZ CLN MEGD (MISCELLANEOUS) ×2 IMPLANT
ELECTRODE REM PT RTRN 9FT ADLT (ELECTROSURGICAL) ×2 IMPLANT
EVACUATOR SILICONE 100CC (DRAIN) IMPLANT
FILTER STRAW FLUID ASPIR (MISCELLANEOUS) ×4 IMPLANT
GAUZE 4X4 16PLY ~~LOC~~+RFID DBL (SPONGE) ×4 IMPLANT
GAUZE SPONGE 4X4 12PLY STRL (GAUZE/BANDAGES/DRESSINGS) ×4 IMPLANT
GAUZE SPONGE 4X4 12PLY STRL LF (GAUZE/BANDAGES/DRESSINGS) ×2 IMPLANT
GLOVE SRG 8 PF TXTR STRL LF DI (GLOVE) ×2 IMPLANT
GLOVE SURG ENC MOIS LTX SZ6.5 (GLOVE) ×4 IMPLANT
GLOVE SURG ENC MOIS LTX SZ8 (GLOVE) ×4 IMPLANT
GLOVE SURG UNDER POLY LF SZ7 (GLOVE) ×4 IMPLANT
GLOVE SURG UNDER POLY LF SZ8 (GLOVE) ×3
GOWN STRL REUS W/ TWL LRG LVL3 (GOWN DISPOSABLE) ×4 IMPLANT
GOWN STRL REUS W/ TWL XL LVL3 (GOWN DISPOSABLE) ×2 IMPLANT
GOWN STRL REUS W/TWL LRG LVL3 (GOWN DISPOSABLE) ×6
GOWN STRL REUS W/TWL XL LVL3 (GOWN DISPOSABLE) ×3
GRAFT BNE MATRIX VG FRMBL L 10 (Bone Implant) IMPLANT
IV CATH 14GX2 1/4 (CATHETERS) ×4 IMPLANT
KIT BASIN OR (CUSTOM PROCEDURE TRAY) ×4 IMPLANT
KIT POSITION SURG JACKSON T1 (MISCELLANEOUS) ×4 IMPLANT
KIT TURNOVER KIT B (KITS) ×4 IMPLANT
MARKER SKIN DUAL TIP RULER LAB (MISCELLANEOUS) ×8 IMPLANT
NDL 18GX1X1/2 (RX/OR ONLY) (NEEDLE) ×2 IMPLANT
NDL HYPO 25GX1X1/2 BEV (NEEDLE) ×2 IMPLANT
NDL SPNL 18GX3.5 QUINCKE PK (NEEDLE) ×4 IMPLANT
NEEDLE 18GX1X1/2 (RX/OR ONLY) (NEEDLE) ×3 IMPLANT
NEEDLE 22X1 1/2 (OR ONLY) (NEEDLE) ×8 IMPLANT
NEEDLE HYPO 25GX1X1/2 BEV (NEEDLE) ×3 IMPLANT
NEEDLE SPNL 18GX3.5 QUINCKE PK (NEEDLE) ×6 IMPLANT
NS IRRIG 1000ML POUR BTL (IV SOLUTION) ×4 IMPLANT
OIL CARTRIDGE MAESTRO DRILL (MISCELLANEOUS) ×3
PACK LAMINECTOMY ORTHO (CUSTOM PROCEDURE TRAY) ×4 IMPLANT
PACK UNIVERSAL I (CUSTOM PROCEDURE TRAY) ×4 IMPLANT
PAD ARMBOARD 7.5X6 YLW CONV (MISCELLANEOUS) ×8 IMPLANT
PATTIES SURGICAL .5 X1 (DISPOSABLE) ×4 IMPLANT
PATTIES SURGICAL .5X1.5 (GAUZE/BANDAGES/DRESSINGS) ×4 IMPLANT
ROD SPINAL EXP 5.5X60 (Rod) ×4 IMPLANT
SCREW SET SINGLE INNER (Screw) ×12 IMPLANT
SCREW VIPER CORT FIX 6.00X30 (Screw) ×8 IMPLANT
SCREW VIPER CORT FIX 6X35 (Screw) ×4 IMPLANT
SPONGE INTESTINAL PEANUT (DISPOSABLE) ×4 IMPLANT
SPONGE SURGIFOAM ABS GEL 100 (HEMOSTASIS) ×4 IMPLANT
STRIP CLOSURE SKIN 1/2X4 (GAUZE/BANDAGES/DRESSINGS) ×6 IMPLANT
SURGIFLO W/THROMBIN 8M KIT (HEMOSTASIS) IMPLANT
SUT MNCRL AB 4-0 PS2 18 (SUTURE) ×4 IMPLANT
SUT VIC AB 0 CT1 18XCR BRD 8 (SUTURE) ×2 IMPLANT
SUT VIC AB 0 CT1 8-18 (SUTURE) ×3
SUT VIC AB 1 CT1 18XCR BRD 8 (SUTURE) ×2 IMPLANT
SUT VIC AB 1 CT1 8-18 (SUTURE) ×3
SUT VIC AB 2-0 CT2 18 VCP726D (SUTURE) ×4 IMPLANT
SYR 20ML LL LF (SYRINGE) ×8 IMPLANT
SYR BULB IRRIG 60ML STRL (SYRINGE) ×4 IMPLANT
SYR CONTROL 10ML LL (SYRINGE) ×8 IMPLANT
SYR TB 1ML LUER SLIP (SYRINGE) ×4 IMPLANT
TAP EXPEDIUM DL 4.35 (INSTRUMENTS) ×2 IMPLANT
TAP EXPEDIUM DL 5.0 (INSTRUMENTS) ×2 IMPLANT
TAP EXPEDIUM DL 6.0 (INSTRUMENTS) ×2 IMPLANT
TRAY FOLEY MTR SLVR 16FR STAT (SET/KITS/TRAYS/PACK) ×4 IMPLANT
WATER STERILE IRR 1000ML POUR (IV SOLUTION) ×4 IMPLANT
YANKAUER SUCT BULB TIP NO VENT (SUCTIONS) ×4 IMPLANT

## 2021-11-27 NOTE — Anesthesia Procedure Notes (Signed)
Procedure Name: Intubation Date/Time: 11/27/2021 12:30 PM Performed by: Minerva Ends, CRNA Pre-anesthesia Checklist: Patient identified, Emergency Drugs available, Suction available and Patient being monitored Patient Re-evaluated:Patient Re-evaluated prior to induction Oxygen Delivery Method: Circle system utilized Preoxygenation: Pre-oxygenation with 100% oxygen Induction Type: IV induction Ventilation: Mask ventilation without difficulty Laryngoscope Size: Mac and 3 Grade View: Grade III Tube type: Oral Tube size: 7.0 mm Number of attempts: 1 Airway Equipment and Method: Stylet Placement Confirmation: ETT inserted through vocal cords under direct vision, positive ETCO2 and breath sounds checked- equal and bilateral Secured at: 23 cm Tube secured with: Tape Dental Injury: Teeth and Oropharynx as per pre-operative assessment

## 2021-11-27 NOTE — H&P (Signed)
° ° ° °PREOPERATIVE H&P ° °Chief Complaint: Bilateral leg pain x 8 years ° °HPI: °Lee Mclaughlin is a 70 y.o. male who presents with ongoing pain in the bilateral legs ° °MRI reveals severe stenosis at L3/4 and L4/5, as well as instability ° °Patient has failed multiple forms of conservative care and continues to have pain (see office notes for additional details regarding the patient's full course of treatment) ° °Past Medical History:  °Diagnosis Date  ° Coronary artery disease   ° COVID-19   ° In december 2020  ° Dyslipidemia   ° Dysrhythmia   ° a flutter  ° GERD (gastroesophageal reflux disease)   ° H/O cardiovascular stress test 10/19/2012  ° MET test - good effort, peak VO2 of almost 112% predicted, HR 79% predicted, mildly ischemia at end of exercise  ° History of hyperthyroidism as a child   ° History of nuclear stress test 03/2011  ° bruce myoview; mild perfusion defect in mid anteroseptal, apical septal, apical regions (infarct/scar/breast attenuation); post-stress EF 55%; no signficant ischemia  ° Ischemic cardiomyopathy   ° history of   ° Myocardial infarction ([facility])   ° Pneumonia   ° as a kid  ° PONV (postoperative nausea and vomiting)   ° Primary localized osteoarthritis of right hip 05/24/2019  ° Primary localized osteoarthritis of right knee   ° Restless leg   ° right total hip arthroplasty with dislocation of hip ([facility]) 08/14/2019  ° S/P CABG (coronary artery bypass graft) 2000  ° Staphylococcal arthritis of right knee ([facility]) 2004  ° Wears glasses   ° °Past Surgical History:  °Procedure Laterality Date  ° A-FLUTTER ABLATION N/A 10/01/2018  ° Procedure: A-FLUTTER ABLATION;  Surgeon: Taylor, Gregg W, MD;  [facility];  Service: Cardiovascular;  Laterality: N/A;  ° A-FLUTTER ABLATION N/A 11/02/2018  ° Procedure: A-FLUTTER ABLATION;  Surgeon: Taylor, Gregg W, MD;  [facility];  Service: Cardiovascular;  Laterality: N/A;  ° CARDIAC CATHETERIZATION  06/01/2007  ° no obstructive  CAD, patent grafts, EF 40%, apical hypokinesis, anterolateral hypokinesis (Dr. R. McQueen)   ° CERVICAL SPINE SURGERY    ° CHOLECYSTECTOMY    ° COLONOSCOPY    ° CORONARY ARTERY BYPASS GRAFT  06/05/1999  ° LIMA to LAD, SVG to diagonal 1, SVG to PDA of Cfx, right radial graft to OM1 (Dr. C. Owen)   ° ESOPHAGOGASTRODUODENOSCOPY    ° EYE SURGERY    ° KNEE SURGERY Right   ° pt. states x 6   ° TOTAL HIP ARTHROPLASTY Right 05/24/2019  ° Procedure: TOTAL HIP ARTHROPLASTY;  Surgeon: Landau, Joshua, MD;  [facility];  Service: Orthopedics;  Laterality: Right;  ° TOTAL HIP REVISION Right 03/15/2020  ° Procedure: TOTAL HIP REVISION, posterior approach;  Surgeon: Olin, Matthew, MD;  Location: [facility];  Service: Orthopedics;  Laterality: Right;  90 mins °posterior approach  ° TOTAL KNEE ARTHROPLASTY Right 07/23/2015  ° Procedure: RIGHT TOTAL KNEE ARTHROPLASTY;  Surgeon: Robert Wainer, MD;  Location: [facility];  Service: Orthopedics;  Laterality: Right;  ° TRANSTHORACIC ECHOCARDIOGRAM  02/2008  ° EF 40%, severe apical wall hypokinesis, mod-severe anterior wall hypokinesis; RV mildly dilated; mild mitral annular calcif; mild TR, RSVP 30-40mmHg  ° °Social History  ° °Socioeconomic History  ° Marital status: Married  °  Spouse name: Not on file  ° Number of children: 2  ° Years of education: 12  ° Highest education level: Not on file  °Occupational   History  °  Employer: [employer]  °Tobacco Use  ° Smoking status: Some Days  °  Types: Cigars  ° Smokeless tobacco: Never  ° Tobacco comments:  °  cigar occassionally  °Vaping Use  ° Vaping Use: Never used  °Substance and Sexual Activity  ° Alcohol use: Yes  °  Alcohol/week: 0.0 standard drinks  °  Comment: minimal  ° Drug use: No  ° Sexual activity: Not Currently  °Other Topics Concern  ° Not on file  °Social History Narrative  ° Not on file  ° °Social Determinants of Health  ° °Financial Resource Strain: Not on file  °Food Insecurity: Not on file  °Transportation Needs: Not on file   °Physical Activity: Not on file  °Stress: Not on file  °Social Connections: Not on file  ° °Family History  °Problem Relation Age of Onset  ° CAD Father   °     s/p CABGx4 at 74  ° Hyperlipidemia Father   ° °Allergies  °Allergen Reactions  ° Morphine And Related Nausea And Vomiting  ° Crestor [Rosuvastatin] Other (See Comments)  °  Myalgias °  ° Vytorin [Ezetimibe-Simvastatin] Other (See Comments)  °  Myalgias °  ° °Prior to Admission medications   °Medication Sig Start Date End Date Taking? Authorizing Provider  °albuterol (VENTOLIN HFA) 108 (90 Base) MCG/ACT inhaler Inhale 2 puffs into the lungs every 6 (six) hours as needed for wheezing or shortness of breath.   Yes [provider]  °carvedilol (COREG) 12.5 MG tablet Take 12.5 mg by mouth 2 (two) times daily with a meal.   Yes [provider]  °Evolocumab (REPATHA SURECLICK) 140 MG/ML SOAJ Inject 140 mg into the skin every 14 (fourteen) days. 04/24/21  Yes Hilty, Kenneth C, MD  °gabapentin (NEURONTIN) 600 MG tablet Take 1 tablet (600 mg total) by mouth at bedtime. °Patient taking differently: Take 300-600 mg by mouth See admin instructions. 300 mg daily as needed, 600 mg at bedtime 10/09/21  Yes Olalere, Adewale A, MD  °hydrOXYzine (ATARAX) 25 MG tablet Take 25 mg by mouth at bedtime as needed (sleep).   Yes [provider]  °loratadine (CLARITIN) 10 MG tablet Take 10 mg by mouth daily.   Yes [provider]  °olmesartan (BENICAR) 40 MG tablet Take 40 mg by mouth every morning.    Yes [provider]  °tiZANidine (ZANAFLEX) 4 MG tablet Take 4 mg by mouth every evening. 10/03/21  Yes [provider]  ° ° ° °All other systems have been reviewed and were otherwise negative with the exception of those mentioned in the HPI and as above. ° °Physical Exam: °There were no vitals filed for this visit. ° °There is no height or weight on file to calculate BMI. ° °General: Alert, no acute distress °Cardiovascular: No pedal  edema °Respiratory: No cyanosis, no use of accessory musculature °Skin: No lesions in the area of chief complaint °Neurologic: Sensation intact distally °Psychiatric: Patient is competent for consent with normal mood and affect °Lymphatic: No axillary or cervical lymphadenopathy ° ° °Assessment/Plan: °BILATERAL LUMBAR RADICULOPATHY  °Plan for Procedure(s): °LEFT-SIDED LUMBAR 3- LUMBAR 4, LUMBAR 4 - LUMBAR 5 TRANSFORAMINAL LUMBAR INTERBODY FUSION WITH INSTRUMENTATION AND ALLOGRAFT ° ° °Javan L Dumonski, MD °11/27/2021 °7:18 AM  °

## 2021-11-27 NOTE — Op Note (Signed)
PATIENT NAME: Lee Mclaughlin   MEDICAL RECORD NO.:   [id number]    DATE OF BIRTH: Oct 05, 1951    DATE OF PROCEDURE: 11/27/2021                                 OPERATIVE REPORT     PREOPERATIVE DIAGNOSES: 1. Severe spinal stenosis L3-4, L4-5 (M48.062) 2. Bilateral lumbar radiculopathy (M54.16) 3. L3-4, L4-5 spondylolisthesis (M53.2X6)   POSTOPERATIVE DIAGNOSES: 1. Severe spinal stenosis L3-4, L4-5 (M48.062) 2. Bilateral lumbar radiculopathy (M54.16) 3. L3-4, L4-5 spondylolisthesis (M53.2X6)   PROCEDURES: 1. Lumbar decompression, L3-4, L4-5, including bilateral partial facetectomy, and bilateral lumbar decompression 2. Left-sided L3-4, L4-5 transforaminal lumbar interbody fusion. 3. Right-sided L3-4, L4-5 posterolateral fusion. 4. Insertion of interbody device x 2 (Globus expandable intervertebral spacers). 5. Placement of segmental posterior instrumentation L3, L4, L5, bilaterally. 6. Use of local autograft. 7. Use of morselized allograft - ViviGen. 8. Intraoperative use of fluoroscopy.   SURGEON:  Phylliss Bob, MD.   ASSISTANTPricilla Holm, PA-C.   ANESTHESIA:  General endotracheal anesthesia.   COMPLICATIONS:  None.   DISPOSITION:  Stable.   ESTIMATED BLOOD LOSS:  200cc   INDICATIONS FOR SURGERY:  Briefly, Lee Mclaughlin is a pleasant 70 y.o. -year-old male who did present to me with severe and ongoing pain in the bilateral legs. I did feel that the symptoms were secondary to the findings noted above.  The patient failed conservative care and did wish to proceed with the procedure noted above.    OPERATIVE DETAILS:  On 11/27/2021, the patient was brought to surgery and general endotracheal anesthesia was administered.  The patient was placed prone on a well-padded flat Jackson bed with a spinal frame.  Antibiotics were given and a time-out procedure was performed. The back was prepped and draped in the usual fashion.  A midline incision was made overlying the L3-4 and  L4-5 intervertebral spaces.  The fascia was incised at the midline.  The paraspinal musculature was bluntly swept laterally.  Anatomic landmarks for the pedicles were exposed. Using fluoroscopy, I did cannulate the L3, L4, and L5 pedicles bilaterally, using a medial to lateral cortical trajectory technique.  On the right side, the posterolateral gutter and facet joints at L3-4 and L4-5 were decorticated and 6 mm screws of the appropriate length were placed at L3, L4, and L5 pedicles and a 60-mm rod was placed and distraction was applied across the rod at each intervertebral level.  On the left side, the cannulated pedicle holes were filled with bone wax.  I then proceeded with the decompressive aspect of the procedure.     Starting at L4-5, I did perform a laminotomy and a full facetectomy on the left.  I was able to thoroughly and entirely decompress the L4-5 intervertebral space bilaterally, removing facet hypertrophy and ligamentum flavum hypertrophy.  At this point, with an assistant holding medial retraction of the traversing left L5 nerve, I did perform a thorough and complete L4-5 intervertebral discectomy.  The intervertebral space was then liberally packed with autograft from the decompression, as well as allograft in the form of ViviGen, as was the appropriately sized intervertebral spacer.  The spacer was expanded to approximately 16mm in height.  Distraction was then released on the contralateral right side.  I then turned my attention to the L3-4 level.  Once again, it was clearly evident that there was stenosis at the L3-4 level.  The stenosis was thoroughly and adequately decompressed by performing a bilateral partial facetectomy.  I was able to thoroughly decompress both the left L3 and left L4 nerves. With an assistant holding medial retraction of the traversing left L4 nerve, I did perform an annulotomy at the posterolateral aspect of the L3-4 intervertebral space.  I then used  a series of curettes and pituitary rongeurs to perform a thorough and complete intervertebral diskectomy.  The intervertebral space was then liberally packed with autograft as well as allograft in the form of ViviGen, as was the appropriate-sized intervertebral spacer.  The spacer was then tamped into position in the usual fashion, and expanded to approximately 64mm in height. I was very pleased with the press-fit of the spacer.  I then placed 6 mm screws on the left at L3, L4, and L5.  A 60-mm rod was then placed and caps were placed. The distraction was then released on the contralateral right side.  All 6 caps were then locked.  The wound was copiously irrigated with a total of approximately 3 L prior to placing the bone graft.  Additional autograft and allograft were then packed into the posterolateral gutter on the right side to help aid in the success of the fusion.  The wound was  explored for any undue bleeding and there was no substantial bleeding encountered.  Gel-Foam was placed over the laminectomy site.  The wound was then closed in layers using #1 Vicryl followed by 2-0 Vicryl, followed by 4-0 Monocryl.  Benzoin and Steri-Strips were applied followed by sterile dressing.      Of note, Lee Mclaughlin was my assistant throughout surgery, and did aid in retraction, suctioning, placement of the hardware, and closure.       Lee Bamberg, MD

## 2021-11-27 NOTE — Transfer of Care (Signed)
Immediate Anesthesia Transfer of Care Note  Patient: Donalda Ewings  Procedure(s) Performed: LEFT-SIDED LUMBAR 3- LUMBAR 4, LUMBAR 4 - LUMBAR 5 TRANSFORAMINAL LUMBAR INTERBODY FUSION WITH INSTRUMENTATION AND ALLOGRAFT (Left)  Patient Location: PACU  Anesthesia Type:General  Level of Consciousness: awake, alert  and patient cooperative  Airway & Oxygen Therapy: Patient Spontanous Breathing and Patient connected to face mask oxygen  Post-op Assessment: Report given to RN, Post -op Vital signs reviewed and stable and Patient moving all extremities X 4  Post vital signs: Reviewed and stable  Last Vitals:  Vitals Value Taken Time  BP 143/100 11/27/21 1727  Temp    Pulse 85 11/27/21 1732  Resp 16 11/27/21 1732  SpO2 97 % 11/27/21 1732  Vitals shown include unvalidated device data.  Last Pain:  Vitals:   11/27/21 1157  TempSrc:   PainSc: 0-No pain         Complications: No notable events documented.

## 2021-11-28 MED ORDER — METHOCARBAMOL 500 MG PO TABS
500.0000 mg | ORAL_TABLET | Freq: Four times a day (QID) | ORAL | 2 refills | Status: DC | PRN
Start: 2021-11-28 — End: 2023-04-21

## 2021-11-28 MED ORDER — OXYCODONE-ACETAMINOPHEN 5-325 MG PO TABS: 1.0000 | ORAL_TABLET | ORAL | 0 refills | Status: DC | PRN

## 2021-11-28 MED FILL — Thrombin For Soln Kit 20000 Unit: CUTANEOUS | Qty: 1 | Status: AC

## 2021-11-28 NOTE — Anesthesia Postprocedure Evaluation (Signed)
Anesthesia Post Note  Patient: Lee Mclaughlin  Procedure(s) Performed: LEFT-SIDED LUMBAR 3- LUMBAR 4, LUMBAR 4 - LUMBAR 5 TRANSFORAMINAL LUMBAR INTERBODY FUSION WITH INSTRUMENTATION AND ALLOGRAFT (Left)     Patient location during evaluation: Other Anesthesia Type: General Level of consciousness: awake and alert Pain management: pain level controlled Vital Signs Assessment: post-procedure vital signs reviewed and stable Respiratory status: spontaneous breathing, nonlabored ventilation and respiratory function stable Cardiovascular status: blood pressure returned to baseline and stable Postop Assessment: no apparent nausea or vomiting Anesthetic complications: no   No notable events documented.  Last Vitals:  Vitals:   11/28/21 0342 11/28/21 0733  BP: 107/63 115/68  Pulse: 68 81  Resp: 20 18  Temp: 36.7 C 36.7 C  SpO2: 97% 97%    Last Pain:  Vitals:   11/28/21 1018  TempSrc:   PainSc: 3                  Lee Mclaughlin,W. EDMOND

## 2021-11-28 NOTE — Evaluation (Signed)
Physical Therapy Evaluation and Discharge Patient Details Name: Lee Mclaughlin MRN: [id number] DOB: Jul 24, 1951 Today's Date: 11/28/2021  History of Present Illness  Pt is a 70 y/o male who presents s/p L3-L5 TLIF on 11/27/2021. PMH significant for CAD, COVID-19, MI, cervical spine surgery, CABG, x6 R knee surgeries including TKR, R THR and revision.   Clinical Impression  Patient evaluated by Physical Therapy with no further acute PT needs identified. All education has been completed and the patient has no further questions. Pt was able to demonstrate transfers and ambulation with gross modified independence and no AD. Pt was educated on precautions, brace application/wearing schedule, appropriate activity progression, and car transfer. See below for any follow-up Physical Therapy or equipment needs. PT is signing off. Thank you for this referral.        Recommendations for follow up therapy are one component of a multi-disciplinary discharge planning process, led by the attending physician.  Recommendations may be updated based on patient status, additional functional criteria and insurance authorization.  Follow Up Recommendations No PT follow up    Assistance Recommended at Discharge PRN  Functional Status Assessment Patient has had a recent decline in their functional status and demonstrates the ability to make significant improvements in function in a reasonable and predictable amount of time.  Equipment Recommendations  None recommended by PT    Recommendations for Other Services       Precautions / Restrictions Precautions Precautions: Fall;Back Precaution Booklet Issued: Yes (comment) Precaution Comments: Reviewed handout and pt was cued for precautions during functional mobility. Required Braces or Orthoses: Spinal Brace Spinal Brace: Thoracolumbosacral orthotic;Applied in sitting position Restrictions Weight Bearing Restrictions: No      Mobility  Bed Mobility                General bed mobility comments: Pt was received sitting up in recliner    Transfers Overall transfer level: Modified independent Equipment used: None               General transfer comment: Good maintenance of posture with sit<>stand.    Ambulation/Gait Ambulation/Gait assistance: Modified independent (Device/Increase time) Gait Distance (Feet): 400 Feet Assistive device: None Gait Pattern/deviations: Step-through pattern;Decreased stride length;Trunk flexed Gait velocity: Decreased Gait velocity interpretation: 1.31 - 2.62 ft/sec, indicative of limited community ambulator   General Gait Details: VC's for maintenance of precautions throughout ambulation but overall good posture and no overt LOB.  Stairs            Wheelchair Mobility    Modified Rankin (Stroke Patients Only)       Balance Overall balance assessment: No apparent balance deficits (not formally assessed)                                           Pertinent Vitals/Pain Pain Assessment: 0-10 Pain Score: 5  Pain Location: Incision site Pain Descriptors / Indicators: Operative site guarding Pain Intervention(s): Limited activity within patient's tolerance;Monitored during session;Repositioned    Home Living Family/patient expects to be discharged to:: Private residence Living Arrangements: Spouse/significant other Available Help at Discharge: Family;Available 24 hours/day Type of Home: House Home Access: Level entry       Home Layout: Two level;Able to live on main level with bedroom/bathroom Home Equipment: Crutches;Shower seat - built Charity fundraiser (2 wheels);Hand held shower head      Prior Function Prior Level of Function :  Independent/Modified Independent (Limited activity tolerance due to pain)                     Hand Dominance        Extremity/Trunk Assessment   Upper Extremity Assessment Upper Extremity Assessment: Defer to OT  evaluation    Lower Extremity Assessment Lower Extremity Assessment: Generalized weakness (Consistent with pre-op diagnosis)    Cervical / Trunk Assessment Cervical / Trunk Assessment: Back Surgery  Communication   Communication: No difficulties  Cognition Arousal/Alertness: Awake/alert Behavior During Therapy: WFL for tasks assessed/performed Overall Cognitive Status: Impaired/Different from baseline Area of Impairment: Attention;Safety/judgement                   Current Attention Level: Selective     Safety/Judgement: Decreased awareness of safety     General Comments: Frequent cues to attend to task as pt talkative and easily distracted.        General Comments      Exercises     Assessment/Plan    PT Assessment Patient does not need any further PT services  PT Problem List         PT Treatment Interventions      PT Goals (Current goals can be found in the Care Plan section)  Acute Rehab PT Goals Patient Stated Goal: Home today PT Goal Formulation: All assessment and education complete, DC therapy    Frequency     Barriers to discharge        Co-evaluation               AM-PAC PT "6 Clicks" Mobility  Outcome Measure Help needed turning from your back to your side while in a flat bed without using bedrails?: None Help needed moving from lying on your back to sitting on the side of a flat bed without using bedrails?: None Help needed moving to and from a bed to a chair (including a wheelchair)?: None Help needed standing up from a chair using your arms (e.g., wheelchair or bedside chair)?: None Help needed to walk in hospital room?: None Help needed climbing 3-5 steps with a railing? : A Little 6 Click Score: 23    End of Session Equipment Utilized During Treatment: Gait belt;Back brace Activity Tolerance: Patient tolerated treatment well Patient left: in chair;with call bell/phone within reach Nurse Communication: Mobility  status PT Visit Diagnosis: Unsteadiness on feet (R26.81);Pain Pain - part of body:  (back)    Time: 2707-8675 PT Time Calculation (min) (ACUTE ONLY): 19 min   Charges:   PT Evaluation $PT Eval Low Complexity: 1 Low          Conni Slipper, PT, DPT Acute Rehabilitation Services Pager: [phone] Office: [phone]   Marylynn Pearson 11/28/2021, 9:59 AM

## 2021-11-28 NOTE — Progress Notes (Signed)
° ° °  Patient doing well  Patient denies leg pain Has been ambulating   Physical Exam: Vitals:   11/27/21 2313 11/28/21 0342  BP: 121/61 107/63  Pulse: 81 68  Resp: 20 20  Temp: 97.8 F (36.6 C) 98 F (36.7 C)  SpO2: 98% 97%    Dressing in place NVI  POD #1 s/p L3-5 decompression and fusion, doing well  - up with PT/OT, encourage ambulation - Percocet for pain, Robaxin for muscle spasms - d/c home today with f/u in 2 weeks

## 2021-11-28 NOTE — Progress Notes (Signed)
Patient alert and oriented, mae's well, voiding adequate amount of urine, swallowing without difficulty, no c/o pain at time of discharge. Patient discharged home with family. Script and discharged instructions given to patient. Patient and family stated understanding of instructions given. Patient has an appointment with Dr Yevette Edwards. Patient awaiting ride

## 2021-11-28 NOTE — Evaluation (Signed)
Occupational Therapy Evaluation Patient Details Name: Lee Mclaughlin MRN: [id number] DOB: 06/26/1951 Today's Date: 11/28/2021   History of Present Illness Pt is a 70 y/o male who presents s/p L3-L5 TLIF on 11/27/2021. PMH significant for CAD, COVID-19, MI, cervical spine surgery, CABG, x6 R knee surgeries including TKR, R THR and revision.   Clinical Impression   Pt admitted for concerns listed above. PTA Pt reported that he was independent with all ADL's and IADL's, at times limited due to pain from back and legs. At this time, pt presents with good motivation, requiring increased cuing for safety, fair balance and strength. Reviewed compensatory strategies and able to recall 3/3 spinal precautions. He has no further OT needs at this time and acute OT will sign off.       Recommendations for follow up therapy are one component of a multi-disciplinary discharge planning process, led by the attending physician.  Recommendations may be updated based on patient status, additional functional criteria and insurance authorization.   Follow Up Recommendations  No OT follow up    Assistance Recommended at Discharge Set up Supervision/Assistance  Functional Status Assessment  Patient has had a recent decline in their functional status and demonstrates the ability to make significant improvements in function in a reasonable and predictable amount of time.  Equipment Recommendations  None recommended by OT    Recommendations for Other Services       Precautions / Restrictions Precautions Precautions: Fall;Back Precaution Booklet Issued: Yes (comment) Precaution Comments: Reviewed handout and pt was cued for precautions during functional mobility. Required Braces or Orthoses: Spinal Brace Spinal Brace: Thoracolumbosacral orthotic;Applied in sitting position Restrictions Weight Bearing Restrictions: No      Mobility Bed Mobility               General bed mobility comments: Pt was  received sitting up in recliner    Transfers Overall transfer level: Modified independent Equipment used: None               General transfer comment: Good maintenance of posture with sit<>stand.      Balance Overall balance assessment: No apparent balance deficits (not formally assessed)                                         ADL either performed or assessed with clinical judgement   ADL Overall ADL's : Modified independent                                       General ADL Comments: Pt able to dress, toilet, and groom during session with no difficulties. Reviewed compensatory strategies for all ADL's.     Vision Baseline Vision/History: 0 No visual deficits Ability to See in Adequate Light: 0 Adequate Patient Visual Report: No change from baseline Vision Assessment?: No apparent visual deficits     Perception     Praxis      Pertinent Vitals/Pain Pain Assessment: 0-10 Pain Score: 5  Pain Location: Incision site Pain Descriptors / Indicators: Operative site guarding Pain Intervention(s): Monitored during session;Repositioned     Hand Dominance Right   Extremity/Trunk Assessment Upper Extremity Assessment Upper Extremity Assessment: Overall WFL for tasks assessed   Lower Extremity Assessment Lower Extremity Assessment: Defer to PT evaluation   Cervical / Trunk Assessment Cervical /  Trunk Assessment: Back Surgery   Communication Communication Communication: No difficulties   Cognition Arousal/Alertness: Awake/alert Behavior During Therapy: WFL for tasks assessed/performed Overall Cognitive Status: Impaired/Different from baseline Area of Impairment: Attention;Safety/judgement                   Current Attention Level: Selective     Safety/Judgement: Decreased awareness of safety     General Comments: Frequent cues to attend to task as pt talkative and easily distracted.     General Comments  VSS on  RA    Exercises     Shoulder Instructions      Home Living Family/patient expects to be discharged to:: Private residence Living Arrangements: Spouse/significant other Available Help at Discharge: Family;Available 24 hours/day Type of Home: House Home Access: Level entry     Home Layout: Two level;Able to live on main level with bedroom/bathroom     Bathroom Shower/Tub: Producer, television/film/video: Handicapped height Bathroom Accessibility: Yes How Accessible: Accessible via walker Home Equipment: Crutches;Shower seat - built Charity fundraiser (2 wheels);Hand held shower head          Prior Functioning/Environment Prior Level of Function : Independent/Modified Independent (Limited activity tolerance due to pain)                        OT Problem List: Decreased range of motion;Decreased activity tolerance;Impaired balance (sitting and/or standing);Decreased safety awareness;Pain      OT Treatment/Interventions:      OT Goals(Current goals can be found in the care plan section) Acute Rehab OT Goals Patient Stated Goal: To go home OT Goal Formulation: All assessment and education complete, DC therapy Time For Goal Achievement: 11/28/21 Potential to Achieve Goals: Good  OT Frequency:     Barriers to D/C:            Co-evaluation              AM-PAC OT "6 Clicks" Daily Activity     Outcome Measure Help from another person eating meals?: None Help from another person taking care of personal grooming?: None Help from another person toileting, which includes using toliet, bedpan, or urinal?: None Help from another person bathing (including washing, rinsing, drying)?: None Help from another person to put on and taking off regular upper body clothing?: None Help from another person to put on and taking off regular lower body clothing?: None 6 Click Score: 24   End of Session Equipment Utilized During Treatment: Back brace Nurse Communication:  Mobility status  Activity Tolerance: Patient tolerated treatment well Patient left: in chair;with call bell/phone within reach  OT Visit Diagnosis: Unsteadiness on feet (R26.81);Other abnormalities of gait and mobility (R26.89);Muscle weakness (generalized) (M62.81)                Time: 2703-5009 OT Time Calculation (min): 27 min Charges:  OT General Charges $OT Visit: 1 Visit OT Evaluation $OT Eval Low Complexity: 1 Low OT Treatments $Self Care/Home Management : 8-22 mins  Breelyn Icard H., OTR/L Acute Rehabilitation  Dwaine Pringle Elane Zilpha Mcandrew 11/28/2021, 10:52 AM

## 2021-12-02 ENCOUNTER — Encounter (HOSPITAL_COMMUNITY): Payer: Self-pay | Admitting: Orthopedic Surgery

## 2021-12-16 NOTE — Discharge Summary (Signed)
Patient ID: Lee Mclaughlin MRN: [id number] DOB/AGE: 1951/11/21 71 y.o.  Admit date: 11/27/2021 Discharge date: 11/28/2021  Admission Diagnoses: Radiculopathy  Discharge Diagnoses:  Same  Past Medical History:  Diagnosis Date   Coronary artery disease    COVID-19    In december 2020   Dyslipidemia    Dysrhythmia    a flutter   GERD (gastroesophageal reflux disease)    H/O cardiovascular stress test 10/19/2012   MET test - good effort, peak VO2 of almost 112% predicted, HR 79% predicted, mildly ischemia at end of exercise   History of hyperthyroidism as a child    History of nuclear stress test 03/2011   bruce myoview; mild perfusion defect in mid anteroseptal, apical septal, apical regions (infarct/scar/breast attenuation); post-stress EF 55%; no signficant ischemia   Ischemic cardiomyopathy    history of    Myocardial infarction ([place])    Pneumonia    as a kid   PONV (postoperative nausea and vomiting)    Primary localized osteoarthritis of right hip 05/24/2019   Primary localized osteoarthritis of right knee    Restless leg    right total hip arthroplasty with dislocation of hip ([place]) 08/14/2019   S/P CABG (coronary artery bypass graft) 2000   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 male who was admitted 11/27/2021 for operative treatment of Radiculopathy. Patient has severe unremitting pain that affects sleep, daily activities, and work/hobbies. After pre-op clearance the patient was taken to the operating room on 11/27/2021 and underwent  Procedure(s): LEFT-SIDED LUMBAR 3- LUMBAR 4, LUMBAR 4 - LUMBAR 5 TRANSFORAMINAL LUMBAR INTERBODY FUSION WITH INSTRUMENTATION AND ALLOGRAFT.     Patient was given perioperative antibiotics:  Anti-infectives (From admission, onward)    Start     Dose/Rate Route Frequency Ordered Stop   11/27/21 2035  ceFAZolin (ANCEF) IVPB 2g/100 mL premix        2 g 200 mL/hr over 30 Minutes Intravenous Every 8 hours 11/27/21 1845 11/28/21 0504   11/27/21 1145  ceFAZolin (ANCEF) IVPB 2g/100 mL premix        2 g 200 mL/hr over 30 Minutes Intravenous On call to O.R. 11/27/21 1139 11/27/21 1641        Patient was given sequential compression devices, early ambulation to prevent DVT.  Patient benefited maximally from hospital stay and there were no complications.    Recent vital signs: BP 115/68 (BP Location: Right Arm)    Pulse 81    Temp 98 F (36.7 C)    Resp 18    Ht '5\' 8"'  (1.727 m)    Wt 106.6 kg    SpO2 97%    BMI 35.73 kg/m    Discharge Medications:   Allergies as of 11/28/2021       Reactions   Morphine And Related Nausea And Vomiting   Crestor [rosuvastatin] Other (See Comments)   Myalgias   Vytorin [ezetimibe-simvastatin] Other (See Comments)   Myalgias        Medication List     TAKE these medications    albuterol 108 (90 Base) MCG/ACT inhaler Commonly known as: VENTOLIN HFA Inhale 2 puffs into the lungs every 6 (six) hours as needed for wheezing or shortness  of breath.   carvedilol 12.5 MG tablet Commonly known as: COREG Take 12.5 mg by mouth 2 (two) times daily with a meal.   gabapentin 600 MG tablet Commonly known as: Neurontin Take 1 tablet (600 mg total) by mouth at bedtime. What changed:  how much to take when to take this additional instructions   hydrOXYzine 25 MG tablet Commonly known as: ATARAX Take 25 mg by mouth at bedtime as needed (sleep).   loratadine 10 MG tablet Commonly known as: CLARITIN Take 10 mg by mouth daily.   methocarbamol 500 MG tablet Commonly known as: ROBAXIN Take 1 tablet (500 mg total) by mouth every 6 (six) hours as needed for muscle spasms.   olmesartan 40 MG  tablet Commonly known as: BENICAR Take 40 mg by mouth every morning.   oxyCODONE-acetaminophen 5-325 MG tablet Commonly known as: PERCOCET/ROXICET Take 1-2 tablets by mouth every 4 (four) hours as needed for severe pain.   Repatha SureClick 132 MG/ML Soaj Generic drug: Evolocumab Inject 140 mg into the skin every 14 (fourteen) days.   tiZANidine 4 MG tablet Commonly known as: ZANAFLEX Take 4 mg by mouth every evening.        Diagnostic Studies: DG Lumbar Spine 2-3 Views  Result Date: 11/27/2021 CLINICAL DATA:  Lumbar fusion EXAM: LUMBAR SPINE - 2-3 VIEW COMPARISON:  Film from earlier in the same day. FLUOROSCOPY TIME:  Radiation Exposure Index (as provided by the fluoroscopic device): 33.99 mGy If the device does not provide the exposure index: Fluoroscopy Time:  1 minute 10 seconds Number of Acquired Images:  2 FINDINGS: Interbody fusion is noted at L3-4 and L4-5 with pedicle screws at all 3 levels and posterior fixation. IMPRESSION: Lumbar fusion from L3-L5. Electronically Signed   By: Inez Catalina M.D.   On: 11/27/2021 17:24   DG Lumbar Spine 1 View  Result Date: 11/27/2021 CLINICAL DATA:  Localization film for L3-L5 surgery. EXAM: LUMBAR SPINE - 1 VIEW COMPARISON:  MRI lumbar spine 10/01/2015. FINDINGS: Instrumentation is seen at the level of the L3 spinous process and at the L4-L5 interspinous space. Spinal alignment is within normal limits. No acute fracture. There is moderate disc space narrowing and endplate osteophyte formation at L4-L5 and L5-S1 compatible with degenerative change. IMPRESSION: 1. Localization as above. Electronically Signed   By: Ronney Asters M.D.   On: 11/27/2021 16:23   DG C-Arm 1-60 Min-No Report  Result Date: 11/27/2021 Fluoroscopy was utilized by the requesting physician.  No radiographic interpretation.   DG C-Arm 1-60 Min-No Report  Result Date: 11/27/2021 Fluoroscopy was utilized by the requesting physician.  No radiographic interpretation.    DG C-Arm 1-60 Min-No Report  Result Date: 11/27/2021 Fluoroscopy was utilized by the requesting physician.  No radiographic interpretation.   DG C-Arm 1-60 Min-No Report  Result Date: 11/27/2021 Fluoroscopy was utilized by the requesting physician.  No radiographic interpretation.   DG C-Arm 1-60 Min-No Report  Result Date: 11/27/2021 Fluoroscopy was utilized by the requesting physician.  No radiographic interpretation.    Disposition: Discharge disposition: 01-Home or Self Care        POD #1 s/p L3-5 decompression and fusion, doing well   - up with PT/OT, encourage ambulation - Percocet for pain, Robaxin for muscle spasms -Scripts for pain sent to pharmacy electronically  -D/C instructions sheet printed and in chart -D/C today  -F/U in office 2 weeks   Signed: Justice Britain 12/16/2021, 7:55 PM

## 2022-05-21 ENCOUNTER — Encounter: Payer: Self-pay | Admitting: *Deleted

## 2022-05-21 DIAGNOSIS — Z006 Encounter for examination for normal comparison and control in clinical research program: Secondary | ICD-10-CM

## 2022-05-21 NOTE — Research (Signed)
Message left for Lee Mclaughlin .about essence research. Encouraged him to call back.

## 2022-05-30 ENCOUNTER — Other Ambulatory Visit: Payer: Self-pay | Admitting: Internal Medicine

## 2022-07-02 ENCOUNTER — Encounter: Payer: Self-pay | Admitting: *Deleted

## 2022-07-02 DIAGNOSIS — Z006 Encounter for examination for normal comparison and control in clinical research program: Secondary | ICD-10-CM

## 2022-07-02 NOTE — Research (Signed)
Message left for Lee Mclaughlin to call back if he is interested in essence research study.

## 2022-07-10 ENCOUNTER — Other Ambulatory Visit: Payer: Self-pay | Admitting: Pulmonary Disease

## 2022-10-24 ENCOUNTER — Telehealth: Payer: Self-pay | Admitting: *Deleted

## 2022-10-24 ENCOUNTER — Telehealth: Payer: Self-pay

## 2022-10-24 NOTE — Telephone Encounter (Signed)
   Name: Lee Mclaughlin  DOB: 05-Sep-1951  MRN: [id number]  Primary Cardiologist: Chrystie Nose, MD   Preoperative team, please contact this patient and set up a phone call appointment for further preoperative risk assessment. Please obtain consent and complete medication review. Thank you for your help.  I confirm that guidance regarding antiplatelet and oral anticoagulation therapy has been completed and, if necessary, noted below (none requested).    Joylene Grapes, NP 10/24/2022, 4:51 PM [facility]

## 2022-10-24 NOTE — Telephone Encounter (Signed)
Pt agreeable to plan of care for tele pre op 10/28/22 @ 3:20, due to procedure and the Thanksgiving Holiday the office the will be closed. Pt is grateful for the help. Med rec and consent are done.     Patient Consent for Virtual Visit        Lee Mclaughlin has provided verbal consent on 10/24/2022 for a virtual visit (video or telephone).   CONSENT FOR VIRTUAL VISIT FOR:  Lee Mclaughlin  By participating in this virtual visit I agree to the following:  I hereby voluntarily request, consent and authorize [facility] and its employed or contracted physicians, physician assistants, nurse practitioners or other licensed health care professionals (the Practitioner), to provide me with telemedicine health care services (the "Services") as deemed necessary by the treating Practitioner. I acknowledge and consent to receive the Services by the Practitioner via telemedicine. I understand that the telemedicine visit will involve communicating with the Practitioner through live audiovisual communication technology and the disclosure of certain medical information by electronic transmission. I acknowledge that I have been given the opportunity to request an in-person assessment or other available alternative prior to the telemedicine visit and am voluntarily participating in the telemedicine visit.  I understand that I have the right to withhold or withdraw my consent to the use of telemedicine in the course of my care at any time, without affecting my right to future care or treatment, and that the Practitioner or I may terminate the telemedicine visit at any time. I understand that I have the right to inspect all information obtained and/or recorded in the course of the telemedicine visit and may receive copies of available information for a reasonable fee.  I understand that some of the potential risks of receiving the Services via telemedicine include:  Delay or interruption in medical evaluation  due to technological equipment failure or disruption; Information transmitted may not be sufficient (e.g. poor resolution of images) to allow for appropriate medical decision making by the Practitioner; and/or  In rare instances, security protocols could fail, causing a breach of personal health information.  Furthermore, I acknowledge that it is my responsibility to provide information about my medical history, conditions and care that is complete and accurate to the best of my ability. I acknowledge that Practitioner's advice, recommendations, and/or decision may be based on factors not within their control, such as incomplete or inaccurate data provided by me or distortions of diagnostic images or specimens that may result from electronic transmissions. I understand that the practice of medicine is not an exact science and that Practitioner makes no warranties or guarantees regarding treatment outcomes. I acknowledge that a copy of this consent can be made available to me via my patient portal [facility] MyChart), or I can request a printed copy by calling the office of [facility].    I understand that my insurance will be billed for this visit.   I have read or had this consent read to me. I understand the contents of this consent, which adequately explains the benefits and risks of the Services being provided via telemedicine.  I have been provided ample opportunity to ask questions regarding this consent and the Services and have had my questions answered to my satisfaction. I give my informed consent for the services to be provided through the use of telemedicine in my medical care

## 2022-10-24 NOTE — Telephone Encounter (Signed)
Pt agreeable to plan of care for tele pre op 10/28/22 @ 3:20, due to procedure and the Thanksgiving Holiday the office the will be closed. Pt is grateful for the help. Med rec and consent are done.

## 2022-10-24 NOTE — Telephone Encounter (Signed)
   Pre-operative Risk Assessment    Patient Name: Lee Mclaughlin  DOB: July 01, 1951 MRN: [id number]      Request for Surgical Clearance    Procedure:   COLONOSCOPY  Date of Surgery:  Clearance 11/04/22                                 Surgeon:  DR Elnoria Howard Surgeon's Group or Practice Name:  [facility] Phone number:  [phone] Fax number:  [phone]   Type of Clearance Requested:   - Medical PLEASE EVALUATE THE PATIENT'S HISTORY NAD ADVISE [place] OF ANY SPECIAL CONSIDERATION THAT SHOULD BE MADE   Type of Anesthesia:   PROPOFOL   Additional requests/questions:  Please fax a copy of CLEARANCE to the surgeon's office.  Signed, Tera Helper Thomas Rhude  CCMA 10/24/2022, 4:23 PM

## 2022-10-28 ENCOUNTER — Ambulatory Visit: Payer: PPO | Attending: Internal Medicine | Admitting: General Practice

## 2022-10-28 DIAGNOSIS — Z01810 Encounter for preprocedural cardiovascular examination: Secondary | ICD-10-CM | POA: Diagnosis not present

## 2022-10-28 NOTE — Progress Notes (Signed)
Virtual Visit via Telephone Note   Because of Lee Mclaughlin's co-morbid illnesses, he is at least at moderate risk for complications without adequate follow up.  This format is felt to be most appropriate for this patient at this time.  The patient did not have access to video technology/had technical difficulties with video requiring transitioning to audio format only (telephone).  All issues noted in this document were discussed and addressed.  No physical exam could be performed with this format.  Please refer to the patient's chart for his consent to telehealth for [facility].  Evaluation Performed:  Preoperative cardiovascular risk assessment _____________   Date:  10/28/2022   Patient ID:  Lee Mclaughlin, DOB 19-May-1951, MRN [id number] Patient Location:  Home Provider location:   Office  Primary Care Provider:  [name], MD Primary Cardiologist:  Pixie Casino, MD  Chief Complaint / Patient Profile   71 y.o. y/o male with a h/o hyperlipidemia, coronary artery disease, atrial flutter, cardiomyopathy who is pending colonoscopy and presents today for telephonic preoperative cardiovascular risk assessment.  Past Medical History    Past Medical History:  Diagnosis Date   Coronary artery disease    COVID-19    In december 2020   Dyslipidemia    Dysrhythmia    a flutter   GERD (gastroesophageal reflux disease)    H/O cardiovascular stress test 10/19/2012   MET test - good effort, peak VO2 of almost 112% predicted, HR 79% predicted, mildly ischemia at end of exercise   History of hyperthyroidism as a child    History of nuclear stress test 03/2011   bruce myoview; mild perfusion defect in mid anteroseptal, apical septal, apical regions (infarct/scar/breast attenuation); post-stress EF 55%; no signficant ischemia   Ischemic cardiomyopathy    history of    Myocardial infarction ([place])    Pneumonia    as a kid   PONV (postoperative nausea and vomiting)     Primary localized osteoarthritis of right hip 05/24/2019   Primary localized osteoarthritis of right knee    Restless leg    right total hip arthroplasty with dislocation of hip ([place]) 08/14/2019   S/P CABG (coronary artery bypass graft) 2000   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 Gerrie Nordmann)    [place]     COLONOSCOPY     CORONARY ARTERY BYPASS GRAFT  06/05/1999   LIMA to LAD, SVG to diagonal 1, SVG to PDA of Cfx, right radial graft to OM1 (Dr. Remus Loffler)    ESOPHAGOGASTRODUODENOSCOPY     EYE SURGERY     KNEE SURGERY Right    pt. states x 6    TOTAL HIP ARTHROPLASTY Right 05/24/2019   Procedure: TOTAL HIP ARTHROPLASTY;  Surgeon: Marchia Bond, MD;  Location: [facility];  Service: Orthopedics;  Laterality: Right;   TOTAL HIP REVISION Right 03/15/2020   Procedure: TOTAL HIP REVISION, posterior approach;  Surgeon: Paralee Cancel, MD;  Location: [facility];  Service: Orthopedics;  Laterality: Right;  90 mins posterior approach   TOTAL KNEE ARTHROPLASTY Right 07/23/2015   Procedure: RIGHT TOTAL KNEE ARTHROPLASTY;  Surgeon: Elsie Saas, MD;  Location: [place];  Service: Orthopedics;  Laterality: Right;   TRANSFORAMINAL LUMBAR INTERBODY FUSION (TLIF) WITH PEDICLE SCREW FIXATION 2 LEVEL Left 11/27/2021   Procedure: LEFT-SIDED LUMBAR 3- LUMBAR 4, LUMBAR 4 - LUMBAR 5 TRANSFORAMINAL LUMBAR INTERBODY FUSION WITH  INSTRUMENTATION AND ALLOGRAFT;  Surgeon: Phylliss Bob, MD;  Location: [place];  Service: Orthopedics;  Laterality: Left;   TRANSTHORACIC ECHOCARDIOGRAM  02/2008   EF 40%, severe apical wall hypokinesis, mod-severe anterior wall hypokinesis; RV mildly dilated; mild mitral annular calcif; mild TR, RSVP 30-68mHg    Allergies  Allergies  Allergen Reactions   Morphine And Related Nausea And Vomiting   Crestor [Rosuvastatin] Other (See Comments)    Myalgias    Vytorin [Ezetimibe-Simvastatin] Other (See Comments)    Myalgias     History of Present Illness    Lee STANISZEWSKIis a 71y.o. male who presents via audio/video conferencing for a telehealth visit today.  Pt was last seen in cardiology clinic on 11/21/2021 by Dr. HDebara Pickett  At that time Lee MIHALIKwas doing well .  The patient is now pending procedure as outlined above. Since his last visit, he remains stable from a cardiac standpoint  Today he denies chest pain, shortness of breath, lower extremity edema, fatigue, palpitations, melena, hematuria, hemoptysis, diaphoresis, weakness, presyncope, syncope, orthopnea, and PND.  Home Medications    Prior to Admission medications   Medication Sig Start Date End Date Taking? Authorizing Provider  albuterol (VENTOLIN HFA) 108 (90 Base) MCG/ACT inhaler Inhale 2 puffs into the lungs every 6 (six) hours as needed for wheezing or shortness of breath.    [provider]  carvedilol (COREG) 12.5 MG tablet Take 12.5 mg by mouth 2 (two) times daily with a meal.    [provider]  gabapentin (NEURONTIN) 600 MG tablet Take 1 tablet (600 mg total) by mouth at bedtime. Patient taking differently: Take 300-600 mg by mouth See admin instructions. 300 mg daily as needed, 600 mg at bedtime 10/09/21   OSherrilyn RistA, MD  hydrOXYzine (ATARAX) 25 MG tablet Take 25 mg by mouth at bedtime as needed (sleep).    [provider]  loratadine (CLARITIN) 10 MG tablet Take 10 mg by mouth  daily.    [provider]  methocarbamol (ROBAXIN) 500 MG tablet Take 1 tablet (500 mg total) by mouth every 6 (six) hours as needed for muscle spasms. 11/28/21   DPhylliss Bob MD  olmesartan (BENICAR) 40 MG tablet Take 40 mg by mouth every morning.     [provider]  oxyCODONE-acetaminophen (PERCOCET/ROXICET) 5-325 MG tablet Take 1-2 tablets by mouth every 4 (four) hours as needed for severe pain. 11/28/21   DPhylliss Bob MD  REPATHA SURECLICK 1295MG/ML SOAJ INJECT 140 MG INTO THE SKIN EVERY 14 (FOURTEEN) DAYS. 05/30/22   Hilty, KNadean Corwin MD  tiZANidine (ZANAFLEX) 4 MG tablet Take 4 mg by mouth every evening. 10/03/21   [provider]    Physical Exam    Vital Signs:  Lee Caodoes not have vital signs available for review today.  Given telephonic nature of communication, physical exam is limited. AAOx3. NAD. Normal affect.  Speech and respirations are unlabored.  Accessory Clinical Findings    None  Assessment &  Plan    1.  Preoperative Cardiovascular Risk Assessment: Colonoscopy, Dr. Benson Norway, [facility]   Primary Cardiologist: Pixie Casino, MD  Chart reviewed as part of pre-operative protocol coverage. Given past medical history and time since last visit, based on ACC/AHA guidelines, Lee Mclaughlin would be at acceptable risk for the planned procedure without further cardiovascular testing.   Patient was advised that if he develops new symptoms prior to surgery to contact our office to arrange a follow-up appointment.  He verbalized understanding.  I will route this recommendation to the requesting party via Epic fax function and remove from pre-op pool.    Time:   Today, I have spent 5 minutes with the patient with telehealth technology discussing medical history, symptoms, and management plan.     Deberah Pelton, NP  10/28/2022, 7:43 AM

## 2022-11-03 ENCOUNTER — Other Ambulatory Visit: Payer: Self-pay | Admitting: Pulmonary Disease

## 2022-12-29 ENCOUNTER — Other Ambulatory Visit: Payer: Self-pay | Admitting: Pulmonary Disease

## 2023-04-20 ENCOUNTER — Emergency Department (HOSPITAL_COMMUNITY)
Admission: EM | Admit: 2023-04-20 | Discharge: 2023-04-20 | Disposition: A | Discharge status: Home / Self Care | Payer: PPO | Source: Home / Self Care | Attending: Emergency Medicine | Admitting: Emergency Medicine

## 2023-04-20 ENCOUNTER — Inpatient Hospital Stay (HOSPITAL_COMMUNITY)
Admission: EM | Admit: 2023-04-20 | Discharge: 2023-04-25 | DRG: 455 | Disposition: A | Discharge status: Home / Self Care | Payer: PPO | Attending: Orthopedic Surgery | Admitting: Orthopedic Surgery

## 2023-04-20 ENCOUNTER — Other Ambulatory Visit: Payer: Self-pay

## 2023-04-20 ENCOUNTER — Emergency Department (HOSPITAL_COMMUNITY): Payer: PPO

## 2023-04-20 ENCOUNTER — Encounter (HOSPITAL_COMMUNITY): Payer: Self-pay | Admitting: Emergency Medicine

## 2023-04-20 DIAGNOSIS — Z79899 Other long term (current) drug therapy: Secondary | ICD-10-CM

## 2023-04-20 DIAGNOSIS — I2510 Atherosclerotic heart disease of native coronary artery without angina pectoris: Secondary | ICD-10-CM | POA: Insufficient documentation

## 2023-04-20 DIAGNOSIS — Z885 Allergy status to narcotic agent status: Secondary | ICD-10-CM

## 2023-04-20 DIAGNOSIS — G5711 Meralgia paresthetica, right lower limb: Secondary | ICD-10-CM | POA: Insufficient documentation

## 2023-04-20 DIAGNOSIS — Z791 Long term (current) use of non-steroidal anti-inflammatories (NSAID): Secondary | ICD-10-CM

## 2023-04-20 DIAGNOSIS — K5900 Constipation, unspecified: Secondary | ICD-10-CM | POA: Diagnosis not present

## 2023-04-20 DIAGNOSIS — G8929 Other chronic pain: Secondary | ICD-10-CM | POA: Diagnosis present

## 2023-04-20 DIAGNOSIS — Z6835 Body mass index (BMI) 35.0-35.9, adult: Secondary | ICD-10-CM

## 2023-04-20 DIAGNOSIS — Z951 Presence of aortocoronary bypass graft: Secondary | ICD-10-CM | POA: Insufficient documentation

## 2023-04-20 DIAGNOSIS — F17290 Nicotine dependence, other tobacco product, uncomplicated: Secondary | ICD-10-CM | POA: Diagnosis present

## 2023-04-20 DIAGNOSIS — Z9049 Acquired absence of other specified parts of digestive tract: Secondary | ICD-10-CM

## 2023-04-20 DIAGNOSIS — Z96651 Presence of right artificial knee joint: Secondary | ICD-10-CM | POA: Diagnosis present

## 2023-04-20 DIAGNOSIS — M48061 Spinal stenosis, lumbar region without neurogenic claudication: Secondary | ICD-10-CM | POA: Diagnosis not present

## 2023-04-20 DIAGNOSIS — M5416 Radiculopathy, lumbar region: Secondary | ICD-10-CM | POA: Diagnosis not present

## 2023-04-20 DIAGNOSIS — I252 Old myocardial infarction: Secondary | ICD-10-CM

## 2023-04-20 DIAGNOSIS — N289 Disorder of kidney and ureter, unspecified: Secondary | ICD-10-CM | POA: Diagnosis present

## 2023-04-20 DIAGNOSIS — Z888 Allergy status to other drugs, medicaments and biological substances status: Secondary | ICD-10-CM

## 2023-04-20 DIAGNOSIS — G2581 Restless legs syndrome: Secondary | ICD-10-CM | POA: Diagnosis present

## 2023-04-20 DIAGNOSIS — I10 Essential (primary) hypertension: Secondary | ICD-10-CM | POA: Diagnosis present

## 2023-04-20 DIAGNOSIS — Z8616 Personal history of COVID-19: Secondary | ICD-10-CM

## 2023-04-20 DIAGNOSIS — I255 Ischemic cardiomyopathy: Secondary | ICD-10-CM | POA: Diagnosis present

## 2023-04-20 DIAGNOSIS — Z8249 Family history of ischemic heart disease and other diseases of the circulatory system: Secondary | ICD-10-CM

## 2023-04-20 DIAGNOSIS — M5116 Intervertebral disc disorders with radiculopathy, lumbar region: Secondary | ICD-10-CM | POA: Diagnosis present

## 2023-04-20 DIAGNOSIS — G5701 Lesion of sciatic nerve, right lower limb: Secondary | ICD-10-CM | POA: Diagnosis present

## 2023-04-20 DIAGNOSIS — K219 Gastro-esophageal reflux disease without esophagitis: Secondary | ICD-10-CM | POA: Diagnosis present

## 2023-04-20 DIAGNOSIS — R112 Nausea with vomiting, unspecified: Secondary | ICD-10-CM | POA: Diagnosis not present

## 2023-04-20 DIAGNOSIS — E669 Obesity, unspecified: Secondary | ICD-10-CM | POA: Diagnosis present

## 2023-04-20 DIAGNOSIS — Z96641 Presence of right artificial hip joint: Secondary | ICD-10-CM | POA: Diagnosis present

## 2023-04-20 DIAGNOSIS — M4726 Other spondylosis with radiculopathy, lumbar region: Secondary | ICD-10-CM | POA: Diagnosis present

## 2023-04-20 DIAGNOSIS — E785 Hyperlipidemia, unspecified: Secondary | ICD-10-CM | POA: Diagnosis present

## 2023-04-20 DIAGNOSIS — Z83438 Family history of other disorder of lipoprotein metabolism and other lipidemia: Secondary | ICD-10-CM

## 2023-04-20 LAB — CBC
HCT: 43.9 % (ref 39.0–52.0)
Hemoglobin: 15 g/dL (ref 13.0–17.0)
MCH: 32.1 pg (ref 26.0–34.0)
MCHC: 34.2 g/dL (ref 30.0–36.0)
MCV: 94 fL (ref 80.0–100.0)
Platelets: 147 10*3/uL — ABNORMAL LOW (ref 150–400)
RBC: 4.67 MIL/uL (ref 4.22–5.81)
RDW: 12.5 % (ref 11.5–15.5)
WBC: 7.7 10*3/uL (ref 4.0–10.5)
nRBC: 0 % (ref 0.0–0.2)

## 2023-04-20 LAB — BASIC METABOLIC PANEL
Anion gap: 7 (ref 5–15)
BUN: 24 mg/dL — ABNORMAL HIGH (ref 8–23)
CO2: 21 mmol/L — ABNORMAL LOW (ref 22–32)
Calcium: 7.1 mg/dL — ABNORMAL LOW (ref 8.9–10.3)
Chloride: 111 mmol/L (ref 98–111)
Creatinine, Ser: 0.73 mg/dL (ref 0.61–1.24)
GFR, Estimated: >60 mL/min (ref >60)
Glucose, Bld: 89 mg/dL (ref 70–99)
Potassium: 3.4 mmol/L — ABNORMAL LOW (ref 3.5–5.1)
Sodium: 139 mmol/L (ref 135–145)

## 2023-04-20 LAB — COMPREHENSIVE METABOLIC PANEL
ALT: 20 U/L (ref 0–44)
AST: 25 U/L (ref 15–41)
Albumin: 4 g/dL (ref 3.5–5.0)
Alkaline Phosphatase: 37 U/L — ABNORMAL LOW (ref 38–126)
Anion gap: 11 (ref 5–15)
BUN: 23 mg/dL (ref 8–23)
CO2: 22 mmol/L (ref 22–32)
Calcium: 9.6 mg/dL (ref 8.9–10.3)
Chloride: 101 mmol/L (ref 98–111)
Creatinine, Ser: 1 mg/dL (ref 0.61–1.24)
GFR, Estimated: >60 mL/min (ref >60)
Glucose, Bld: 115 mg/dL — ABNORMAL HIGH (ref 70–99)
Potassium: 4.2 mmol/L (ref 3.5–5.1)
Sodium: 134 mmol/L — ABNORMAL LOW (ref 135–145)
Total Bilirubin: 1.4 mg/dL — ABNORMAL HIGH (ref 0.3–1.2)
Total Protein: 6.9 g/dL (ref 6.5–8.1)

## 2023-04-20 LAB — CBC WITH DIFFERENTIAL/PLATELET
Abs Immature Granulocytes: 0.03 10*3/uL (ref 0.00–0.07)
Basophils Absolute: 0.1 10*3/uL (ref 0.0–0.1)
Basophils Relative: 1 %
Eosinophils Absolute: 0.3 10*3/uL (ref 0.0–0.5)
Eosinophils Relative: 4 %
HCT: 48 % (ref 39.0–52.0)
Hemoglobin: 16.5 g/dL (ref 13.0–17.0)
Immature Granulocytes: 0 %
Lymphocytes Relative: 32 %
Lymphs Abs: 2.5 10*3/uL (ref 0.7–4.0)
MCH: 31.9 pg (ref 26.0–34.0)
MCHC: 34.4 g/dL (ref 30.0–36.0)
MCV: 92.8 fL (ref 80.0–100.0)
Monocytes Absolute: 0.7 10*3/uL (ref 0.1–1.0)
Monocytes Relative: 9 %
Neutro Abs: 4.2 10*3/uL (ref 1.7–7.7)
Neutrophils Relative %: 54 %
Platelets: 160 10*3/uL (ref 150–400)
RBC: 5.17 MIL/uL (ref 4.22–5.81)
RDW: 12.2 % (ref 11.5–15.5)
WBC: 7.6 10*3/uL (ref 4.0–10.5)
nRBC: 0 % (ref 0.0–0.2)

## 2023-04-20 MED ORDER — LIDOCAINE 5 % EX PTCH
1.0000 | MEDICATED_PATCH | Freq: Once | CUTANEOUS | Status: DC
  Administered 2023-04-20: 1 via TRANSDERMAL
  Filled 2023-04-20: qty 1

## 2023-04-20 MED ORDER — LIDOCAINE 5 % EX PTCH: 1.0000 | MEDICATED_PATCH | Freq: Every day | CUTANEOUS | 0 refills | Status: DC | PRN

## 2023-04-20 MED ORDER — ONDANSETRON HCL 4 MG/2ML IJ SOLN
4.0000 mg | Freq: Once | INTRAMUSCULAR | Status: AC
Start: 2023-04-20 — End: 2023-04-20
  Administered 2023-04-20: 4 mg via INTRAVENOUS
  Filled 2023-04-20: qty 2

## 2023-04-20 MED ORDER — LORAZEPAM 1 MG PO TABS
1.0000 mg | ORAL_TABLET | Freq: Once | ORAL | Status: AC
  Administered 2023-04-20: 1 mg via ORAL
  Filled 2023-04-20: qty 1

## 2023-04-20 MED ORDER — HYDROMORPHONE HCL 1 MG/ML IJ SOLN
1.0000 mg | Freq: Once | INTRAMUSCULAR | Status: AC
  Administered 2023-04-20: 1 mg via INTRAVENOUS
  Filled 2023-04-20: qty 1

## 2023-04-20 MED ORDER — OXYCODONE HCL 5 MG PO TABS
5.0000 mg | ORAL_TABLET | Freq: Four times a day (QID) | ORAL | 0 refills | Status: DC | PRN
Start: 2023-04-20 — End: 2023-04-25

## 2023-04-20 MED ORDER — SODIUM CHLORIDE 0.9 % IV BOLUS
1000.0000 mL | Freq: Once | INTRAVENOUS | Status: AC
  Administered 2023-04-20: 1000 mL via INTRAVENOUS

## 2023-04-20 MED ORDER — HYDROMORPHONE HCL 1 MG/ML IJ SOLN
1.0000 mg | Freq: Once | INTRAMUSCULAR | Status: AC
  Administered 2023-04-21: 1 mg via INTRAVENOUS
  Filled 2023-04-20: qty 1

## 2023-04-20 MED ORDER — ACETAMINOPHEN 500 MG PO TABS
1000.0000 mg | ORAL_TABLET | Freq: Once | ORAL | Status: AC
Start: 2023-04-20 — End: 2023-04-20
  Administered 2023-04-20: 1000 mg via ORAL
  Filled 2023-04-20: qty 2

## 2023-04-20 MED ORDER — KETOROLAC TROMETHAMINE 15 MG/ML IJ SOLN
15.0000 mg | Freq: Once | INTRAMUSCULAR | Status: AC
Start: 2023-04-20 — End: 2023-04-20
  Administered 2023-04-20: 15 mg via INTRAVENOUS
  Filled 2023-04-20: qty 1

## 2023-04-20 NOTE — ED Notes (Signed)
Discharge instructions discussed with pt. Verbalized understanding. VSS. No questions or concerns regarding discharge  

## 2023-04-20 NOTE — Discharge Instructions (Addendum)
Please follow-up with your Primary Care Physician within the next week. Please take your medications as instructed and discuss any changes to your medications with your primary care physician.    Please return to the Emergency Department if you have any leg numbness, leg weakness, difficulty walking, fevers, worsening of pain, lightheadedness, lose consciousness, severe abdominal pain, severe headache, difficulty urinating, or difficulty having a bowel movement.   Please return to the emergency department immediately for any new or concerning symptoms, or if you get worse.   

## 2023-04-20 NOTE — ED Provider Notes (Signed)
[place] EMERGENCY DEPARTMENT AT [facility] Provider Note   CSN: [id number] Arrival date & time: 04/20/23  1748     History {Add pertinent medical, surgical, social history, OB history to HPI:1} Chief Complaint  Patient presents with   Back Pain    Lee Mclaughlin is a 72 y.o. male.   Back Pain    Patient has history of coronary artery disease, ischemic cardiomyopathy, lipidemia, osteoarthritis, myocardial infarction, total right hip arthroplasty, lumbar radiculopathy.  Patient states he has been having trouble with back pain for a while.  He previously had surgery by Dr. Yevette Edwards.  He was receiving treatment in that office.  Patient states he has been going there for therapy.  Recently has had an exacerbation of his pain and is now rating down his leg.  He saw his spine doctor and they were planning on doing an injection.  He was also considering sending him to pain management.  Patient states his symptoms worsened and he had to come to the ED earlier this morning at [facility] when he was eventually discharged about 3 AM.  Patient states he was feeling better at that time but the pain increased in severity.  He tried to get in touch with his orthopedic doctor but was unable to reach them.  They suggested he might need to come to the hospital to be admitted for pain management.  Any movement at all causes severe pain in his lower back that radiates down his right leg.  He denies any trouble with incontinence.  He does have some numbness but no acute weakness  Home Medications Prior to Admission medications   Medication Sig Start Date End Date Taking? Authorizing Provider  albuterol (VENTOLIN HFA) 108 (90 Base) MCG/ACT inhaler Inhale 2 puffs into the lungs every 6 (six) hours as needed for wheezing or shortness of breath.    [provider]  carvedilol (COREG) 12.5 MG tablet Take 12.5 mg by mouth 2 (two) times daily with a meal.    [provider]   gabapentin (NEURONTIN) 600 MG tablet TAKE 1 TABLET BY MOUTH AT BEDTIME. 11/03/22   Olalere, Adewale A, MD  hydrOXYzine (ATARAX) 25 MG tablet Take 25 mg by mouth at bedtime as needed (sleep).    [provider]  lidocaine (LIDODERM) 5 % Place 1 patch onto the skin daily as needed. Remove & Discard patch within 12 hours or as directed by MD 04/20/23   Sloan Leiter, DO  loratadine (CLARITIN) 10 MG tablet Take 10 mg by mouth daily.    [provider]  methocarbamol (ROBAXIN) 500 MG tablet Take 1 tablet (500 mg total) by mouth every 6 (six) hours as needed for muscle spasms. 11/28/21   Estill Bamberg, MD  olmesartan (BENICAR) 40 MG tablet Take 40 mg by mouth every morning.     [provider]  oxyCODONE (ROXICODONE) 5 MG immediate release tablet Take 1 tablet (5 mg total) by mouth every 6 (six) hours as needed for severe pain. 04/20/23   Sloan Leiter, DO  oxyCODONE-acetaminophen (PERCOCET/ROXICET) 5-325 MG tablet Take 1-2 tablets by mouth every 4 (four) hours as needed for severe pain. 11/28/21   Estill Bamberg, MD  REPATHA SURECLICK 140 MG/ML SOAJ INJECT 140 MG INTO THE SKIN EVERY 14 (FOURTEEN) DAYS. 05/30/22   Hilty, Lisette Abu, MD  tiZANidine (ZANAFLEX) 4 MG tablet Take 4 mg by mouth every evening. 10/03/21   [provider]      Allergies  Morphine and related, Crestor [rosuvastatin], and Vytorin [ezetimibe-simvastatin]    Review of Systems   Review of Systems  Musculoskeletal:  Positive for back pain.    Physical Exam Updated Vital Signs Ht 1.727 m (5\' 8" )   Wt 106 kg   BMI 35.53 kg/m  Physical Exam Vitals and nursing note reviewed.  Constitutional:      General: He is not in acute distress.    Appearance: He is well-developed.  HENT:     Head: Normocephalic and atraumatic.     Right Ear: External ear normal.     Left Ear: External ear normal.  Eyes:     General: No scleral icterus.       Right eye: No discharge.        Left eye: No  discharge.     Conjunctiva/sclera: Conjunctivae normal.  Neck:     Trachea: No tracheal deviation.  Cardiovascular:     Rate and Rhythm: Normal rate.  Pulmonary:     Effort: Pulmonary effort is normal. No respiratory distress.     Breath sounds: No stridor.  Abdominal:     General: There is no distension.  Musculoskeletal:        General: No swelling or deformity.     Cervical back: Neck supple.     Lumbar back: Spasms and tenderness present.  Skin:    General: Skin is warm and dry.     Findings: No rash.  Neurological:     Mental Status: He is alert. Mental status is at baseline.     Cranial Nerves: No dysarthria or facial asymmetry.     Motor: No seizure activity.     ED Results / Procedures / Treatments   Labs (all labs ordered are listed, but only abnormal results are displayed) Labs Reviewed - No data to display  EKG None  Radiology No results found.  Procedures Procedures  {Document cardiac monitor, telemetry assessment procedure when appropriate:1}  Medications Ordered in ED Medications  HYDROmorphone (DILAUDID) injection 1 mg (has no administration in time range)    ED Course/ Medical Decision Making/ A&P   {   Click here for ABCD2, HEART and other calculatorsREFRESH Note before signing :1}                          Medical Decision Making Amount and/or Complexity of Data Reviewed Labs: ordered. Radiology: ordered.  Risk Prescription drug management.   ***  {Document critical care time when appropriate:1} {Document review of labs and clinical decision tools ie heart score, Chads2Vasc2 etc:1}  {Document your independent review of radiology images, and any outside records:1} {Document your discussion with family members, caretakers, and with consultants:1} {Document social determinants of health affecting pt's care:1} {Document your decision making why or why not admission, treatments were needed:1} Final Clinical Impression(s) / ED  Diagnoses Final diagnoses:  None    Rx / DC Orders ED Discharge Orders     None

## 2023-04-20 NOTE — ED Triage Notes (Signed)
BIBA from home for chonic back pain, pt has home prescription for pain and tried them without success. A&Ox4

## 2023-04-20 NOTE — ED Triage Notes (Signed)
Patient BIB EMS from home c/o back pain. Pt report worsening back pain even with prescription home pain meds. Pt had fentanyl given by EMS. Pt a/ox4. Pt denies N/V. Pt was seend this morning with same problem

## 2023-04-20 NOTE — ED Provider Notes (Signed)
[place] EMERGENCY DEPARTMENT AT [facility] Provider Note  CSN: [id number] Arrival date & time: 04/20/23 0009  Chief Complaint(s) Back Pain  HPI Lee Mclaughlin is a 72 y.o. male with past medical history as below, significant for CAD status post CABG 2000, ischemic cardiomyopathy, HLD, chronic low back pain who presents to the ED with complaint of low back pain.  Follows with Dr. Yevette Edwards (s/p L lumbar fusion 12/22). Worsening right-sided low back, upper thigh pain for the past 2 to 3 days.  No falls or provoking injuries.  Difficulty walking over the past 2 days secondary to worsening pain.  No fevers or chills, no numbness or tingling seems new.  No change in bowel or bladder function.  No IV drug use.  Pain primarily over his right gluteal region, no pain specifically to his lumbar spine.  Does get frequent spinal injections, injection approximately 3 days ago per the patient.  His pain specialist increase his gabapentin which she started yesterday without much improvement to his pain.  He took a leftover oxycodone around 8 or 9 PM this evening which did mildly improve his discomfort.  Denies abdominal pain, chest pain, no nausea or vomiting, no numbness or tingling seems new.  Past Medical History Past Medical History:  Diagnosis Date   Coronary artery disease    COVID-19    In december 2020   Dyslipidemia    Dysrhythmia    a flutter   GERD (gastroesophageal reflux disease)    H/O cardiovascular stress test 10/19/2012   MET test - good effort, peak VO2 of almost 112% predicted, HR 79% predicted, mildly ischemia at end of exercise   History of hyperthyroidism as a child    History of nuclear stress test 03/2011   bruce myoview; mild perfusion defect in mid anteroseptal, apical septal, apical regions (infarct/scar/breast attenuation); post-stress EF 55%; no signficant ischemia   Ischemic cardiomyopathy    history of    Myocardial infarction (HCC)    Pneumonia    as a kid    PONV (postoperative nausea and vomiting)    Primary localized osteoarthritis of right hip 05/24/2019   Primary localized osteoarthritis of right knee    Restless leg    right total hip arthroplasty with dislocation of hip (HCC) 08/14/2019   S/P CABG (coronary artery bypass graft) 2000   Staphylococcal arthritis of right knee (HCC) 2004   Wears glasses    Patient Active Problem List   Diagnosis Date Noted   Obese 03/16/2020   S/P right TH revision 03/15/2020   Pre-operative clearance 02/23/2020   Statin myopathy 12/07/2019   right total hip arthroplasty with dislocation of hip (HCC) 08/14/2019   Primary localized osteoarthritis of right hip 05/24/2019   Mixed hyperlipidemia 03/21/2019   Atrial flutter (HCC) 11/02/2018   Typical atrial flutter (HCC) 09/24/2018   Cough 03/19/2018   Erectile dysfunction 03/19/2018   Seasonal allergic rhinitis due to pollen 03/19/2018   Inflamed seborrheic keratosis 10/31/2016   Primary localized osteoarthritis of right knee 09/22/2016   Staphylococcal arthritis of right knee (HCC) 09/22/2016   DJD (degenerative joint disease) of knee 07/23/2015   CAD (coronary artery disease) 11/22/2013   S/P CABG x 4 11/22/2013   Dyslipidemia 11/22/2013   Cardiomyopathy (HCC) 11/22/2013   Statin intolerance 11/22/2013   Home Medication(s) Prior to Admission medications   Medication Sig Start Date End Date Taking? Authorizing Provider  albuterol (VENTOLIN HFA) 108 (90 Base) MCG/ACT inhaler Inhale 2 puffs into the lungs  every 6 (six) hours as needed for wheezing or shortness of breath.    [provider]  carvedilol (COREG) 12.5 MG tablet Take 12.5 mg by mouth 2 (two) times daily with a meal.    [provider]  gabapentin (NEURONTIN) 600 MG tablet TAKE 1 TABLET BY MOUTH AT BEDTIME. 11/03/22   Olalere, Adewale A, MD  hydrOXYzine (ATARAX) 25 MG tablet Take 25 mg by mouth at bedtime as needed (sleep).    [provider]  loratadine  (CLARITIN) 10 MG tablet Take 10 mg by mouth daily.    [provider]  methocarbamol (ROBAXIN) 500 MG tablet Take 1 tablet (500 mg total) by mouth every 6 (six) hours as needed for muscle spasms. 11/28/21   Estill Bamberg, MD  olmesartan (BENICAR) 40 MG tablet Take 40 mg by mouth every morning.     [provider]  oxyCODONE-acetaminophen (PERCOCET/ROXICET) 5-325 MG tablet Take 1-2 tablets by mouth every 4 (four) hours as needed for severe pain. 11/28/21   Estill Bamberg, MD  REPATHA SURECLICK 140 MG/ML SOAJ INJECT 140 MG INTO THE SKIN EVERY 14 (FOURTEEN) DAYS. 05/30/22   Hilty, Lisette Abu, MD  tiZANidine (ZANAFLEX) 4 MG tablet Take 4 mg by mouth every evening. 10/03/21   [provider]                                                                                                                                    Past Surgical History Past Surgical History:  Procedure Laterality Date   A-FLUTTER ABLATION N/A 10/01/2018   Procedure: A-FLUTTER ABLATION;  Surgeon: Marinus Maw, MD;  Location: [facility] INVASIVE CV LAB;  Service: Cardiovascular;  Laterality: N/A;   A-FLUTTER ABLATION N/A 11/02/2018   Procedure: A-FLUTTER ABLATION;  Surgeon: Marinus Maw, MD;  Location: [facility] INVASIVE CV LAB;  Service: Cardiovascular;  Laterality: N/A;   CARDIAC CATHETERIZATION  06/01/2007   no obstructive CAD, patent grafts, EF 40%, apical hypokinesis, anterolateral hypokinesis (Dr. Laurell Josephs)    CERVICAL SPINE SURGERY     CHOLECYSTECTOMY     COLONOSCOPY     CORONARY ARTERY BYPASS GRAFT  06/05/1999   LIMA to LAD, SVG to diagonal 1, SVG to PDA of Cfx, right radial graft to OM1 (Dr. Molinda Bailiff)    ESOPHAGOGASTRODUODENOSCOPY     EYE SURGERY     KNEE SURGERY Right    pt. states x 6    TOTAL HIP ARTHROPLASTY Right 05/24/2019   Procedure: TOTAL HIP ARTHROPLASTY;  Surgeon: Teryl Lucy, MD;  Location: [facility] ORS;  Service: Orthopedics;  Laterality: Right;   TOTAL HIP REVISION Right 03/15/2020    Procedure: TOTAL HIP REVISION, posterior approach;  Surgeon: Durene Romans, MD;  Location: [facility] ORS;  Service: Orthopedics;  Laterality: Right;  90 mins posterior approach   TOTAL KNEE ARTHROPLASTY Right 07/23/2015   Procedure: RIGHT TOTAL KNEE ARTHROPLASTY;  Surgeon: Salvatore Marvel, MD;  Location: [facility]  OR;  Service: Orthopedics;  Laterality: Right;   TRANSFORAMINAL LUMBAR INTERBODY FUSION (TLIF) WITH PEDICLE SCREW FIXATION 2 LEVEL Left 11/27/2021   Procedure: LEFT-SIDED LUMBAR 3- LUMBAR 4, LUMBAR 4 - LUMBAR 5 TRANSFORAMINAL LUMBAR INTERBODY FUSION WITH INSTRUMENTATION AND ALLOGRAFT;  Surgeon: Estill Bamberg, MD;  Location: [facility] OR;  Service: Orthopedics;  Laterality: Left;   TRANSTHORACIC ECHOCARDIOGRAM  02/2008   EF 40%, severe apical wall hypokinesis, mod-severe anterior wall hypokinesis; RV mildly dilated; mild mitral annular calcif; mild TR, RSVP 30-49mmHg   Family History Family History  Problem Relation Age of Onset   CAD Father        s/p CABGx4 at 80   Hyperlipidemia Father     Social History Social History   Tobacco Use   Smoking status: Some Days    Types: Cigars   Smokeless tobacco: Never   Tobacco comments:    cigar occassionally  Vaping Use   Vaping Use: Never used  Substance Use Topics   Alcohol use: Yes    Alcohol/week: 0.0 standard drinks of alcohol    Comment: minimal   Drug use: No   Allergies Morphine and related, Crestor [rosuvastatin], and Vytorin [ezetimibe-simvastatin]  Review of Systems Review of Systems  Constitutional:  Negative for chills and fever.  HENT:  Negative for facial swelling and trouble swallowing.   Eyes:  Negative for photophobia and visual disturbance.  Respiratory:  Negative for cough and shortness of breath.   Cardiovascular:  Negative for chest pain and palpitations.  Gastrointestinal:  Negative for abdominal pain, nausea and vomiting.  Endocrine: Negative for polydipsia and polyuria.  Genitourinary:  Negative for difficulty urinating  and hematuria.  Musculoskeletal:  Positive for arthralgias. Negative for joint swelling and neck stiffness.  Skin:  Negative for pallor and rash.  Neurological:  Negative for syncope and headaches.  Psychiatric/Behavioral:  Negative for agitation and confusion.     Physical Exam Vital Signs  I have reviewed the triage vital signs BP 130/71   Pulse (!) 48   Temp (!) 97.4 F (36.3 C) (Oral)   Resp 13   SpO2 94%  Physical Exam Vitals and nursing note reviewed.  Constitutional:      General: He is not in acute distress.    Appearance: Normal appearance. He is well-developed. He is not ill-appearing.  HENT:     Head: Normocephalic and atraumatic.     Right Ear: External ear normal.     Left Ear: External ear normal.     Mouth/Throat:     Mouth: Mucous membranes are moist.  Eyes:     General: No scleral icterus. Cardiovascular:     Rate and Rhythm: Normal rate and regular rhythm.     Pulses: Normal pulses.     Heart sounds: Normal heart sounds.  Pulmonary:     Effort: Pulmonary effort is normal. No respiratory distress.     Breath sounds: Normal breath sounds.  Abdominal:     General: Abdomen is flat.     Palpations: Abdomen is soft.     Tenderness: There is no abdominal tenderness.  Musculoskeletal:        General: Normal range of motion.     Cervical back: Normal range of motion.     Right lower leg: No edema.     Left lower leg: No edema.       Legs:     Comments: No midline spinous process tenderness to palpation or percussion, no crepitus or step-off.    TTP  over piriformis muscle  Negative SLR bilateral  LE NVI bilateral  Skin:    General: Skin is warm and dry.     Capillary Refill: Capillary refill takes less than 2 seconds.  Neurological:     Mental Status: He is alert and oriented to person, place, and time.     GCS: GCS eye subscore is 4. GCS verbal subscore is 5. GCS motor subscore is 6.     Sensory: Sensation is intact.     Motor: Motor function is  intact.     Coordination: Coordination is intact.  Psychiatric:        Mood and Affect: Mood normal.        Behavior: Behavior normal.     ED Results and Treatments Labs (all labs ordered are listed, but only abnormal results are displayed) Labs Reviewed  COMPREHENSIVE METABOLIC PANEL - Abnormal; Notable for the following components:      Result Value   Sodium 134 (*)    Glucose, Bld 115 (*)    Alkaline Phosphatase 37 (*)    Total Bilirubin 1.4 (*)    All other components within normal limits  CBC WITH DIFFERENTIAL/PLATELET  URINALYSIS, ROUTINE W REFLEX MICROSCOPIC                                                                                                                          Radiology No results found.  Pertinent labs & imaging results that were available during my care of the patient were reviewed by me and considered in my medical decision making (see MDM for details).  Medications Ordered in ED Medications  lidocaine (LIDODERM) 5 % 1 patch (1 patch Transdermal Patch Applied 04/20/23 0203)  ketorolac (TORADOL) 15 MG/ML injection 15 mg (15 mg Intravenous Given 04/20/23 0039)  sodium chloride 0.9 % bolus 1,000 mL (0 mLs Intravenous Stopped 04/20/23 0146)  HYDROmorphone (DILAUDID) injection 1 mg (1 mg Intravenous Given 04/20/23 0043)  ondansetron (ZOFRAN) injection 4 mg (4 mg Intravenous Given 04/20/23 0040)  acetaminophen (TYLENOL) tablet 1,000 mg (1,000 mg Oral Given 04/20/23 0204)                                                                                                                                     Procedures Procedures  (including critical care time)  Medical Decision Making / ED Course    Medical Decision Making:  MANDIP FRAME is a 72 y.o. male with past medical history as below, significant for CAD status post CABG 2000, ischemic cardiomyopathy, HLD, chronic low back pain who presents to the ED with complaint of low back pain.. The complaint  involves an extensive differential diagnosis and also carries with it a high risk of complications and morbidity.  Serious etiology was considered. Ddx includes but is not limited to: Differential diagnosis includes but is not exclusive to musculoskeletal back pain, renal colic, urinary tract infection, pyelonephritis, intra-abdominal causes of back pain, aortic aneurysm or dissection, cauda equina syndrome, sciatica, lumbar disc disease, thoracic disc disease, etc.   Complete initial physical exam performed, notably the patient  was HDS. Reviewed and confirmed nursing documentation for past medical history, family history, social history.  Vital signs reviewed.    Clinical Course as of 04/20/23 0242  Mon Apr 20, 2023  0212 Labs reviewed, stable  [SG]  0212 Total Bilirubin(!): 1.4 Similar to prior  [SG]  0239 Feeling better on recheck, ambulatory, neuro non focal. Stable for dc with close o/p f/u [SG]    Clinical Course User Index [SG] Sloan Leiter, DO   Patient was told 3 days ago that he had "sciatica" and was given a was reported as a steroid injection.  He has not experienced relief of his pain since the injection. Pain primarily over the right gluteal muscles/piriformis muscle. Will get screening labs given history of prior spinal injections, there is no TTP midline.  No fevers. Give analgesics and reassess Feeling better on recheck  Concern for piriformis syndrome right sided, feeling better on recheck. Will d/c with short course of oxy, he is already on robaxin/nsaids at home. Give stretching exercises  Patient presents with low back pain without signs of spinal cord compression, cauda equina syndrome, infection, aneurysm, or other serious etiology. The patient is neurologically intact. Given the extremely low risk of these diagnoses further testing and evaluation for these possibilities does not appear to be indicated at this time. Detailed discussions were had with the patient  and/or family and caregivers, regarding current findings, and need for close f/u with PCP or on call doctor. The patient has been instructed to return immediately if the symptoms worsen in any way. Patient verbalized understanding and is in agreement with current care plan. All questions answered prior to discharge.      Additional history obtained: -Additional history obtained from na -External records from outside source obtained and reviewed including: Chart review including previous notes, labs, imaging, consultation notes including PDMP, primary care documentation, prior labs and imaging, home medications   Lab Tests: -I ordered, reviewed, and interpreted labs.   The pertinent results include:   Labs Reviewed  COMPREHENSIVE METABOLIC PANEL - Abnormal; Notable for the following components:      Result Value   Sodium 134 (*)    Glucose, Bld 115 (*)    Alkaline Phosphatase 37 (*)    Total Bilirubin 1.4 (*)    All other components within normal limits  CBC WITH DIFFERENTIAL/PLATELET  URINALYSIS, ROUTINE W REFLEX MICROSCOPIC    Notable for stable labs  EKG   EKG Interpretation  Date/Time:    Ventricular Rate:    PR Interval:    QRS Duration:   QT Interval:    QTC Calculation:   R Axis:     Text Interpretation:           Imaging Studies ordered: Imaging not indicated at this time   Medicines  ordered and prescription drug management: Meds ordered this encounter  Medications   ketorolac (TORADOL) 15 MG/ML injection 15 mg   sodium chloride 0.9 % bolus 1,000 mL   HYDROmorphone (DILAUDID) injection 1 mg   ondansetron (ZOFRAN) injection 4 mg   lidocaine (LIDODERM) 5 % 1 patch   acetaminophen (TYLENOL) tablet 1,000 mg    -I have reviewed the patients home medicines and have made adjustments as needed   Consultations Obtained: na   Cardiac Monitoring: na  Social Determinants of Health:  Diagnosis or treatment significantly limited by social determinants of  health: current smoker   Reevaluation: After the interventions noted above, I reevaluated the patient and found that they have improved  Co morbidities that complicate the patient evaluation  Past Medical History:  Diagnosis Date   Coronary artery disease    COVID-19    In december 2020   Dyslipidemia    Dysrhythmia    a flutter   GERD (gastroesophageal reflux disease)    H/O cardiovascular stress test 10/19/2012   MET test - good effort, peak VO2 of almost 112% predicted, HR 79% predicted, mildly ischemia at end of exercise   History of hyperthyroidism as a child    History of nuclear stress test 03/2011   bruce myoview; mild perfusion defect in mid anteroseptal, apical septal, apical regions (infarct/scar/breast attenuation); post-stress EF 55%; no signficant ischemia   Ischemic cardiomyopathy    history of    Myocardial infarction (HCC)    Pneumonia    as a kid   PONV (postoperative nausea and vomiting)    Primary localized osteoarthritis of right hip 05/24/2019   Primary localized osteoarthritis of right knee    Restless leg    right total hip arthroplasty with dislocation of hip (HCC) 08/14/2019   S/P CABG (coronary artery bypass graft) 2000   Staphylococcal arthritis of right knee (HCC) 2004   Wears glasses       Dispostion: Disposition decision including need for hospitalization was considered, and patient discharged from emergency department.    Final Clinical Impression(s) / ED Diagnoses Final diagnoses:  Piriformis syndrome, right     This chart was dictated using voice recognition software.  Despite best efforts to proofread,  errors can occur which can change the documentation meaning.    Sloan Leiter, DO 04/20/23 [phone]

## 2023-04-21 DIAGNOSIS — I2510 Atherosclerotic heart disease of native coronary artery without angina pectoris: Secondary | ICD-10-CM | POA: Diagnosis not present

## 2023-04-21 DIAGNOSIS — I10 Essential (primary) hypertension: Secondary | ICD-10-CM

## 2023-04-21 DIAGNOSIS — I2583 Coronary atherosclerosis due to lipid rich plaque: Secondary | ICD-10-CM

## 2023-04-21 DIAGNOSIS — M48061 Spinal stenosis, lumbar region without neurogenic claudication: Secondary | ICD-10-CM | POA: Diagnosis present

## 2023-04-21 DIAGNOSIS — E785 Hyperlipidemia, unspecified: Secondary | ICD-10-CM

## 2023-04-21 LAB — HIV ANTIBODY (ROUTINE TESTING W REFLEX): HIV Screen 4th Generation wRfx: NONREACTIVE

## 2023-04-21 MED ORDER — DEXAMETHASONE SODIUM PHOSPHATE 10 MG/ML IJ SOLN
10.0000 mg | Freq: Once | INTRAMUSCULAR | Status: AC
  Administered 2023-04-21: 10 mg via INTRAVENOUS
  Filled 2023-04-21: qty 1

## 2023-04-21 MED ORDER — LORATADINE 10 MG PO TABS
10.0000 mg | ORAL_TABLET | Freq: Every day | ORAL | Status: DC
  Administered 2023-04-21 – 2023-04-25 (×5): 10 mg via ORAL
  Filled 2023-04-21 (×5): qty 1

## 2023-04-21 MED ORDER — HEPARIN SODIUM (PORCINE) 5000 UNIT/ML IJ SOLN
5000.0000 [IU] | Freq: Three times a day (TID) | INTRAMUSCULAR | Status: DC
  Administered 2023-04-21 – 2023-04-22 (×6): 5000 [IU] via SUBCUTANEOUS
  Filled 2023-04-21 (×7): qty 1

## 2023-04-21 MED ORDER — DOCUSATE SODIUM 100 MG PO CAPS
100.0000 mg | ORAL_CAPSULE | Freq: Two times a day (BID) | ORAL | Status: DC
  Administered 2023-04-21 – 2023-04-23 (×4): 100 mg via ORAL
  Filled 2023-04-21 (×5): qty 1

## 2023-04-21 MED ORDER — GABAPENTIN 300 MG PO CAPS
600.0000 mg | ORAL_CAPSULE | Freq: Every day | ORAL | Status: DC
  Administered 2023-04-21 – 2023-04-23 (×3): 600 mg via ORAL
  Filled 2023-04-21 (×3): qty 2

## 2023-04-21 MED ORDER — CARVEDILOL 12.5 MG PO TABS
12.5000 mg | ORAL_TABLET | Freq: Two times a day (BID) | ORAL | Status: DC
  Administered 2023-04-21 – 2023-04-24 (×7): 12.5 mg via ORAL
  Filled 2023-04-21 (×7): qty 1

## 2023-04-21 MED ORDER — HYDROMORPHONE HCL 1 MG/ML IJ SOLN
0.5000 mg | INTRAMUSCULAR | Status: DC | PRN
  Administered 2023-04-21 (×4): 1 mg via INTRAVENOUS
  Filled 2023-04-21 (×4): qty 1

## 2023-04-21 MED ORDER — GABAPENTIN 300 MG PO CAPS
300.0000 mg | ORAL_CAPSULE | Freq: Two times a day (BID) | ORAL | Status: DC
  Administered 2023-04-21 – 2023-04-24 (×6): 300 mg via ORAL
  Filled 2023-04-21 (×6): qty 1

## 2023-04-21 MED ORDER — MELATONIN 5 MG PO TABS
10.0000 mg | ORAL_TABLET | Freq: Every evening | ORAL | Status: DC | PRN
  Administered 2023-04-21 – 2023-04-22 (×2): 10 mg via ORAL
  Filled 2023-04-21 (×2): qty 2

## 2023-04-21 MED ORDER — IRBESARTAN 300 MG PO TABS
300.0000 mg | ORAL_TABLET | Freq: Every day | ORAL | Status: DC
  Administered 2023-04-21 – 2023-04-24 (×4): 300 mg via ORAL
  Filled 2023-04-21: qty 2
  Filled 2023-04-21 (×2): qty 1
  Filled 2023-04-21: qty 2

## 2023-04-21 MED ORDER — MONTELUKAST SODIUM 10 MG PO TABS
10.0000 mg | ORAL_TABLET | Freq: Every day | ORAL | Status: DC
  Administered 2023-04-21 – 2023-04-25 (×5): 10 mg via ORAL
  Filled 2023-04-21 (×5): qty 1

## 2023-04-21 MED ORDER — HYDROMORPHONE HCL 2 MG PO TABS
2.0000 mg | ORAL_TABLET | ORAL | Status: DC | PRN
  Administered 2023-04-21 – 2023-04-24 (×8): 2 mg via ORAL
  Filled 2023-04-21 (×8): qty 1

## 2023-04-21 NOTE — Subjective & Objective (Signed)
CC: back pain HPI: 72 year old Caucasian male history of coronary disease status post CABG, hypertension, hyperlipidemia that is statin intolerant, history of a flutter status post ablation off anticoagulation, obesity BMI of 35.5 who presents to the ER today with continued back pain.  He was seen yesterday in the ER was treated for lumbar back pain.  He was given a prescription for oxycodone.  He went home and took oxycodone and it did not help.  He presents back to the ER for continued back pain.  He states that he has been seeing orthopedics for his back pain for several years.  He had severe spinal stenosis at the L3-4 L4-5 level in December 2022.  He had up getting a lumbar decompression at the L3-4 L4-5 level.  Over the last month, he has been getting injections in his lower back on the left side due to left-sided leg pain and radicular pain.  He began having increasing pain on the right side about 2 weeks ago.  He was seen in the orthopedic office last week.  They were trying to set up a lumbar injection for him this next week but still had to have insurance authorization.  Patient states the pain is so severe he cannot wait.  He came to the ER for pain control.  He states that as long as he is laying flat on his back, he does not have much pain but as soon as he tries to sit up or is standing, he has severe pain in the right side of his low back.  He has received multiple doses of IV pain medicine along with a dose of IV Decadron.  EDP is discussed the case with orthopedics Dr. Jerl Santos who will see the patient in consult.  [facility] hospitalist contacted for admission.

## 2023-04-21 NOTE — Progress Notes (Signed)
PT Cancellation Note  Patient Details Name: Lee Mclaughlin MRN: [id number] DOB: [birth year]   Cancelled Treatment:    Reason Eval/Treat Not Completed: Other (comment). Per conversation with RN, pt transferring to [facility] for back surgery anticipated for 5/16. PT order cancelled, will await new orders post-op.    Tori Kaleen Rochette PT, DPT 04/21/23, 11:04 AM

## 2023-04-21 NOTE — Assessment & Plan Note (Signed)
Observation med/surg bed. Pt given IV decadron in the ER. Orthopedics consulted. Pt states he was trying to get spinal injections in the orthopedics office last week but has not gotten insurance authorization for it yet. Po oxycodone given to him yesterday with ER Rx are ineffective. Prn po dilaudid.

## 2023-04-21 NOTE — Progress Notes (Signed)
PROGRESS NOTE    Lee Mclaughlin  GNF:[id number] DOB: 05/10/1951 DOA: 04/20/2023 PCP: Lee Banner, MD    Brief Narrative:   Lee Mclaughlin is a 72 y.o. male with past medical history significant for HTN, HLD (statin intolerant), CAD s/p CABG, history of atrial flutter s/p ablation off anticoagulation, obesity who presented to [facility] ED on 5/13 from home via EMS with complaints of back pain.  Patient was originally seen in the ED earlier in the day and prescribed oxycodone, which he reports did not help; so he sought further care in the ED once again.  Pain is worse while sitting up or in the standing position, improved while laying flat.  Patient reports has been seeing orthopedics for his back pain for several years, had severe spinal stenosis at the L3-4, L4-5 level in December 2022 in which she underwent decompression at that time.  Over the last month, he has been getting injections in his lower back/on the left side due to left-sided leg pain with radiculopathy.  Progressive pain has been ongoing for the last 2 weeks.  He was seen in orthopedic office last week and were trying to set up a lumbar injection but was still pending insurance authorization.  ED, temperature 98.0 F, HR 52, RR 16, BP 148/93, SpO2 95% on room air.  Sodium 134, potassium 4.2, chloride 101, CO2 22, glucose 115, BUN 23, creatinine 1.00.  AST 25, ALT 20, total bilirubin 1.4.  WBC 7.6, hemoglobin 16.5, platelets 160.  MR L-spine with multifactorial degenerative changes including a moderate-sized right foraminal to subarticular disc extrusion at L1-2, with resultant moderate to severe canal with right greater than left lateral recess stenosis with severe right and moderate left L1 foraminal narrowing, disc bulge with facet hypertrophy at L2-3 with resultant mid to moderate spinal stenosis, with mild bilateral L2 foraminal narrowing, noted prior PLIF at L3-4 and L4-5.  Orthopedics was consulted.  TR[facility] consulted for  admission for further evaluation management of lumbar spinal stenosis causing increased low back pain with radiculopathy.   Assessment & Plan:   Lumbar spinal stenosis with radiculopathy Patient presenting to ED with progressive low back pain x 2 weeks now with radiculopathy. MR L-spine with multifactorial degenerative changes including a moderate-sized right foraminal to subarticular disc extrusion at L1-2, with resultant moderate to severe canal with right greater than left lateral recess stenosis with severe right and moderate left L1 foraminal narrowing, disc bulge with facet hypertrophy at L2-3 with resultant mid to moderate spinal stenosis, with mild bilateral L2 foraminal narrowing, noted prior PLIF at L3-4 and L4-5.  -- Orthopedics following, appreciate assistance -- Dilaudid 2 mg p.o. every 4 hours as needed moderate pain -- Dilaudid 0.5-1 mg IV every 2 hours as needed severe pain  Essential hypertension -- Carvedilol 12.5 mg p.o. twice daily -- Irbesartan 300 mg p.o. daily (substituted for home olmesartan 40 mg PO daily)  CAD s/p CABG -- Not on aspirin or statin outpatient  HLD -- Not on medication outpatient, intolerant to statins  Obesity Body mass index is 35.53 kg/m.  Discussed with patient needs for aggressive lifestyle changes/weight loss as this complicates all facets of care.  Outpatient follow-up with PCP.      DVT prophylaxis: heparin injection 5,000 Units Start: 04/21/23 0600 SCDs Start: 04/21/23 0535    Code Status: Full Code Family Communication: No family present at bedside this morning  Disposition Plan:  Level of care: Med-Surg Status is: Observation The patient remains  OBS appropriate and will d/c before 2 midnights.    Consultants:  Orthopedics, Dr. Yevette Edwards  Procedures:  Pending 1/2 decompression/fusion on 5/16  Antimicrobials:  None   Subjective: Patient seen examined bedside, resting comfortably.  Lying in bed.  Talking to spouse on  telephone.  Seen by orthopedics this morning with recommendation of transfer to [facility] for planned surgery on 5/16.  Pain currently controlled while laying flat, reports IV pain medication usually lasting roughly 6 hours.  Does report some right-sided groin numbness which she relates it to his sciatica.  Able to urinate without issue.  No other specific questions or concerns at this time.  Denies headache, no dizziness, no chest pain, no palpitations, no shortness of breath, no abdominal pain, no fever/chills/night sweats, no nausea/vomiting/diarrhea, no focal weakness, no fatigue.  Objective: Vitals:   04/21/23 0415 04/21/23 0418 04/21/23 0503 04/21/23 0933  BP: 109/82  (!) 148/86 (!) 147/87  Pulse: (!) 55  (!) 59 (!) 59  Resp:   17 18  Temp:  98.2 F (36.8 C) 97.7 F (36.5 C) 97.9 F (36.6 C)  TempSrc:  Oral Oral Oral  SpO2: 93%  99% 95%  Weight:      Height:        Intake/Output Summary (Last 24 hours) at 04/21/2023 1016 Last data filed at 04/21/2023 0931 Gross per 24 hour  Intake 240 ml  Output 400 ml  Net -160 ml   Filed Weights   04/20/23 1758  Weight: 106 kg    Examination:  Physical Exam: GEN: NAD, alert and oriented x 3, obese HEENT: NCAT, PERRL, EOMI, sclera clear, MMM PULM: CTAB w/o wheezes/crackles, normal respiratory effort, on room air CV: RRR w/o M/G/R GI: abd soft, NTND, NABS, no R/G/M MSK: no peripheral edema, muscle strength globally intact 5/5 bilateral upper/lower extremities NEURO: CN II-XII intact, no focal deficits, sensation to light touch intact PSYCH: normal mood/affect Integumentary: dry/intact, no rashes or wounds    Data Reviewed: I have personally reviewed following labs and imaging studies  CBC: Recent Labs  Lab 04/20/23 0047 04/20/23 1931  WBC 7.6 7.7  NEUTROABS 4.2  --   HGB 16.5 15.0  HCT 48.0 43.9  MCV 92.8 94.0  PLT 160 147*   Basic Metabolic Panel: Recent Labs  Lab 04/20/23 0047 04/20/23 1931  NA 134* 139  K 4.2  3.4*  CL 101 111  CO2 22 21*  GLUCOSE 115* 89  BUN 23 24*  CREATININE 1.00 0.73  CALCIUM 9.6 7.1*   GFR: Estimated Creatinine Clearance: 101.4 mL/min (by C-G formula based on SCr of 0.73 mg/dL). Liver Function Tests: Recent Labs  Lab 04/20/23 0047  AST 25  ALT 20  ALKPHOS 37*  BILITOT 1.4*  PROT 6.9  ALBUMIN 4.0   No results for input(s): "LIPASE", "AMYLASE" in the last 168 hours. No results for input(s): "AMMONIA" in the last 168 hours. Coagulation Profile: No results for input(s): "INR", "PROTIME" in the last 168 hours. Cardiac Enzymes: No results for input(s): "CKTOTAL", "CKMB", "CKMBINDEX", "TROPONINI" in the last 168 hours. BNP (last 3 results) No results for input(s): "PROBNP" in the last 8760 hours. HbA1C: No results for input(s): "HGBA1C" in the last 72 hours. CBG: No results for input(s): "GLUCAP" in the last 168 hours. Lipid Profile: No results for input(s): "CHOL", "HDL", "LDLCALC", "TRIG", "CHOLHDL", "LDLDIRECT" in the last 72 hours. Thyroid Function Tests: No results for input(s): "TSH", "T4TOTAL", "FREET4", "T3FREE", "THYROIDAB" in the last 72 hours. Anemia Panel: No results  for input(s): "VITAMINB12", "FOLATE", "FERRITIN", "TIBC", "IRON", "RETICCTPCT" in the last 72 hours. Sepsis Labs: No results for input(s): "PROCALCITON", "LATICACIDVEN" in the last 168 hours.  No results found for this or any previous visit (from the past 240 hour(s)).       Radiology Studies: MR LUMBAR SPINE WO CONTRAST  Result Date: 04/20/2023 CLINICAL DATA:  Initial evaluation for acute lower back pain, myelopathy. EXAM: MRI LUMBAR SPINE WITHOUT CONTRAST TECHNIQUE: Multiplanar, multisequence MR imaging of the lumbar spine was performed. No intravenous contrast was administered. COMPARISON:  Prior MRI from 10/01/2015. FINDINGS: Segmentation: Standard. Lowest well-formed disc space labeled the L5-S1 level. Alignment: Mild levoscoliosis. 3 mm degenerative retrolisthesis of L1 on  L2. Chronic 3 mm anterolisthesis of L3 on L4 and L4 on L5. Vertebrae: Sequelae of prior PLIF at L3-4 and L4-5. Vertebral body height maintained without acute or chronic fracture. Bone marrow signal intensity within normal limits. No worrisome osseous lesions. Mild reactive endplate changes present about the L1-2 interspace. No other abnormal marrow edema. Conus medullaris and cauda equina: Conus extends to the L1 level. Conus within normal limits. Proximal nerve roots of the cauda equina are irregular due to compressive stenosis at the L1-2 level. Paraspinal and other soft tissues: Postoperative changes noted within the lower posterior paraspinous soft tissues. No collections. Few small simple left renal cyst noted, largest of which measures 1.7 cm. These are benign in appearance, with no follow-up imaging recommended. Disc levels: T12-L1: Degenerative intervertebral disc space narrowing with disc desiccation and diffuse disc bulge. Associated reactive endplate spurring. Mild right greater left facet hypertrophy. No significant spinal stenosis. Foramina remain patent. L1-2: Retrolisthesis with degenerative intervertebral disc space narrowing. Diffuse disc bulge with disc desiccation and mild reactive endplate spurring. Superimposed right foraminal to subarticular disc extrusion with both superior and inferior migration (series 6, images 8, 7). Extruded disc material involves the right lateral epidural space, flattening the right aspect of the thecal sac. Superimposed moderate facet hypertrophy. Resultant moderate to severe canal with right greater than left lateral recess stenosis. Severe right with moderate left L1 foraminal narrowing. L2-3: Mild disc bulge with reactive endplate spurring. Moderate left worse than right facet hypertrophy. Resultant mild-to-moderate spinal stenosis. Mild bilateral L2 foraminal narrowing. L3-4: Prior PLIF. No significant residual spinal stenosis. No more than mild bilateral foraminal  narrowing. L4-5: Prior PLIF. Residual right eccentric disc bulge with endplate spurring and facet hypertrophy with residual mild right lateral recess stenosis. Central canal remains patent. Moderate right with mild left foraminal stenosis. L5-S1: Disc desiccation with diffuse disc bulge and reactive endplate spurring. Superimposed small central disc protrusion minimally indents the ventral thecal sac. Mild facet hypertrophy. Mild epidural lipomatosis. No significant spinal stenosis. Foramina appear patent. IMPRESSION: 1. Multifactorial degenerative changes including a moderate-sized right foraminal to subarticular disc extrusion at L1-2. Resultant moderate to severe canal with right greater than left lateral recess stenosis, with severe right and moderate left L1 foraminal narrowing. 2. Disc bulge with facet hypertrophy at L2-3 with resultant mild to moderate spinal stenosis, with mild bilateral L2 foraminal narrowing. 3. Prior PLIF at L3-4 and L4-5. Residual mild right lateral recess stenosis at L4-5 with moderate right foraminal narrowing. Electronically Signed   By: Rise Mu M.D.   On: 04/20/2023 22:47        Scheduled Meds:  carvedilol  12.5 mg Oral BID WC   docusate sodium  100 mg Oral BID   gabapentin  300 mg Oral BID   gabapentin  600 mg Oral QHS  heparin  5,000 Units Subcutaneous Q8H   irbesartan  300 mg Oral Daily   loratadine  10 mg Oral Daily   montelukast  10 mg Oral Daily   Continuous Infusions:   LOS: 0 days    Time spent: 52 minutes spent on chart review, discussion with nursing staff, consultants, updating family and interview/physical exam; more than 50% of that time was spent in counseling and/or coordination of care.    Alvira Philips [place], DO [facility] Available via Epic secure chat 7am-7pm After these hours, please refer to coverage provider listed on [email or web address] 04/21/2023, 10:16 AM

## 2023-04-21 NOTE — Progress Notes (Signed)
RN called [facility] for transport to [facility].

## 2023-04-21 NOTE — ED Provider Notes (Signed)
Clinical Course as of 04/21/23 0247  Mon Apr 20, 2023  2315 Patient's MRI shows multifactorial degenerative changes and a moderate-sized right foraminal subarticular disc extrusion at L1-L2 with resultant moderate to severe canal stenosis and writer's cramp and left lateral recess stenosis as well as severe right and moderate left L1 foraminal narrowing.  She also has disc bulge at L2-L3 [JK]  Tue Apr 21, 2023  0031 71 M with h/o L3-L5 fusion by [facility] orthopedics presenting with worsening lower back pain and radiculopathy. No new weakness. MRI without abscess, or cauda equina. Waiting on ortho call back and plan to admit for pain control. [VB]  0159 Spoke with Dr. Jerl Santos, no plans for surgery unless patient was having urinary incontinence which she has not been having.  Will consult inpatient.  Reaching out to hospitalist for admission for pain control. [VB]  0208 S/w Dr Imogene Burn who will evaluate patient for admission for pain control. Ortho to consult inpatient. [VB]    Clinical Course User Index [JK] Linwood Dibbles, MD [VB] Mardene Sayer, MD      Mardene Sayer, MD 04/21/23 [phone]

## 2023-04-21 NOTE — ED Notes (Signed)
ED TO INPATIENT HANDOFF REPORT  Name/Age/Gender Lee Mclaughlin 72 y.o. male  Code Status    Code Status Orders  (From admission, onward)           Start     Ordered   04/21/23 0415  Full code  Continuous       Question:  By:  Answer:  Consent: discussion documented in EHR   04/21/23 0415           Code Status History     Date Active Date Inactive Code Status Order ID Comments User Context   04/21/2023 0350 04/21/2023 0415 Full Code [id number]  Carollee Herter, DO ED   11/27/2021 1845 11/28/2021 1609 Full Code [id number]  Georga Bora, PA-C Inpatient   03/15/2020 1503 03/16/2020 1847 Full Code [id number]  Shelly Coss Inpatient   05/24/2019 1525 05/25/2019 1336 Full Code [id number]  Teryl Lucy, MD Inpatient   11/02/2018 1725 11/03/2018 1513 Full Code [id number]  Marinus Maw, MD Inpatient   10/01/2018 1122 10/01/2018 2036 Full Code [id number]  Marinus Maw, MD Inpatient   07/23/2015 1543 07/25/2015 1551 Full Code [id number]  Julien Girt, PA-C Inpatient       Home/SNF/Other Home  Chief Complaint Lumbar spinal stenosis [M48.061]  Level of Care/Admitting Diagnosis ED Disposition     ED Disposition  Admit   Condition  --   Comment  Hospital Area: [facility] [[id number]]  Level of Care: Med-Surg [16]  May place patient in observation at [facility] or [facility] if equivalent level of care is available:: No  Covid Evaluation: Asymptomatic - no recent exposure (last 10 days) testing not required  Diagnosis: Lumbar spinal stenosis [756433]  Admitting Physician: Imogene Burn, ERIC [[id number]]  Attending Physician: Imogene Burn, ERIC [[id number]]          Medical History Past Medical History:  Diagnosis Date   Coronary artery disease    COVID-19    In december 2020   Dyslipidemia    Dysrhythmia    a flutter   GERD (gastroesophageal reflux disease)    H/O cardiovascular stress test 10/19/2012   MET test - good effort, peak VO2 of almost 112%  predicted, HR 79% predicted, mildly ischemia at end of exercise   History of hyperthyroidism as a child    History of nuclear stress test 03/2011   bruce myoview; mild perfusion defect in mid anteroseptal, apical septal, apical regions (infarct/scar/breast attenuation); post-stress EF 55%; no signficant ischemia   Ischemic cardiomyopathy    history of    Myocardial infarction (HCC)    Pneumonia    as a kid   PONV (postoperative nausea and vomiting)    Primary localized osteoarthritis of right hip 05/24/2019   Primary localized osteoarthritis of right knee    Restless leg    right total hip arthroplasty with dislocation of hip (HCC) 08/14/2019   S/P CABG (coronary artery bypass graft) 2000   Staphylococcal arthritis of right knee (HCC) 2004   Wears glasses     Allergies Allergies  Allergen Reactions   Morphine And Related Nausea And Vomiting   Crestor [Rosuvastatin] Other (See Comments)    Myalgias    Vytorin [Ezetimibe-Simvastatin] Other (See Comments)    Myalgias     IV Location/Drains/Wounds Patient Lines/Drains/Airways Status     Active Line/Drains/Airways     Name Placement date Placement time Site Days   Peripheral IV 04/20/23 18 G Anterior;Right Antecubital 04/20/23  1822  Antecubital  1   Wound  Laceration Arm Right laceration sutured by EDPA dressing applied --  --  Arm  --            Labs/Imaging Results for orders placed or performed during the hospital encounter of 04/20/23 (from the past 48 hour(s))  CBC     Status: Abnormal   Collection Time: 04/20/23  7:31 PM  Result Value Ref Range   WBC 7.7 4.0 - 10.5 K/uL   RBC 4.67 4.22 - 5.81 MIL/uL   Hemoglobin 15.0 13.0 - 17.0 g/dL   HCT 16.1 09.6 - 04.5 %   MCV 94.0 80.0 - 100.0 fL   MCH 32.1 26.0 - 34.0 pg   MCHC 34.2 30.0 - 36.0 g/dL   RDW 40.9 81.1 - 91.4 %   Platelets 147 (L) 150 - 400 K/uL   nRBC 0.0 0.0 - 0.2 %    Comment: Performed at [facility], [address],  [place], [place] [address]  Basic metabolic panel     Status: Abnormal   Collection Time: 04/20/23  7:31 PM  Result Value Ref Range   Sodium 139 135 - 145 mmol/L   Potassium 3.4 (L) 3.5 - 5.1 mmol/L   Chloride 111 98 - 111 mmol/L   CO2 21 (L) 22 - 32 mmol/L   Glucose, Bld 89 70 - 99 mg/dL    Comment: Glucose reference range applies only to samples taken after fasting for at least 8 hours.   BUN 24 (H) 8 - 23 mg/dL   Creatinine, Ser 6.21 0.61 - 1.24 mg/dL   Calcium 7.1 (L) 8.9 - 10.3 mg/dL    Comment: DELTA CHECK NOTED   GFR, Estimated >60 >60 mL/min    Comment: (NOTE) Calculated using the CKD-EPI Creatinine Equation (2021)    Anion gap 7 5 - 15    Comment: Performed at [facility], [address], [place], [place] [address]   MR LUMBAR SPINE WO CONTRAST  Result Date: 04/20/2023 CLINICAL DATA:  Initial evaluation for acute lower back pain, myelopathy. EXAM: MRI LUMBAR SPINE WITHOUT CONTRAST TECHNIQUE: Multiplanar, multisequence MR imaging of the lumbar spine was performed. No intravenous contrast was administered. COMPARISON:  Prior MRI from 10/01/2015. FINDINGS: Segmentation: Standard. Lowest well-formed disc space labeled the L5-S1 level. Alignment: Mild levoscoliosis. 3 mm degenerative retrolisthesis of L1 on L2. Chronic 3 mm anterolisthesis of L3 on L4 and L4 on L5. Vertebrae: Sequelae of prior PLIF at L3-4 and L4-5. Vertebral body height maintained without acute or chronic fracture. Bone marrow signal intensity within normal limits. No worrisome osseous lesions. Mild reactive endplate changes present about the L1-2 interspace. No other abnormal marrow edema. Conus medullaris and cauda equina: Conus extends to the L1 level. Conus within normal limits. Proximal nerve roots of the cauda equina are irregular due to compressive stenosis at the L1-2 level. Paraspinal and other soft tissues: Postoperative changes noted within the lower posterior paraspinous soft tissues. No  collections. Few small simple left renal cyst noted, largest of which measures 1.7 cm. These are benign in appearance, with no follow-up imaging recommended. Disc levels: T12-L1: Degenerative intervertebral disc space narrowing with disc desiccation and diffuse disc bulge. Associated reactive endplate spurring. Mild right greater left facet hypertrophy. No significant spinal stenosis. Foramina remain patent. L1-2: Retrolisthesis with degenerative intervertebral disc space narrowing. Diffuse disc bulge with disc desiccation and mild reactive endplate spurring. Superimposed right foraminal to subarticular disc extrusion with both superior and inferior migration (series 6, images 8, 7). Extruded disc  material involves the right lateral epidural space, flattening the right aspect of the thecal sac. Superimposed moderate facet hypertrophy. Resultant moderate to severe canal with right greater than left lateral recess stenosis. Severe right with moderate left L1 foraminal narrowing. L2-3: Mild disc bulge with reactive endplate spurring. Moderate left worse than right facet hypertrophy. Resultant mild-to-moderate spinal stenosis. Mild bilateral L2 foraminal narrowing. L3-4: Prior PLIF. No significant residual spinal stenosis. No more than mild bilateral foraminal narrowing. L4-5: Prior PLIF. Residual right eccentric disc bulge with endplate spurring and facet hypertrophy with residual mild right lateral recess stenosis. Central canal remains patent. Moderate right with mild left foraminal stenosis. L5-S1: Disc desiccation with diffuse disc bulge and reactive endplate spurring. Superimposed small central disc protrusion minimally indents the ventral thecal sac. Mild facet hypertrophy. Mild epidural lipomatosis. No significant spinal stenosis. Foramina appear patent. IMPRESSION: 1. Multifactorial degenerative changes including a moderate-sized right foraminal to subarticular disc extrusion at L1-2. Resultant moderate to  severe canal with right greater than left lateral recess stenosis, with severe right and moderate left L1 foraminal narrowing. 2. Disc bulge with facet hypertrophy at L2-3 with resultant mild to moderate spinal stenosis, with mild bilateral L2 foraminal narrowing. 3. Prior PLIF at L3-4 and L4-5. Residual mild right lateral recess stenosis at L4-5 with moderate right foraminal narrowing. Electronically Signed   By: Rise Mu M.D.   On: 04/20/2023 22:47    Pending Labs Unresulted Labs (From admission, onward)     Start     Ordered   Signed and Held  HIV Antibody (routine testing w rflx)  (HIV Antibody (Routine testing w reflex) panel)  Add-on,   R        Signed and Held            Vitals/Pain Today's Vitals   04/21/23 0300 04/21/23 0330 04/21/23 0415 04/21/23 0418  BP: 127/71 (!) 148/88 109/82   Pulse:  64 (!) 55   Resp:  16    Temp:    98.2 F (36.8 C)  TempSrc:    Oral  SpO2:  97% 93%   Weight:      Height:      PainSc:        Isolation Precautions No active isolations  Medications Medications  HYDROmorphone (DILAUDID) injection 1 mg (1 mg Intravenous Given 04/20/23 1929)  LORazepam (ATIVAN) tablet 1 mg (1 mg Oral Given 04/20/23 2132)  HYDROmorphone (DILAUDID) injection 1 mg (1 mg Intravenous Given 04/21/23 0008)  dexamethasone (DECADRON) injection 10 mg (10 mg Intravenous Given 04/21/23 0009)    Mobility walks

## 2023-04-21 NOTE — Progress Notes (Signed)
Report called to [employer], LPN at [facility] 5N bed 40J Roney Jaffe has no additional questions at this time and will call RN if any questions arise.

## 2023-04-21 NOTE — Consult Note (Signed)
Reason for Consult:Right leg pain Referring Physician: Dr. Fidela Mclaughlin is an 72 y.o. male.  HPI: Patient was admitted to medicine service for severe right leg pain. Patient states he has been having left leg pain for months, but right leg pain began 1 weeks ago and has been debilitating. He is currently unable to walk. Pain is severe, and involves the back and right groin and thigh. Right leg pain is more manageable. He has been getting dilaudid for his pain, which does help.   Past Medical History:  Diagnosis Date   Coronary artery disease    COVID-19    In december 2020   Dyslipidemia    Dysrhythmia    a flutter   GERD (gastroesophageal reflux disease)    H/O cardiovascular stress test 10/19/2012   MET test - good effort, peak VO2 of almost 112% predicted, HR 79% predicted, mildly ischemia at end of exercise   History of hyperthyroidism as a child    History of nuclear stress test 03/2011   bruce myoview; mild perfusion defect in mid anteroseptal, apical septal, apical regions (infarct/scar/breast attenuation); post-stress EF 55%; no signficant ischemia   Ischemic cardiomyopathy    history of    Myocardial infarction (HCC)    Pneumonia    as a kid   PONV (postoperative nausea and vomiting)    Primary localized osteoarthritis of right hip 05/24/2019   Primary localized osteoarthritis of right knee    Restless leg    right total hip arthroplasty with dislocation of hip (HCC) 08/14/2019   S/P CABG (coronary artery bypass graft) 2000   Staphylococcal arthritis of right knee (HCC) 2004   Wears glasses     Past Surgical History:  Procedure Laterality Date   A-FLUTTER ABLATION N/A 10/01/2018   Procedure: A-FLUTTER ABLATION;  Surgeon: Marinus Maw, MD;  Location: [facility];  Service: Cardiovascular;  Laterality: N/A;   A-FLUTTER ABLATION N/A 11/02/2018   Procedure: A-FLUTTER ABLATION;  Surgeon: Marinus Maw, MD;  Location: [facility];  Service:  Cardiovascular;  Laterality: N/A;   CARDIAC CATHETERIZATION  06/01/2007   no obstructive CAD, patent grafts, EF 40%, apical hypokinesis, anterolateral hypokinesis (Dr. Laurell Josephs)    CERVICAL SPINE SURGERY     CHOLECYSTECTOMY     COLONOSCOPY     CORONARY ARTERY BYPASS GRAFT  06/05/1999   LIMA to LAD, SVG to diagonal 1, SVG to PDA of Cfx, right radial graft to OM1 (Dr. Molinda Bailiff)    ESOPHAGOGASTRODUODENOSCOPY     EYE SURGERY     KNEE SURGERY Right    pt. states x 6    TOTAL HIP ARTHROPLASTY Right 05/24/2019   Procedure: TOTAL HIP ARTHROPLASTY;  Surgeon: Teryl Lucy, MD;  Location: [facility];  Service: Orthopedics;  Laterality: Right;   TOTAL HIP REVISION Right 03/15/2020   Procedure: TOTAL HIP REVISION, posterior approach;  Surgeon: Durene Romans, MD;  Location: [facility];  Service: Orthopedics;  Laterality: Right;  90 mins posterior approach   TOTAL KNEE ARTHROPLASTY Right 07/23/2015   Procedure: RIGHT TOTAL KNEE ARTHROPLASTY;  Surgeon: Salvatore Marvel, MD;  Location: [facility] OR;  Service: Orthopedics;  Laterality: Right;   TRANSFORAMINAL LUMBAR INTERBODY FUSION (TLIF) WITH PEDICLE SCREW FIXATION 2 LEVEL Left 11/27/2021   Procedure: LEFT-SIDED LUMBAR 3- LUMBAR 4, LUMBAR 4 - LUMBAR 5 TRANSFORAMINAL LUMBAR INTERBODY FUSION WITH INSTRUMENTATION AND ALLOGRAFT;  Surgeon: Estill Bamberg, MD;  Location: [facility];  Service: Orthopedics;  Laterality: Left;   TRANSTHORACIC  ECHOCARDIOGRAM  02/2008   EF 40%, severe apical wall hypokinesis, mod-severe anterior wall hypokinesis; RV mildly dilated; mild mitral annular calcif; mild TR, RSVP 30-21mmHg    Family History  Problem Relation Age of Onset   CAD Father        s/p CABGx4 at 68   Hyperlipidemia Father     Social History:  reports that he has been smoking cigars. He has never used smokeless tobacco. He reports current alcohol use. He reports that he does not use drugs.  Allergies:  Allergies  Allergen Reactions   Morphine And Related Nausea And Vomiting    Crestor [Rosuvastatin] Other (See Comments)    Myalgias    Vytorin [Ezetimibe-Simvastatin] Other (See Comments)    Myalgias     Medications: I have reviewed the patient's current medications.  Results for orders placed or performed during the hospital encounter of 04/20/23 (from the past 48 hour(s))  CBC     Status: Abnormal   Collection Time: 04/20/23  7:31 PM  Result Value Ref Range   WBC 7.7 4.0 - 10.5 K/uL   RBC 4.67 4.22 - 5.81 MIL/uL   Hemoglobin 15.0 13.0 - 17.0 g/dL   HCT 09.8 11.9 - 14.7 %   MCV 94.0 80.0 - 100.0 fL   MCH 32.1 26.0 - 34.0 pg   MCHC 34.2 30.0 - 36.0 g/dL   RDW 82.9 56.2 - 13.0 %   Platelets 147 (L) 150 - 400 K/uL   nRBC 0.0 0.0 - 0.2 %    Comment: Performed at [facility], [address] [place], [place] [address]  Basic metabolic panel     Status: Abnormal   Collection Time: 04/20/23  7:31 PM  Result Value Ref Range   Sodium 139 135 - 145 mmol/L   Potassium 3.4 (L) 3.5 - 5.1 mmol/L   Chloride 111 98 - 111 mmol/L   CO2 21 (L) 22 - 32 mmol/L   Glucose, Bld 89 70 - 99 mg/dL    Comment: Glucose reference range applies only to samples taken after fasting for at least 8 hours.   BUN 24 (H) 8 - 23 mg/dL   Creatinine, Ser 4.69 0.61 - 1.24 mg/dL   Calcium 7.1 (L) 8.9 - 10.3 mg/dL    Comment: DELTA CHECK NOTED   GFR, Estimated >60 >60 mL/min    Comment: (NOTE) Calculated using the CKD-EPI Creatinine Equation (2021)    Anion gap 7 5 - 15    Comment: Performed at [facility], [address] [place], [place] [address]    MR LUMBAR SPINE WO CONTRAST  Result Date: 04/20/2023 CLINICAL DATA:  Initial evaluation for acute lower back pain, myelopathy. EXAM: MRI LUMBAR SPINE WITHOUT CONTRAST TECHNIQUE: Multiplanar, multisequence MR imaging of the lumbar spine was performed. No intravenous contrast was administered. COMPARISON:  Prior MRI from 10/01/2015. FINDINGS: Segmentation: Standard. Lowest well-formed disc space  labeled the L5-S1 level. Alignment: Mild levoscoliosis. 3 mm degenerative retrolisthesis of L1 on L2. Chronic 3 mm anterolisthesis of L3 on L4 and L4 on L5. Vertebrae: Sequelae of prior PLIF at L3-4 and L4-5. Vertebral body height maintained without acute or chronic fracture. Bone marrow signal intensity within normal limits. No worrisome osseous lesions. Mild reactive endplate changes present about the L1-2 interspace. No other abnormal marrow edema. Conus medullaris and cauda equina: Conus extends to the L1 level. Conus within normal limits. Proximal nerve roots of the cauda equina are irregular due to compressive stenosis at the  L1-2 level. Paraspinal and other soft tissues: Postoperative changes noted within the lower posterior paraspinous soft tissues. No collections. Few small simple left renal cyst noted, largest of which measures 1.7 cm. These are benign in appearance, with no follow-up imaging recommended. Disc levels: T12-L1: Degenerative intervertebral disc space narrowing with disc desiccation and diffuse disc bulge. Associated reactive endplate spurring. Mild right greater left facet hypertrophy. No significant spinal stenosis. Foramina remain patent. L1-2: Retrolisthesis with degenerative intervertebral disc space narrowing. Diffuse disc bulge with disc desiccation and mild reactive endplate spurring. Superimposed right foraminal to subarticular disc extrusion with both superior and inferior migration (series 6, images 8, 7). Extruded disc material involves the right lateral epidural space, flattening the right aspect of the thecal sac. Superimposed moderate facet hypertrophy. Resultant moderate to severe canal with right greater than left lateral recess stenosis. Severe right with moderate left L1 foraminal narrowing. L2-3: Mild disc bulge with reactive endplate spurring. Moderate left worse than right facet hypertrophy. Resultant mild-to-moderate spinal stenosis. Mild bilateral L2 foraminal  narrowing. L3-4: Prior PLIF. No significant residual spinal stenosis. No more than mild bilateral foraminal narrowing. L4-5: Prior PLIF. Residual right eccentric disc bulge with endplate spurring and facet hypertrophy with residual mild right lateral recess stenosis. Central canal remains patent. Moderate right with mild left foraminal stenosis. L5-S1: Disc desiccation with diffuse disc bulge and reactive endplate spurring. Superimposed small central disc protrusion minimally indents the ventral thecal sac. Mild facet hypertrophy. Mild epidural lipomatosis. No significant spinal stenosis. Foramina appear patent. IMPRESSION: 1. Multifactorial degenerative changes including a moderate-sized right foraminal to subarticular disc extrusion at L1-2. Resultant moderate to severe canal with right greater than left lateral recess stenosis, with severe right and moderate left L1 foraminal narrowing. 2. Disc bulge with facet hypertrophy at L2-3 with resultant mild to moderate spinal stenosis, with mild bilateral L2 foraminal narrowing. 3. Prior PLIF at L3-4 and L4-5. Residual mild right lateral recess stenosis at L4-5 with moderate right foraminal narrowing. Electronically Signed   By: Rise Mu M.D.   On: 04/20/2023 22:47    Review of Systems  Constitutional: Negative.  Negative for fatigue.  Eyes: Negative.   Respiratory: Negative.    Gastrointestinal: Negative.   Endocrine: Negative.   Neurological: Negative.   Psychiatric/Behavioral: Negative.     Blood pressure (!) 148/86, pulse (!) 59, temperature 97.7 F (36.5 C), temperature source Oral, resp. rate 17, height 5\' 8"  (1.727 m), weight 106 kg, SpO2 99 %. Physical Exam Constitutional:      Appearance: He is normal weight.  HENT:     Head: Normocephalic.     Nose: Nose normal.     Mouth/Throat:     Mouth: Mucous membranes are dry.  Eyes:     Pupils: Pupils are equal, round, and reactive to light.  Pulmonary:     Effort: Pulmonary effort is  normal.  Abdominal:     Palpations: Abdomen is soft.  Musculoskeletal:     Cervical back: Normal range of motion and neck supple.  Skin:    General: Skin is warm and dry.     Capillary Refill: Capillary refill takes less than 2 seconds.  Neurological:     General: No focal deficit present.     Mental Status: He is alert.     Comments: Patient noted to have 4/5 strength to hip flexion, which does reproduce pain in the back and right leg  Psychiatric:        Mood and Affect: Mood normal.  Behavior: Behavior normal.     Assessment/Plan: Patient noted to have severe back pain and right leg pain with an MRI from yesterday showing an acute right L1/12 disc herniation, involving the right lateral recess and foraminal regions. This is certainly the cause for the patient severe, debilitating pain and weakness. We did discuss nonoperative and operatvie interventions, and he does wish to proceed with surgery, specifically, an L1/2 decompression, and given the instability that would certainly result, a right-sided L1/2 transforaminal lumbar interbody fusion with instrumentation and allograft would also be indicated. The patient feels he can not live with his current pain and weakness. I did discuss the recovery period and risks associated with surgery (as previously documented in my prior office note from patient previous L4-S1 surgery.   Patient will be transferred to [facility] for surgery anticipated to be performed on 04/23/2023. Will reach out to medicine team to arrange. Please ensure patient is made NPO after midnight on 04/22/2023.  Lee Mclaughlin 04/21/2023, 9:10 AM

## 2023-04-21 NOTE — Assessment & Plan Note (Signed)
Has statin intolerance. On SQ Repatha.

## 2023-04-21 NOTE — Plan of Care (Signed)

## 2023-04-21 NOTE — Progress Notes (Signed)
Patient arrives unit at 4:50 am. Vital Signs taken. Alert and Oriented by 4. States some pain. Report received from ED Nurse: Rebbeca Paul, RN.

## 2023-04-21 NOTE — H&P (Signed)
History and Physical    Lee Mclaughlin:[id number] DOB: [birth year] DOA: 04/20/2023  DOS: the patient was seen and examined on 04/20/2023  PCP: Barbie Banner, MD   Patient coming from: Home  I have personally briefly reviewed patient's old medical records in [facility]  CC: back pain HPI: 72 year old Caucasian male history of coronary disease status post CABG, hypertension, hyperlipidemia that is statin intolerant, history of a flutter status post ablation off anticoagulation, obesity BMI of 35.5 who presents to the ER today with continued back pain.  He was seen yesterday in the ER was treated for lumbar back pain.  He was given a prescription for oxycodone.  He went home and took oxycodone and it did not help.  He presents back to the ER for continued back pain.  He states that he has been seeing orthopedics for his back pain for several years.  He had severe spinal stenosis at the L3-4 L4-5 level in December 2022.  He had up getting a lumbar decompression at the L3-4 L4-5 level.  Over the last month, he has been getting injections in his lower back on the left side due to left-sided leg pain and radicular pain.  He began having increasing pain on the right side about 2 weeks ago.  He was seen in the orthopedic office last week.  They were trying to set up a lumbar injection for him this next week but still had to have insurance authorization.  Patient states the pain is so severe he cannot wait.  He came to the ER for pain control.  He states that as long as he is laying flat on his back, he does not have much pain but as soon as he tries to sit up or is standing, he has severe pain in the right side of his low back.  He has received multiple doses of IV pain medicine along with a dose of IV Decadron.  EDP is discussed the case with orthopedics Dr. Jerl Santos who will see the patient in consult.  [facility] contacted for admission.   ED Course: MRI lumbar spine shows spinal  stenosis at L1-2 level.   Review of Systems:  Review of Systems  Constitutional: Negative.   HENT: Negative.    Eyes: Negative.   Respiratory: Negative.    Cardiovascular: Negative.   Gastrointestinal:  Negative for constipation and diarrhea.  Genitourinary:  Negative for dysuria, frequency, hematuria and urgency.  Musculoskeletal:  Positive for back pain.  Skin: Negative.   Neurological: Negative.   Endo/Heme/Allergies: Negative.   Psychiatric/Behavioral: Negative.    All other systems reviewed and are negative.   Past Medical History:  Diagnosis Date   Coronary artery disease    COVID-19    In december 2020   Dyslipidemia    Dysrhythmia    a flutter   GERD (gastroesophageal reflux disease)    H/O cardiovascular stress test 10/19/2012   MET test - good effort, peak VO2 of almost 112% predicted, HR 79% predicted, mildly ischemia at end of exercise   History of hyperthyroidism as a child    History of nuclear stress test 03/2011   bruce myoview; mild perfusion defect in mid anteroseptal, apical septal, apical regions (infarct/scar/breast attenuation); post-stress EF 55%; no signficant ischemia   Ischemic cardiomyopathy    history of    Myocardial infarction (HCC)    Pneumonia    as a kid   PONV (postoperative nausea and vomiting)    Primary localized  osteoarthritis of right hip 05/24/2019   Primary localized osteoarthritis of right knee    Restless leg    right total hip arthroplasty with dislocation of hip (HCC) 08/14/2019   S/P CABG (coronary artery bypass graft) 2000   Staphylococcal arthritis of right knee (HCC) 2004   Wears glasses     Past Surgical History:  Procedure Laterality Date   A-FLUTTER ABLATION N/A 10/01/2018   Procedure: A-FLUTTER ABLATION;  Surgeon: Marinus Maw, MD;  Location: [facility];  Service: Cardiovascular;  Laterality: N/A;   A-FLUTTER ABLATION N/A 11/02/2018   Procedure: A-FLUTTER ABLATION;  Surgeon: Marinus Maw, MD;   Location: [facility];  Service: Cardiovascular;  Laterality: N/A;   CARDIAC CATHETERIZATION  06/01/2007   no obstructive CAD, patent grafts, EF 40%, apical hypokinesis, anterolateral hypokinesis (Dr. Laurell Josephs)    CERVICAL SPINE SURGERY     CHOLECYSTECTOMY     COLONOSCOPY     CORONARY ARTERY BYPASS GRAFT  06/05/1999   LIMA to LAD, SVG to diagonal 1, SVG to PDA of Cfx, right radial graft to OM1 (Dr. Molinda Bailiff)    ESOPHAGOGASTRODUODENOSCOPY     EYE SURGERY     KNEE SURGERY Right    pt. states x 6    TOTAL HIP ARTHROPLASTY Right 05/24/2019   Procedure: TOTAL HIP ARTHROPLASTY;  Surgeon: Teryl Lucy, MD;  Location: [facility];  Service: Orthopedics;  Laterality: Right;   TOTAL HIP REVISION Right 03/15/2020   Procedure: TOTAL HIP REVISION, posterior approach;  Surgeon: Durene Romans, MD;  Location: [facility];  Service: Orthopedics;  Laterality: Right;  90 mins posterior approach   TOTAL KNEE ARTHROPLASTY Right 07/23/2015   Procedure: RIGHT TOTAL KNEE ARTHROPLASTY;  Surgeon: Salvatore Marvel, MD;  Location: [facility] OR;  Service: Orthopedics;  Laterality: Right;   TRANSFORAMINAL LUMBAR INTERBODY FUSION (TLIF) WITH PEDICLE SCREW FIXATION 2 LEVEL Left 11/27/2021   Procedure: LEFT-SIDED LUMBAR 3- LUMBAR 4, LUMBAR 4 - LUMBAR 5 TRANSFORAMINAL LUMBAR INTERBODY FUSION WITH INSTRUMENTATION AND ALLOGRAFT;  Surgeon: Estill Bamberg, MD;  Location: [facility] OR;  Service: Orthopedics;  Laterality: Left;   TRANSTHORACIC ECHOCARDIOGRAM  02/2008   EF 40%, severe apical wall hypokinesis, mod-severe anterior wall hypokinesis; RV mildly dilated; mild mitral annular calcif; mild TR, RSVP 30-71mmHg     reports that he has been smoking cigars. He has never used smokeless tobacco. He reports current alcohol use. He reports that he does not use drugs.  Allergies  Allergen Reactions   Morphine And Related Nausea And Vomiting   Crestor [Rosuvastatin] Other (See Comments)    Myalgias    Vytorin [Ezetimibe-Simvastatin] Other (See  Comments)    Myalgias     Family History  Problem Relation Age of Onset   CAD Father        s/p CABGx4 at 18   Hyperlipidemia Father     Prior to Admission medications   Medication Sig Start Date End Date Taking? Authorizing Provider  albuterol (VENTOLIN HFA) 108 (90 Base) MCG/ACT inhaler Inhale 2 puffs into the lungs every 6 (six) hours as needed for wheezing or shortness of breath.   Yes [provider]  carvedilol (COREG) 12.5 MG tablet Take 12.5 mg by mouth 2 (two) times daily with a meal.   Yes [provider]  DULoxetine (CYMBALTA) 30 MG capsule Take 30 mg by mouth 2 (two) times daily. 04/17/23  Yes [provider]  gabapentin (NEURONTIN) 300 MG capsule Take 300 mg by mouth 2 (two) times daily.  04/16/23  Yes [provider]  gabapentin (NEURONTIN) 600 MG tablet TAKE 1 TABLET BY MOUTH AT BEDTIME. 11/03/22  Yes Olalere, Adewale A, MD  loratadine (CLARITIN) 10 MG tablet Take 10 mg by mouth daily.   Yes [provider]  meloxicam (MOBIC) 15 MG tablet Take 15 mg by mouth daily. 02/05/23  Yes [provider]  montelukast (SINGULAIR) 10 MG tablet Take 10 mg by mouth in the morning. 12/29/22  Yes [provider]  olmesartan (BENICAR) 40 MG tablet Take 40 mg by mouth every morning.    Yes [provider]  oxyCODONE (ROXICODONE) 5 MG immediate release tablet Take 1 tablet (5 mg total) by mouth every 6 (six) hours as needed for severe pain. 04/20/23  Yes Tanda Rockers A, DO  REPATHA SURECLICK 140 MG/ML SOAJ INJECT 140 MG INTO THE SKIN EVERY 14 (FOURTEEN) DAYS. 05/30/22  Yes Hilty, Lisette Abu, MD  lidocaine (LIDODERM) 5 % Place 1 patch onto the skin daily as needed. Remove & Discard patch within 12 hours or as directed by MD 04/20/23   Sloan Leiter, DO  methocarbamol (ROBAXIN) 500 MG tablet Take 1 tablet (500 mg total) by mouth every 6 (six) hours as needed for muscle spasms. Patient not taking: Reported on 04/21/2023 11/28/21    Estill Bamberg, MD  oxyCODONE-acetaminophen (PERCOCET/ROXICET) 5-325 MG tablet Take 1-2 tablets by mouth every 4 (four) hours as needed for severe pain. Patient not taking: Reported on 04/21/2023 11/28/21   Estill Bamberg, MD    Physical Exam: Vitals:   04/21/23 0011 04/21/23 0145 04/21/23 0300 04/21/23 0330  BP:  122/83 127/71 (!) 148/88  Pulse:    64  Resp:    16  Temp: 97.6 F (36.4 C)     TempSrc: Oral     SpO2:    97%  Weight:      Height:        Physical Exam Vitals and nursing note reviewed.  Constitutional:      General: He is not in acute distress.    Appearance: He is obese. He is not ill-appearing, toxic-appearing or diaphoretic.  HENT:     Head: Normocephalic and atraumatic.     Nose: Nose normal.  Cardiovascular:     Rate and Rhythm: Normal rate and regular rhythm.     Pulses: Normal pulses.  Pulmonary:     Effort: Pulmonary effort is normal. No respiratory distress.     Breath sounds: Normal breath sounds.  Abdominal:     General: Bowel sounds are normal. There is no distension.     Palpations: Abdomen is soft.     Tenderness: There is no abdominal tenderness. There is no guarding or rebound.  Musculoskeletal:     Right lower leg: No edema.     Left lower leg: No edema.     Comments: +5/5 bilateral hip flexor strength  Skin:    General: Skin is warm and dry.     Capillary Refill: Capillary refill takes less than 2 seconds.  Neurological:     General: No focal deficit present.     Mental Status: He is alert and oriented to person, place, and time.      Labs on Admission: I have personally reviewed following labs and imaging studies  CBC: Recent Labs  Lab 04/20/23 0047 04/20/23 1931  WBC 7.6 7.7  NEUTROABS 4.2  --   HGB 16.5 15.0  HCT 48.0 43.9  MCV 92.8 94.0  PLT 160 147*  Basic Metabolic Panel: Recent Labs  Lab 04/20/23 0047 04/20/23 1931  NA 134* 139  K 4.2 3.4*  CL 101 111  CO2 22 21*  GLUCOSE 115* 89  BUN 23 24*  CREATININE  1.00 0.73  CALCIUM 9.6 7.1*   GFR: Estimated Creatinine Clearance: 101.4 mL/min (by C-G formula based on SCr of 0.73 mg/dL). Liver Function Tests: Recent Labs  Lab 04/20/23 0047  AST 25  ALT 20  ALKPHOS 37*  BILITOT 1.4*  PROT 6.9  ALBUMIN 4.0   Radiological Exams on Admission: I have personally reviewed images MR LUMBAR SPINE WO CONTRAST  Result Date: 04/20/2023 CLINICAL DATA:  Initial evaluation for acute lower back pain, myelopathy. EXAM: MRI LUMBAR SPINE WITHOUT CONTRAST TECHNIQUE: Multiplanar, multisequence MR imaging of the lumbar spine was performed. No intravenous contrast was administered. COMPARISON:  Prior MRI from 10/01/2015. FINDINGS: Segmentation: Standard. Lowest well-formed disc space labeled the L5-S1 level. Alignment: Mild levoscoliosis. 3 mm degenerative retrolisthesis of L1 on L2. Chronic 3 mm anterolisthesis of L3 on L4 and L4 on L5. Vertebrae: Sequelae of prior PLIF at L3-4 and L4-5. Vertebral body height maintained without acute or chronic fracture. Bone marrow signal intensity within normal limits. No worrisome osseous lesions. Mild reactive endplate changes present about the L1-2 interspace. No other abnormal marrow edema. Conus medullaris and cauda equina: Conus extends to the L1 level. Conus within normal limits. Proximal nerve roots of the cauda equina are irregular due to compressive stenosis at the L1-2 level. Paraspinal and other soft tissues: Postoperative changes noted within the lower posterior paraspinous soft tissues. No collections. Few small simple left renal cyst noted, largest of which measures 1.7 cm. These are benign in appearance, with no follow-up imaging recommended. Disc levels: T12-L1: Degenerative intervertebral disc space narrowing with disc desiccation and diffuse disc bulge. Associated reactive endplate spurring. Mild right greater left facet hypertrophy. No significant spinal stenosis. Foramina remain patent. L1-2: Retrolisthesis with  degenerative intervertebral disc space narrowing. Diffuse disc bulge with disc desiccation and mild reactive endplate spurring. Superimposed right foraminal to subarticular disc extrusion with both superior and inferior migration (series 6, images 8, 7). Extruded disc material involves the right lateral epidural space, flattening the right aspect of the thecal sac. Superimposed moderate facet hypertrophy. Resultant moderate to severe canal with right greater than left lateral recess stenosis. Severe right with moderate left L1 foraminal narrowing. L2-3: Mild disc bulge with reactive endplate spurring. Moderate left worse than right facet hypertrophy. Resultant mild-to-moderate spinal stenosis. Mild bilateral L2 foraminal narrowing. L3-4: Prior PLIF. No significant residual spinal stenosis. No more than mild bilateral foraminal narrowing. L4-5: Prior PLIF. Residual right eccentric disc bulge with endplate spurring and facet hypertrophy with residual mild right lateral recess stenosis. Central canal remains patent. Moderate right with mild left foraminal stenosis. L5-S1: Disc desiccation with diffuse disc bulge and reactive endplate spurring. Superimposed small central disc protrusion minimally indents the ventral thecal sac. Mild facet hypertrophy. Mild epidural lipomatosis. No significant spinal stenosis. Foramina appear patent. IMPRESSION: 1. Multifactorial degenerative changes including a moderate-sized right foraminal to subarticular disc extrusion at L1-2. Resultant moderate to severe canal with right greater than left lateral recess stenosis, with severe right and moderate left L1 foraminal narrowing. 2. Disc bulge with facet hypertrophy at L2-3 with resultant mild to moderate spinal stenosis, with mild bilateral L2 foraminal narrowing. 3. Prior PLIF at L3-4 and L4-5. Residual mild right lateral recess stenosis at L4-5 with moderate right foraminal narrowing. Electronically Signed   By: Sharlet Salina  Phill Myron  M.D.   On: 04/20/2023 22:47    EKG: My personal interpretation of EKG shows: no EKG to review  Assessment/Plan Principal Problem:   Lumbar spinal stenosis Active Problems:   CAD (coronary artery disease)   Dyslipidemia   Essential hypertension  Assessment and Plan: * Lumbar spinal stenosis Observation med/surg bed. Pt given IV decadron in the ER. Orthopedics consulted. Pt states he was trying to get spinal injections in the orthopedics office last week but has not gotten insurance authorization for it yet. Po oxycodone given to him yesterday with ER Rx are ineffective. Prn po dilaudid.  Essential hypertension On benicar and coreg.  Dyslipidemia Has statin intolerance. On SQ Repatha.  CAD (coronary artery disease) Stable.   DVT prophylaxis: SQ Heparin Code Status: Full Code Family Communication: no family at bedside  Disposition Plan: return home  Consults called: EDP has consulted Dr. Jerl Santos  Admission status: Observation, Med-Surg   Carollee Herter, DO [facility] 04/21/2023, 4:10 AM

## 2023-04-21 NOTE — Assessment & Plan Note (Addendum)
On benicar and coreg.

## 2023-04-21 NOTE — Assessment & Plan Note (Signed)
Stable

## 2023-04-22 DIAGNOSIS — F17290 Nicotine dependence, other tobacco product, uncomplicated: Secondary | ICD-10-CM | POA: Diagnosis present

## 2023-04-22 DIAGNOSIS — Z96651 Presence of right artificial knee joint: Secondary | ICD-10-CM | POA: Diagnosis present

## 2023-04-22 DIAGNOSIS — Z9049 Acquired absence of other specified parts of digestive tract: Secondary | ICD-10-CM | POA: Diagnosis not present

## 2023-04-22 DIAGNOSIS — M5116 Intervertebral disc disorders with radiculopathy, lumbar region: Secondary | ICD-10-CM | POA: Diagnosis present

## 2023-04-22 DIAGNOSIS — Z6835 Body mass index (BMI) 35.0-35.9, adult: Secondary | ICD-10-CM | POA: Diagnosis not present

## 2023-04-22 DIAGNOSIS — E785 Hyperlipidemia, unspecified: Secondary | ICD-10-CM | POA: Diagnosis present

## 2023-04-22 DIAGNOSIS — Z951 Presence of aortocoronary bypass graft: Secondary | ICD-10-CM | POA: Diagnosis not present

## 2023-04-22 DIAGNOSIS — Z885 Allergy status to narcotic agent status: Secondary | ICD-10-CM | POA: Diagnosis not present

## 2023-04-22 DIAGNOSIS — Z8249 Family history of ischemic heart disease and other diseases of the circulatory system: Secondary | ICD-10-CM | POA: Diagnosis not present

## 2023-04-22 DIAGNOSIS — M48061 Spinal stenosis, lumbar region without neurogenic claudication: Secondary | ICD-10-CM | POA: Diagnosis present

## 2023-04-22 DIAGNOSIS — Z96641 Presence of right artificial hip joint: Secondary | ICD-10-CM | POA: Diagnosis present

## 2023-04-22 DIAGNOSIS — Z791 Long term (current) use of non-steroidal anti-inflammatories (NSAID): Secondary | ICD-10-CM | POA: Diagnosis not present

## 2023-04-22 DIAGNOSIS — K219 Gastro-esophageal reflux disease without esophagitis: Secondary | ICD-10-CM | POA: Diagnosis present

## 2023-04-22 DIAGNOSIS — I2510 Atherosclerotic heart disease of native coronary artery without angina pectoris: Secondary | ICD-10-CM | POA: Diagnosis present

## 2023-04-22 DIAGNOSIS — M5416 Radiculopathy, lumbar region: Secondary | ICD-10-CM

## 2023-04-22 DIAGNOSIS — Z888 Allergy status to other drugs, medicaments and biological substances status: Secondary | ICD-10-CM | POA: Diagnosis not present

## 2023-04-22 DIAGNOSIS — G2581 Restless legs syndrome: Secondary | ICD-10-CM | POA: Diagnosis present

## 2023-04-22 DIAGNOSIS — I10 Essential (primary) hypertension: Secondary | ICD-10-CM | POA: Diagnosis present

## 2023-04-22 DIAGNOSIS — K5900 Constipation, unspecified: Secondary | ICD-10-CM | POA: Diagnosis not present

## 2023-04-22 DIAGNOSIS — M4726 Other spondylosis with radiculopathy, lumbar region: Secondary | ICD-10-CM | POA: Diagnosis present

## 2023-04-22 DIAGNOSIS — Z79899 Other long term (current) drug therapy: Secondary | ICD-10-CM | POA: Diagnosis not present

## 2023-04-22 DIAGNOSIS — Z83438 Family history of other disorder of lipoprotein metabolism and other lipidemia: Secondary | ICD-10-CM | POA: Diagnosis not present

## 2023-04-22 DIAGNOSIS — E669 Obesity, unspecified: Secondary | ICD-10-CM | POA: Diagnosis present

## 2023-04-22 DIAGNOSIS — Z8616 Personal history of COVID-19: Secondary | ICD-10-CM | POA: Diagnosis not present

## 2023-04-22 DIAGNOSIS — I252 Old myocardial infarction: Secondary | ICD-10-CM | POA: Diagnosis not present

## 2023-04-22 LAB — BASIC METABOLIC PANEL
Anion gap: 12 (ref 5–15)
BUN: 27 mg/dL — ABNORMAL HIGH (ref 8–23)
CO2: 24 mmol/L (ref 22–32)
Calcium: 9.4 mg/dL (ref 8.9–10.3)
Chloride: 102 mmol/L (ref 98–111)
Creatinine, Ser: 0.95 mg/dL (ref 0.61–1.24)
GFR, Estimated: >60 mL/min (ref >60)
Glucose, Bld: 95 mg/dL (ref 70–99)
Potassium: 3.6 mmol/L (ref 3.5–5.1)
Sodium: 138 mmol/L (ref 135–145)

## 2023-04-22 LAB — MAGNESIUM: Magnesium: 2.1 mg/dL (ref 1.7–2.4)

## 2023-04-22 MED ORDER — HYDROMORPHONE HCL 1 MG/ML IJ SOLN
1.0000 mg | INTRAMUSCULAR | Status: DC | PRN
  Administered 2023-04-22 – 2023-04-24 (×14): 1 mg via INTRAVENOUS
  Filled 2023-04-22 (×14): qty 1

## 2023-04-22 MED ORDER — HYDROMORPHONE HCL 2 MG PO TABS
2.0000 mg | ORAL_TABLET | ORAL | Status: DC
  Administered 2023-04-22 – 2023-04-23 (×7): 2 mg via ORAL
  Filled 2023-04-22 (×7): qty 1

## 2023-04-22 NOTE — Progress Notes (Signed)
[facility] Progress Note Patient: Lee Mclaughlin GNF:[id number] DOB: 04-20-1951 DOA: 04/20/2023  DOS: the patient was seen and examined on 04/22/2023  Brief hospital course: PMH of HTN, HLD, CAD SP CABG, atrial flutter SP ablation, obesity presented to hospital with complaints of back pain.  MRI lumbar spine showed L1-L2 disc extrusion with severe canal stenosis.  Orthopedic was consulted.  Dr. Yevette Edwards recommending L1/2 decompression and transforaminal lumbar interbody fusion.  Originally came to [facility] and transferred to [facility] for surgical intervention. Scheduled for surgery on 5/16. Assessment and Plan: Lumbar spinal stenosis with radiculopathy Patient presenting to ED with progressive low back pain x 2 weeks now with radiculopathy.  MR L-spine with multifactorial degenerative changes including a moderate-sized right foraminal to subarticular disc extrusion at L1-2, with resultant moderate to severe canal with right greater than left lateral recess stenosis with severe right and moderate left L1 foraminal narrowing, disc bulge with facet hypertrophy at L2-3 with resultant mid to moderate spinal stenosis, with mild bilateral L2 foraminal narrowing, noted prior PLIF at L3-4 and L4-5.  Orthopedics following, appreciate assistance Currently recommending surgical intervention.  Scheduled for surgery on 5/16. Weightbearing, DVT prophylaxis, Pain control per surgery. Patient does not appear to have any prohibitive risk for the surgery.  Has tolerated similar surgeries in the past. Currently on gabapentin, Dilaudid scheduled every 4 hours and as needed as well as oxycodone.  Essential hypertension Blood pressure stable. Continuing carvedilol and irbesartan.   CAD s/p CABG Does not have any complaints of angina. Not on aspirin or statin outpatient.  Was cleared by cardiology in November 2023 for colonoscopy.   HLD Not on medication outpatient, intolerant to statins    Obesity Body mass index is 32.72 kg/m.  Placing the patient at high risk for poor outcome.  Typical atrial flutter. SP ablation in 2019. Not on any anticoagulation. No further workup for now.  Subjective: Pain still present.  No nausea no vomiting no fever no chills.  Passing gas.  Physical Exam: General: in Mild distress, No Rash Cardiovascular: S1 and S2 Present, No Murmur Respiratory: Good respiratory effort, Bilateral Air entry present. No Crackles, No wheezes Abdomen: Bowel Sound present, No tenderness Extremities: No edema Neuro: Alert and oriented x3, no new focal deficit  Data Reviewed: I have Reviewed nursing notes, Vitals, and Lab results. Since last encounter, pertinent lab results CBC and BMP   . I have ordered test including CBC and BMP  .  Disposition: Status is: Inpatient Remains inpatient appropriate because: Need for pain control and surgery.  heparin injection 5,000 Units Start: 04/21/23 0600 SCDs Start: 04/21/23 0535   Family Communication: No one at bedside Level of care: Med-Surg   Vitals:   04/22/23 0009 04/22/23 0500 04/22/23 0801 04/22/23 1403  BP: 118/80  132/69 111/80  Pulse: 61  79 62  Resp: 17  18 18   Temp:   98.3 F (36.8 C) 98.3 F (36.8 C)  TempSrc:      SpO2: 95%  94% 93%  Weight:  97.6 kg    Height:         Author: Lynden Oxford, MD 04/22/2023 3:59 PM  Please look on [email or web address] to find out who is on call.

## 2023-04-22 NOTE — Hospital Course (Addendum)
PMH of HTN, HLD, CAD SP CABG, atrial flutter SP ablation, obesity presented to hospital with complaints of back pain.  MRI lumbar spine showed L1-L2 disc extrusion with severe canal stenosis.  Orthopedic was consulted.  Dr. Yevette Edwards recommending L1/2 decompression and transforaminal lumbar interbody fusion.  Originally came to [facility] and transferred to [facility] for surgical intervention. Underwent surgery on 5/16.

## 2023-04-22 NOTE — Progress Notes (Signed)
    Patient continues to report ongoing pain in the region of the right buttock and right inguinal region He has been getting Dilaudid about every 4 hours.  His pain level is about the same as it was yesterday.  It does increase with any type of motion, such as rotation of the low back.  The pain is described as a stabbing sensation.   Physical Exam: Vitals:   04/22/23 0009 04/22/23 0801  BP: 118/80 132/69  Pulse: 61 79  Resp: 17 18  Temp:  98.3 F (36.8 C)  SpO2: 95% 94%    Patient lying supine in the bed. He continues to appear uncomfortable  Pt with ongoing severe right buttock and hip pain, secondary to a very large disc herniation at L1-2, extending into the right lateral recess, foramen, and extraforaminal regions.  This is resulting in ongoing pain and weakness, and an inability to ambulate.  The patient does not feel able to care for himself at home.  - Patient will continue with pain management.  His Dilaudid was changed to every 4 hours, scheduled, per his request, which I do feel is reasonable, given the severity of his pain.  - Patient to be made n.p.o. after midnight tonight with surgery planned for tomorrow, as previously documented.  - I do very much appreciate the assistance of the medical team in helping to provide care for Mr. Berquist

## 2023-04-22 NOTE — Progress Notes (Signed)
Pt. Upset about having to call for pain medication. MD. Estill Bamberg verbal order Dilaudid 2mg  every 4 hours schedule

## 2023-04-22 NOTE — Anesthesia Preprocedure Evaluation (Signed)
Anesthesia Evaluation  Patient identified by MRN, date of birth, ID band Patient awake    Reviewed: Allergy & Precautions, NPO status , Patient's Chart, lab work & pertinent test results  Airway Mallampati: II  TM Distance: >3 FB Neck ROM: Full    Dental no notable dental hx. (+) Chipped,    Pulmonary Current Smoker and Patient abstained from smoking.   Pulmonary exam normal breath sounds clear to auscultation       Cardiovascular hypertension, Pt. on medications and Pt. on home beta blockers + CAD, + Past MI and + CABG (2000)  Normal cardiovascular exam+ dysrhythmias Atrial Fibrillation  Rhythm:Regular Rate:Normal     Neuro/Psych    GI/Hepatic ,GERD  ,,  Endo/Other  negative endocrine ROS    Renal/GU Lab Results      Component                Value               Date                      CREATININE               0.95                04/22/2023                BUN                      27 (H)              04/22/2023                NA                       138                 04/22/2023                K                        3.6                 04/22/2023                     Musculoskeletal  (+) Arthritis ,    Abdominal   Peds  Hematology Lab Results      Component                Value               Date                      WBC                      7.7                 04/20/2023                HGB                      15.0                04/20/2023                HCT  43.9                04/20/2023                MCV                      94.0                04/20/2023                PLT                      147 (L)             04/20/2023              Anesthesia Other Findings All;Vytorin, crestor  Reproductive/Obstetrics                             Anesthesia Physical Anesthesia Plan  ASA: 3  Anesthesia Plan: General   Post-op Pain Management: Ketamine IV* and  Ofirmev IV (intra-op)*   Induction: Intravenous  PONV Risk Score and Plan: 3 and Ondansetron and Treatment may vary due to age or medical condition  Airway Management Planned: Oral ETT  Additional Equipment:   Intra-op Plan:   Post-operative Plan: Extubation in OR  Informed Consent: I have reviewed the patients History and Physical, chart, labs and discussed the procedure including the risks, benefits and alternatives for the proposed anesthesia with the patient or authorized representative who has indicated his/her understanding and acceptance.     Dental advisory given  Plan Discussed with: CRNA  Anesthesia Plan Comments:        Anesthesia Quick Evaluation

## 2023-04-23 ENCOUNTER — Inpatient Hospital Stay (HOSPITAL_COMMUNITY): Payer: PPO | Admitting: Anesthesiology

## 2023-04-23 ENCOUNTER — Inpatient Hospital Stay (HOSPITAL_COMMUNITY): Payer: PPO

## 2023-04-23 ENCOUNTER — Encounter (HOSPITAL_COMMUNITY)
Admission: EM | Disposition: A | Discharge status: Home / Self Care | Payer: Self-pay | Source: Home / Self Care | Attending: Orthopedic Surgery

## 2023-04-23 ENCOUNTER — Encounter (HOSPITAL_COMMUNITY): Payer: Self-pay | Admitting: Internal Medicine

## 2023-04-23 ENCOUNTER — Other Ambulatory Visit: Payer: Self-pay

## 2023-04-23 DIAGNOSIS — M48061 Spinal stenosis, lumbar region without neurogenic claudication: Secondary | ICD-10-CM

## 2023-04-23 DIAGNOSIS — F17200 Nicotine dependence, unspecified, uncomplicated: Secondary | ICD-10-CM

## 2023-04-23 DIAGNOSIS — I2510 Atherosclerotic heart disease of native coronary artery without angina pectoris: Secondary | ICD-10-CM

## 2023-04-23 DIAGNOSIS — M5116 Intervertebral disc disorders with radiculopathy, lumbar region: Secondary | ICD-10-CM

## 2023-04-23 DIAGNOSIS — I10 Essential (primary) hypertension: Secondary | ICD-10-CM

## 2023-04-23 DIAGNOSIS — M5416 Radiculopathy, lumbar region: Secondary | ICD-10-CM | POA: Diagnosis present

## 2023-04-23 HISTORY — PX: TRANSFORAMINAL LUMBAR INTERBODY FUSION (TLIF) WITH PEDICLE SCREW FIXATION 1 LEVEL: SHX6141

## 2023-04-23 LAB — BASIC METABOLIC PANEL
Anion gap: 12 (ref 5–15)
BUN: 30 mg/dL — ABNORMAL HIGH (ref 8–23)
CO2: 25 mmol/L (ref 22–32)
Calcium: 9.7 mg/dL (ref 8.9–10.3)
Chloride: 100 mmol/L (ref 98–111)
Creatinine, Ser: 1.07 mg/dL (ref 0.61–1.24)
GFR, Estimated: >60 mL/min (ref >60)
Glucose, Bld: 112 mg/dL — ABNORMAL HIGH (ref 70–99)
Potassium: 3.9 mmol/L (ref 3.5–5.1)
Sodium: 137 mmol/L (ref 135–145)

## 2023-04-23 LAB — TYPE AND SCREEN
ABO/RH(D): AB POS
Antibody Screen: NEGATIVE

## 2023-04-23 LAB — CBC
HCT: 47.8 % (ref 39.0–52.0)
Hemoglobin: 16.8 g/dL (ref 13.0–17.0)
MCH: 32.3 pg (ref 26.0–34.0)
MCHC: 35.1 g/dL (ref 30.0–36.0)
MCV: 91.9 fL (ref 80.0–100.0)
Platelets: 154 10*3/uL (ref 150–400)
RBC: 5.2 MIL/uL (ref 4.22–5.81)
RDW: 12.6 % (ref 11.5–15.5)
WBC: 10.6 10*3/uL — ABNORMAL HIGH (ref 4.0–10.5)
nRBC: 0 % (ref 0.0–0.2)

## 2023-04-23 LAB — SURGICAL PCR SCREEN
MRSA, PCR: NEGATIVE
Staphylococcus aureus: POSITIVE — AB

## 2023-04-23 SURGERY — TRANSFORAMINAL LUMBAR INTERBODY FUSION (TLIF) WITH PEDICLE SCREW FIXATION 1 LEVEL
Anesthesia: General | Laterality: Right

## 2023-04-23 MED ORDER — KETAMINE HCL 50 MG/5ML IJ SOSY
PREFILLED_SYRINGE | INTRAMUSCULAR | Status: AC
  Filled 2023-04-23: qty 5

## 2023-04-23 MED ORDER — THROMBIN 20000 UNITS EX SOLR
CUTANEOUS | Status: AC
  Filled 2023-04-23: qty 20000

## 2023-04-23 MED ORDER — MUPIROCIN 2 % EX OINT
1.0000 | TOPICAL_OINTMENT | Freq: Two times a day (BID) | CUTANEOUS | Status: DC
  Administered 2023-04-23: 1 via NASAL
  Filled 2023-04-23: qty 22

## 2023-04-23 MED ORDER — CEFAZOLIN SODIUM 1 G IJ SOLR
INTRAMUSCULAR | Status: AC
  Filled 2023-04-23: qty 30

## 2023-04-23 MED ORDER — MENTHOL 3 MG MT LOZG: 1.0000 | LOZENGE | OROMUCOSAL | Status: DC | PRN

## 2023-04-23 MED ORDER — MIDAZOLAM HCL 2 MG/2ML IJ SOLN
INTRAMUSCULAR | Status: DC | PRN
  Administered 2023-04-23: 2 mg via INTRAVENOUS

## 2023-04-23 MED ORDER — POTASSIUM CHLORIDE IN NACL 20-0.9 MEQ/L-% IV SOLN: INTRAVENOUS | Status: DC

## 2023-04-23 MED ORDER — FLEET ENEMA 7-19 GM/118ML RE ENEM: 1.0000 | ENEMA | Freq: Once | RECTAL | Status: DC | PRN

## 2023-04-23 MED ORDER — ROCURONIUM BROMIDE 10 MG/ML (PF) SYRINGE
PREFILLED_SYRINGE | INTRAVENOUS | Status: DC | PRN
  Administered 2023-04-23: 80 mg via INTRAVENOUS
  Administered 2023-04-23 (×3): 20 mg via INTRAVENOUS

## 2023-04-23 MED ORDER — MIDAZOLAM HCL 2 MG/2ML IJ SOLN
INTRAMUSCULAR | Status: AC
  Filled 2023-04-23: qty 2

## 2023-04-23 MED ORDER — CEFAZOLIN SODIUM-DEXTROSE 2-4 GM/100ML-% IV SOLN
2.0000 g | Freq: Three times a day (TID) | INTRAVENOUS | Status: AC
  Administered 2023-04-23 – 2023-04-24 (×2): 2 g via INTRAVENOUS
  Filled 2023-04-23 (×2): qty 100

## 2023-04-23 MED ORDER — ROCURONIUM BROMIDE 10 MG/ML (PF) SYRINGE
PREFILLED_SYRINGE | INTRAVENOUS | Status: AC
  Filled 2023-04-23: qty 20

## 2023-04-23 MED ORDER — SODIUM CHLORIDE 0.9% FLUSH
3.0000 mL | Freq: Two times a day (BID) | INTRAVENOUS | Status: DC
  Administered 2023-04-23 – 2023-04-25 (×4): 3 mL via INTRAVENOUS

## 2023-04-23 MED ORDER — ALUM & MAG HYDROXIDE-SIMETH 200-200-20 MG/5ML PO SUSP: 30.0000 mL | Freq: Four times a day (QID) | ORAL | Status: DC | PRN

## 2023-04-23 MED ORDER — SODIUM CHLORIDE 0.9 % IV SOLN
250.0000 mL | INTRAVENOUS | Status: DC
  Administered 2023-04-23: 250 mL via INTRAVENOUS

## 2023-04-23 MED ORDER — ACETAMINOPHEN 10 MG/ML IV SOLN: 1000.0000 mg | Freq: Once | INTRAVENOUS | Status: DC | PRN

## 2023-04-23 MED ORDER — OXYCODONE HCL 5 MG PO TABS: 5.0000 mg | ORAL_TABLET | Freq: Once | ORAL | Status: DC | PRN

## 2023-04-23 MED ORDER — KETAMINE HCL 10 MG/ML IJ SOLN
INTRAMUSCULAR | Status: DC | PRN
  Administered 2023-04-23: 30 mg via INTRAVENOUS
  Administered 2023-04-23: 10 mg via INTRAVENOUS

## 2023-04-23 MED ORDER — ONDANSETRON HCL 4 MG/2ML IJ SOLN
INTRAMUSCULAR | Status: AC
  Filled 2023-04-23: qty 4

## 2023-04-23 MED ORDER — EPINEPHRINE PF 1 MG/ML IJ SOLN
INTRAMUSCULAR | Status: AC
  Filled 2023-04-23: qty 1

## 2023-04-23 MED ORDER — EPHEDRINE SULFATE-NACL 50-0.9 MG/10ML-% IV SOSY
PREFILLED_SYRINGE | INTRAVENOUS | Status: DC | PRN
  Administered 2023-04-23: 10 mg via INTRAVENOUS
  Administered 2023-04-23: 5 mg via INTRAVENOUS
  Administered 2023-04-23: 10 mg via INTRAVENOUS

## 2023-04-23 MED ORDER — HYDROMORPHONE HCL 1 MG/ML IJ SOLN
INTRAMUSCULAR | Status: AC
  Administered 2023-04-23: 0.5 mg
  Filled 2023-04-23: qty 1

## 2023-04-23 MED ORDER — SENNOSIDES-DOCUSATE SODIUM 8.6-50 MG PO TABS: 1.0000 | ORAL_TABLET | Freq: Every evening | ORAL | Status: DC | PRN

## 2023-04-23 MED ORDER — ACETAMINOPHEN 10 MG/ML IV SOLN
INTRAVENOUS | Status: AC
  Filled 2023-04-23: qty 100

## 2023-04-23 MED ORDER — ALBUMIN HUMAN 5 % IV SOLN: INTRAVENOUS | Status: DC | PRN

## 2023-04-23 MED ORDER — ZOLPIDEM TARTRATE 5 MG PO TABS: 5.0000 mg | ORAL_TABLET | Freq: Every evening | ORAL | Status: DC | PRN

## 2023-04-23 MED ORDER — ONDANSETRON HCL 4 MG/2ML IJ SOLN: 4.0000 mg | Freq: Once | INTRAMUSCULAR | Status: DC | PRN

## 2023-04-23 MED ORDER — ACETAMINOPHEN 325 MG PO TABS
650.0000 mg | ORAL_TABLET | ORAL | Status: DC | PRN
  Administered 2023-04-24: 650 mg via ORAL
  Filled 2023-04-23 (×2): qty 2

## 2023-04-23 MED ORDER — BUPIVACAINE HCL (PF) 0.25 % IJ SOLN
INTRAMUSCULAR | Status: AC
  Filled 2023-04-23: qty 30

## 2023-04-23 MED ORDER — PROPOFOL 500 MG/50ML IV EMUL
INTRAVENOUS | Status: DC | PRN
  Administered 2023-04-23: 50 ug/kg/min via INTRAVENOUS

## 2023-04-23 MED ORDER — SURGIFLO WITH THROMBIN (HEMOSTATIC MATRIX KIT) OPTIME
TOPICAL | Status: DC | PRN
  Administered 2023-04-23: 1

## 2023-04-23 MED ORDER — METHOCARBAMOL 500 MG PO TABS
500.0000 mg | ORAL_TABLET | Freq: Four times a day (QID) | ORAL | Status: DC | PRN
  Administered 2023-04-23 – 2023-04-24 (×4): 1000 mg via ORAL
  Filled 2023-04-23 (×6): qty 2

## 2023-04-23 MED ORDER — SUGAMMADEX SODIUM 200 MG/2ML IV SOLN: INTRAVENOUS | Status: DC | PRN

## 2023-04-23 MED ORDER — ACETAMINOPHEN 10 MG/ML IV SOLN
INTRAVENOUS | Status: DC | PRN
  Administered 2023-04-23: 1000 mg via INTRAVENOUS

## 2023-04-23 MED ORDER — FENTANYL CITRATE (PF) 250 MCG/5ML IJ SOLN
INTRAMUSCULAR | Status: AC
  Filled 2023-04-23: qty 5

## 2023-04-23 MED ORDER — PROPOFOL 10 MG/ML IV BOLUS
INTRAVENOUS | Status: DC | PRN
  Administered 2023-04-23: 200 mg via INTRAVENOUS
  Administered 2023-04-23: 30 mg via INTRAVENOUS

## 2023-04-23 MED ORDER — ONDANSETRON HCL 4 MG PO TABS: 4.0000 mg | ORAL_TABLET | Freq: Four times a day (QID) | ORAL | Status: DC | PRN

## 2023-04-23 MED ORDER — ROCURONIUM BROMIDE 10 MG/ML (PF) SYRINGE
PREFILLED_SYRINGE | INTRAVENOUS | Status: AC
  Filled 2023-04-23: qty 10

## 2023-04-23 MED ORDER — CEFAZOLIN SODIUM-DEXTROSE 2-3 GM-%(50ML) IV SOLR
INTRAVENOUS | Status: DC | PRN
  Administered 2023-04-23: 2 g via INTRAVENOUS

## 2023-04-23 MED ORDER — BUPIVACAINE LIPOSOME 1.3 % IJ SUSP
INTRAMUSCULAR | Status: DC | PRN
  Administered 2023-04-23: 20 mL

## 2023-04-23 MED ORDER — HYDROMORPHONE HCL 1 MG/ML IJ SOLN
0.2500 mg | INTRAMUSCULAR | Status: DC | PRN
  Administered 2023-04-23 (×2): 0.5 mg via INTRAVENOUS

## 2023-04-23 MED ORDER — SUGAMMADEX SODIUM 200 MG/2ML IV SOLN
INTRAVENOUS | Status: DC | PRN
  Administered 2023-04-23: 400 mg via INTRAVENOUS

## 2023-04-23 MED ORDER — SODIUM CHLORIDE 0.9% FLUSH: 3.0000 mL | INTRAVENOUS | Status: DC | PRN

## 2023-04-23 MED ORDER — BUPIVACAINE LIPOSOME 1.3 % IJ SUSP
INTRAMUSCULAR | Status: AC
  Filled 2023-04-23: qty 20

## 2023-04-23 MED ORDER — LIDOCAINE 2% (20 MG/ML) 5 ML SYRINGE
INTRAMUSCULAR | Status: DC | PRN
  Administered 2023-04-23: 80 mg via INTRAVENOUS

## 2023-04-23 MED ORDER — ACETAMINOPHEN 650 MG RE SUPP: 650.0000 mg | RECTAL | Status: DC | PRN

## 2023-04-23 MED ORDER — CHLORHEXIDINE GLUCONATE CLOTH 2 % EX PADS: 6.0000 | MEDICATED_PAD | Freq: Every day | CUTANEOUS | Status: DC

## 2023-04-23 MED ORDER — LIDOCAINE 2% (20 MG/ML) 5 ML SYRINGE
INTRAMUSCULAR | Status: AC
  Filled 2023-04-23: qty 15

## 2023-04-23 MED ORDER — CHLORHEXIDINE GLUCONATE 0.12 % MT SOLN
OROMUCOSAL | Status: AC
  Filled 2023-04-23: qty 15

## 2023-04-23 MED ORDER — PHENOL 1.4 % MT LIQD: 1.0000 | OROMUCOSAL | Status: DC | PRN

## 2023-04-23 MED ORDER — AMISULPRIDE (ANTIEMETIC) 5 MG/2ML IV SOLN: 10.0000 mg | Freq: Once | INTRAVENOUS | Status: DC | PRN

## 2023-04-23 MED ORDER — DOCUSATE SODIUM 100 MG PO CAPS
100.0000 mg | ORAL_CAPSULE | Freq: Two times a day (BID) | ORAL | Status: DC
  Administered 2023-04-23 – 2023-04-24 (×2): 100 mg via ORAL
  Filled 2023-04-23 (×2): qty 1

## 2023-04-23 MED ORDER — BISACODYL 5 MG PO TBEC: 5.0000 mg | DELAYED_RELEASE_TABLET | Freq: Every day | ORAL | Status: DC | PRN

## 2023-04-23 MED ORDER — ONDANSETRON HCL 4 MG/2ML IJ SOLN
INTRAMUSCULAR | Status: DC | PRN
  Administered 2023-04-23: 4 mg via INTRAVENOUS

## 2023-04-23 MED ORDER — PROPOFOL 10 MG/ML IV BOLUS
INTRAVENOUS | Status: AC
  Filled 2023-04-23: qty 20

## 2023-04-23 MED ORDER — GLYCOPYRROLATE PF 0.2 MG/ML IJ SOSY
PREFILLED_SYRINGE | INTRAMUSCULAR | Status: DC | PRN
  Administered 2023-04-23: .2 mg via INTRAVENOUS

## 2023-04-23 MED ORDER — 0.9 % SODIUM CHLORIDE (POUR BTL) OPTIME
TOPICAL | Status: DC | PRN
  Administered 2023-04-23: 1000 mL

## 2023-04-23 MED ORDER — OXYCODONE HCL 5 MG/5ML PO SOLN: 5.0000 mg | Freq: Once | ORAL | Status: DC | PRN

## 2023-04-23 MED ORDER — THROMBIN 20000 UNITS EX SOLR
CUTANEOUS | Status: DC | PRN
  Administered 2023-04-23: 20 mL

## 2023-04-23 MED ORDER — PHENYLEPHRINE HCL-NACL 20-0.9 MG/250ML-% IV SOLN
INTRAVENOUS | Status: DC | PRN
  Administered 2023-04-23: 50 ug/min via INTRAVENOUS

## 2023-04-23 MED ORDER — BUPIVACAINE-EPINEPHRINE 0.25% -1:200000 IJ SOLN
INTRAMUSCULAR | Status: DC | PRN
  Administered 2023-04-23: 5 mL
  Administered 2023-04-23: 20 mL

## 2023-04-23 MED ORDER — LACTATED RINGERS IV SOLN: INTRAVENOUS | Status: DC | PRN

## 2023-04-23 MED ORDER — ONDANSETRON HCL 4 MG/2ML IJ SOLN
4.0000 mg | Freq: Four times a day (QID) | INTRAMUSCULAR | Status: DC | PRN
  Administered 2023-04-23 – 2023-04-24 (×2): 4 mg via INTRAVENOUS
  Filled 2023-04-23 (×2): qty 2

## 2023-04-23 MED ORDER — METHOCARBAMOL 1000 MG/10ML IJ SOLN: 500.0000 mg | Freq: Four times a day (QID) | INTRAVENOUS | Status: DC | PRN

## 2023-04-23 MED ORDER — HYDROMORPHONE HCL 1 MG/ML IJ SOLN
INTRAMUSCULAR | Status: AC
  Filled 2023-04-23: qty 1

## 2023-04-23 MED ORDER — PHENYLEPHRINE 80 MCG/ML (10ML) SYRINGE FOR IV PUSH (FOR BLOOD PRESSURE SUPPORT)
PREFILLED_SYRINGE | INTRAVENOUS | Status: DC | PRN
  Administered 2023-04-23: 160 ug via INTRAVENOUS
  Administered 2023-04-23: 200 ug via INTRAVENOUS
  Administered 2023-04-23 (×2): 240 ug via INTRAVENOUS
  Administered 2023-04-23: 200 ug via INTRAVENOUS

## 2023-04-23 MED ORDER — FENTANYL CITRATE (PF) 250 MCG/5ML IJ SOLN
INTRAMUSCULAR | Status: DC | PRN
  Administered 2023-04-23: 150 ug via INTRAVENOUS
  Administered 2023-04-23: 100 ug via INTRAVENOUS
  Administered 2023-04-23: 50 ug via INTRAVENOUS
  Administered 2023-04-23: 100 ug via INTRAVENOUS

## 2023-04-23 SURGICAL SUPPLY — 89 items
AGENT HMST KT MTR STRL THRMB (HEMOSTASIS) ×1
APL SKNCLS STERI-STRIP NONHPOA (GAUZE/BANDAGES/DRESSINGS) ×1
BAG COUNTER SPONGE SURGICOUNT (BAG) ×2 IMPLANT
BAG SPNG CNTER NS LX DISP (BAG) ×1
BENZOIN TINCTURE PRP APPL 2/3 (GAUZE/BANDAGES/DRESSINGS) ×2 IMPLANT
BLADE CLIPPER SURG (BLADE) IMPLANT
BUR PRESCISION 1.7 ELITE (BURR) ×2 IMPLANT
BUR ROUND FLUTED 5 RND (BURR) ×2 IMPLANT
BUR ROUND PRECISION 4.0 (BURR) IMPLANT
BUR SABER RD CUTTING 3.0 (BURR) IMPLANT
CAGE SABLE 10X26 6-12 8D (Cage) IMPLANT
CANNULA GRAFT BNE VG PRE-FILL (Bone Implant) IMPLANT
CNTNR URN SCR LID CUP LEK RST (MISCELLANEOUS) ×2 IMPLANT
CONT SPEC 4OZ STRL OR WHT (MISCELLANEOUS)
COVER BACK TABLE 60X90IN (DRAPES) ×2 IMPLANT
COVER MAYO STAND STRL (DRAPES) ×4 IMPLANT
COVER SURGICAL LIGHT HANDLE (MISCELLANEOUS) ×2 IMPLANT
DRAIN CHANNEL 15F RND FF W/TCR (WOUND CARE) IMPLANT
DRAPE C-ARM 42X72 X-RAY (DRAPES) ×2 IMPLANT
DRAPE C-ARMOR (DRAPES) IMPLANT
DRAPE POUCH INSTRU U-SHP 10X18 (DRAPES) ×2 IMPLANT
DRAPE SURG 17X23 STRL (DRAPES) ×8 IMPLANT
DURAPREP 26ML APPLICATOR (WOUND CARE) ×2 IMPLANT
ELECT BLADE 4.0 EZ CLEAN MEGAD (MISCELLANEOUS) ×1
ELECT CAUTERY BLADE 6.4 (BLADE) ×2 IMPLANT
ELECT REM PT RETURN 9FT ADLT (ELECTROSURGICAL) ×1
ELECTRODE BLDE 4.0 EZ CLN MEGD (MISCELLANEOUS) ×2 IMPLANT
ELECTRODE REM PT RTRN 9FT ADLT (ELECTROSURGICAL) ×2 IMPLANT
EVACUATOR SILICONE 100CC (DRAIN) IMPLANT
FILTER STRAW FLUID ASPIR (MISCELLANEOUS) ×2 IMPLANT
GAUZE 4X4 16PLY ~~LOC~~+RFID DBL (SPONGE) ×2 IMPLANT
GAUZE SPONGE 4X4 12PLY STRL (GAUZE/BANDAGES/DRESSINGS) ×2 IMPLANT
GLOVE BIO SURGEON STRL SZ 6.5 (GLOVE) ×2 IMPLANT
GLOVE BIO SURGEON STRL SZ8 (GLOVE) ×2 IMPLANT
GLOVE BIOGEL PI IND STRL 7.0 (GLOVE) ×2 IMPLANT
GLOVE BIOGEL PI IND STRL 8 (GLOVE) ×2 IMPLANT
GLOVE SURG ENC MOIS LTX SZ6.5 (GLOVE) ×2 IMPLANT
GOWN STRL REUS W/ TWL LRG LVL3 (GOWN DISPOSABLE) ×4 IMPLANT
GOWN STRL REUS W/ TWL XL LVL3 (GOWN DISPOSABLE) ×2 IMPLANT
GOWN STRL REUS W/TWL LRG LVL3 (GOWN DISPOSABLE) ×1
GOWN STRL REUS W/TWL XL LVL3 (GOWN DISPOSABLE) ×1
GRAFT BONE CANNULA VIVIGEN 3 (Bone Implant) ×2 IMPLANT
IV CATH 14GX2 1/4 (CATHETERS) ×2 IMPLANT
KIT BASIN OR (CUSTOM PROCEDURE TRAY) ×2 IMPLANT
KIT POSITION SURG JACKSON T1 (MISCELLANEOUS) ×2 IMPLANT
KIT TURNOVER KIT B (KITS) ×2 IMPLANT
MARKER SKIN DUAL TIP RULER LAB (MISCELLANEOUS) ×4 IMPLANT
NDL 18GX1X1/2 (RX/OR ONLY) (NEEDLE) ×2 IMPLANT
NDL 22X1.5 STRL (OR ONLY) (MISCELLANEOUS) ×4 IMPLANT
NDL HYPO 25GX1X1/2 BEV (NEEDLE) ×2 IMPLANT
NDL SPNL 18GX3.5 QUINCKE PK (NEEDLE) ×4 IMPLANT
NEEDLE 18GX1X1/2 (RX/OR ONLY) (NEEDLE) ×1 IMPLANT
NEEDLE 22X1.5 STRL (OR ONLY) (MISCELLANEOUS) ×2 IMPLANT
NEEDLE HYPO 25GX1X1/2 BEV (NEEDLE) ×1 IMPLANT
NEEDLE SPNL 18GX3.5 QUINCKE PK (NEEDLE) ×2 IMPLANT
NS IRRIG 1000ML POUR BTL (IV SOLUTION) ×2 IMPLANT
PACK LAMINECTOMY ORTHO (CUSTOM PROCEDURE TRAY) ×2 IMPLANT
PACK UNIVERSAL I (CUSTOM PROCEDURE TRAY) ×2 IMPLANT
PAD ARMBOARD 7.5X6 YLW CONV (MISCELLANEOUS) ×4 IMPLANT
PATTIES SURGICAL .5 X1 (DISPOSABLE) ×2 IMPLANT
PATTIES SURGICAL .5X1.5 (GAUZE/BANDAGES/DRESSINGS) ×2 IMPLANT
PUTTY DBX 1CC (Putty) ×1 IMPLANT
PUTTY DBX 1CC DEPUY (Putty) IMPLANT
ROD PRE LORDOSED 5.5X45 (Rod) IMPLANT
SCREW CORT FIX FEN 5.5X6X35MM (Screw) IMPLANT
SCREW SET SINGLE INNER (Screw) IMPLANT
SCREW VIPER CORT FIX 6.00X30 (Screw) IMPLANT
SPONGE INTESTINAL PEANUT (DISPOSABLE) ×2 IMPLANT
SPONGE SURGIFOAM ABS GEL 100 (HEMOSTASIS) ×2 IMPLANT
STRIP CLOSURE SKIN 1/2X4 (GAUZE/BANDAGES/DRESSINGS) ×4 IMPLANT
SURGIFLO W/THROMBIN 8M KIT (HEMOSTASIS) IMPLANT
SUT MNCRL AB 4-0 PS2 18 (SUTURE) ×2 IMPLANT
SUT VIC AB 0 CT1 18XCR BRD 8 (SUTURE) ×2 IMPLANT
SUT VIC AB 0 CT1 8-18 (SUTURE)
SUT VIC AB 1 CT1 18XCR BRD 8 (SUTURE) ×2 IMPLANT
SUT VIC AB 1 CT1 8-18 (SUTURE) ×1
SUT VIC AB 2-0 CT2 18 VCP726D (SUTURE) ×2 IMPLANT
SYR 20ML LL LF (SYRINGE) ×4 IMPLANT
SYR BULB IRRIG 60ML STRL (SYRINGE) ×2 IMPLANT
SYR CONTROL 10ML LL (SYRINGE) ×4 IMPLANT
SYR TB 1ML LUER SLIP (SYRINGE) ×2 IMPLANT
TAP EXPEDIUM DL 4.35 (INSTRUMENTS) IMPLANT
TAP EXPEDIUM DL 5.0 (INSTRUMENTS) IMPLANT
TAP EXPEDIUM DL 6.0 (INSTRUMENTS) IMPLANT
TAPE CLOTH SURG 4X10 WHT LF (GAUZE/BANDAGES/DRESSINGS) IMPLANT
TRAY FOLEY MTR SLVR 16FR STAT (SET/KITS/TRAYS/PACK) ×2 IMPLANT
TUBE FUNNEL GL DISP (ORTHOPEDIC DISPOSABLE SUPPLIES) IMPLANT
WATER STERILE IRR 1000ML POUR (IV SOLUTION) ×2 IMPLANT
YANKAUER SUCT BULB TIP NO VENT (SUCTIONS) ×2 IMPLANT

## 2023-04-23 NOTE — Anesthesia Procedure Notes (Signed)
Procedure Name: Intubation Date/Time: 04/23/2023 4:00 PM  Performed by: Darryl Nestle, CRNAPre-anesthesia Checklist: Patient identified, Emergency Drugs available, Suction available and Patient being monitored Patient Re-evaluated:Patient Re-evaluated prior to induction Oxygen Delivery Method: Circle system utilized Preoxygenation: Pre-oxygenation with 100% oxygen Induction Type: IV induction Ventilation: Mask ventilation without difficulty Laryngoscope Size: Mac and 3 Grade View: Grade II Tube type: Oral Tube size: 7.0 mm Number of attempts: 1 Airway Equipment and Method: Stylet and Oral airway Placement Confirmation: ETT inserted through vocal cords under direct vision, positive ETCO2 and breath sounds checked- equal and bilateral Secured at: 22 cm Tube secured with: Tape Dental Injury: Teeth and Oropharynx as per pre-operative assessment

## 2023-04-23 NOTE — TOC Initial Note (Signed)
Transition of Care [facility]) - Initial/Assessment Note    Patient Details  Name: Lee Mclaughlin MRN: [id number] Date of Birth: Jul 22, 1951  Transition of Care [facility]) CM/SW Contact:    Epifanio Lesches, RN Phone Number: 04/23/2023, 9:45 AM  Clinical Narrative:                 Presented with c/o back pain. Lumbar MRI showed L1-L2 disc extrusion with severe canal stenosis.  From home with wife. States prior to admit independent with ADL's. Owns RW and crutches.  Plan: surgery today   Pt states wife to assist with care once d/c. One step to enter into home. Pt states no RX MED concerns. Wife to provide transportation to home once d/c ready.  TOC team following and will assist with d/c needs....   Barriers to Discharge: Continued Medical Work up   Patient Goals and CMS Choice            Expected Discharge Plan and Services   Discharge Planning Services: CM Consult   Living arrangements for the past 2 months: Single Family Home                                      Prior Living Arrangements/Services Living arrangements for the past 2 months: Single Family Home Lives with:: Spouse Patient language and need for interpreter reviewed:: Yes Do you feel safe going back to the place where you live?: Yes      Need for Family Participation in Patient Care: Yes (Comment) Care giver support system in place?: Yes (comment) Current home services: DME (crutches, RW) Criminal Activity/Legal Involvement Pertinent to Current Situation/Hospitalization: No - Comment as needed  Activities of Daily Living Home Assistive Devices/Equipment: Eyeglasses, Cane (specify quad or straight), Walker (specify type), Crutches ADL Screening (condition at time of admission) Patient's cognitive ability adequate to safely complete daily activities?: Yes Is the patient deaf or have difficulty hearing?: No Does the patient have difficulty seeing, even when wearing glasses/contacts?: No Does the patient  have difficulty concentrating, remembering, or making decisions?: No Patient able to express need for assistance with ADLs?: Yes Does the patient have difficulty dressing or bathing?: Yes Independently performs ADLs?: Yes (appropriate for developmental age) Does the patient have difficulty walking or climbing stairs?: Yes Weakness of Legs: Right Weakness of Arms/Hands: None  Permission Sought/Granted   Permission granted to share information with : Yes, Verbal Permission Granted  Share Information with NAME: Latrelle Dodrill  Spouse  [phone]           Emotional Assessment Appearance:: Appears stated age Attitude/Demeanor/Rapport: Engaged Affect (typically observed): Accepting Orientation: : Oriented to Situation, Oriented to  Time, Oriented to Place, Oriented to Self Alcohol / Substance Use: Not Applicable Psych Involvement: No (comment)  Admission diagnosis:  Lumbar radiculopathy [M54.16] Lumbar spinal stenosis [M48.061] Spinal stenosis of lumbar region with radiculopathy [M48.061, M54.16] Patient Active Problem List   Diagnosis Date Noted   Spinal stenosis of lumbar region with radiculopathy 04/22/2023   Lumbar spinal stenosis 04/21/2023   Essential hypertension 04/21/2023   Obese 03/16/2020   S/P right TH revision 03/15/2020   Statin myopathy 12/07/2019   right total hip arthroplasty with dislocation of hip (HCC) 08/14/2019   Primary localized osteoarthritis of right hip 05/24/2019   Mixed hyperlipidemia 03/21/2019   Typical atrial flutter (HCC) - s/p ablation - off anticoagulation 09/24/2018   Erectile dysfunction 03/19/2018  Seasonal allergic rhinitis due to pollen 03/19/2018   Inflamed seborrheic keratosis 10/31/2016   Primary localized osteoarthritis of right knee 09/22/2016   DJD (degenerative joint disease) of knee 07/23/2015   CAD (coronary artery disease) 11/22/2013   S/P CABG x 4 11/22/2013   Dyslipidemia 11/22/2013   Cardiomyopathy (HCC) 11/22/2013    Statin intolerance 11/22/2013   PCP:  Barbie Banner, MD Pharmacy:   [id number] - [place], [place] - [phone] [place] [address] Phone: [phone] Fax: [phone]  [id number] [place] [facility] - [address] [place] [address] Phone: [phone] Fax: [phone]  [id number] [place], [place] - [address] AT [facility] [address] [place] [address] Phone: [phone] Fax: [phone]     Social Determinants of Health (SDOH) Social History: SDOH Screenings   Food Insecurity: No Food Insecurity (04/21/2023)  Housing: Low Risk  (04/21/2023)  Transportation Needs: No Transportation Needs (04/21/2023)  Utilities: Not At Risk (04/21/2023)  Tobacco Use: High Risk (04/20/2023)   SDOH Interventions:     Readmission Risk Interventions     No data to display

## 2023-04-23 NOTE — H&P (Signed)
PREOPERATIVE H&P  Chief Complaint: Right leg pain and weakness  HPI: Lee Mclaughlin is a 72 y.o. male who presents with ongoing pain in the right inguinal region and thigh.  MRI reveals a large, acute, right-sided L1-2 disc herniation.  This is severely compressing the right L1 and L2 nerves.  Patient has failed multiple forms of conservative care and continues to have pain (see office notes for additional details regarding the patient's full course of treatment)  Past Medical History:  Diagnosis Date   Coronary artery disease    COVID-19    In december 2020   Dyslipidemia    Dysrhythmia    a flutter   GERD (gastroesophageal reflux disease)    H/O cardiovascular stress test 10/19/2012   MET test - good effort, peak VO2 of almost 112% predicted, HR 79% predicted, mildly ischemia at end of exercise   History of hyperthyroidism as a child    History of nuclear stress test 03/2011   bruce myoview; mild perfusion defect in mid anteroseptal, apical septal, apical regions (infarct/scar/breast attenuation); post-stress EF 55%; no signficant ischemia   Ischemic cardiomyopathy    history of    Myocardial infarction (HCC)    Pneumonia    as a kid   PONV (postoperative nausea and vomiting)    Primary localized osteoarthritis of right hip 05/24/2019   Primary localized osteoarthritis of right knee    Restless leg    right total hip arthroplasty with dislocation of hip (HCC) 08/14/2019   S/P CABG (coronary artery bypass graft) 2000   Staphylococcal arthritis of right knee (HCC) 2004   Wears glasses    Past Surgical History:  Procedure Laterality Date   A-FLUTTER ABLATION N/A 10/01/2018   Procedure: A-FLUTTER ABLATION;  Surgeon: Marinus Maw, MD;  Location: [facility];  Service: Cardiovascular;  Laterality: N/A;   A-FLUTTER ABLATION N/A 11/02/2018   Procedure: A-FLUTTER ABLATION;  Surgeon: Marinus Maw, MD;  Location: [facility];  Service: Cardiovascular;   Laterality: N/A;   CARDIAC CATHETERIZATION  06/01/2007   no obstructive CAD, patent grafts, EF 40%, apical hypokinesis, anterolateral hypokinesis (Dr. Laurell Josephs)    CERVICAL SPINE SURGERY     CHOLECYSTECTOMY     COLONOSCOPY     CORONARY ARTERY BYPASS GRAFT  06/05/1999   LIMA to LAD, SVG to diagonal 1, SVG to PDA of Cfx, right radial graft to OM1 (Dr. Molinda Bailiff)    ESOPHAGOGASTRODUODENOSCOPY     EYE SURGERY     KNEE SURGERY Right    pt. states x 6    TOTAL HIP ARTHROPLASTY Right 05/24/2019   Procedure: TOTAL HIP ARTHROPLASTY;  Surgeon: Teryl Lucy, MD;  Location: [facility];  Service: Orthopedics;  Laterality: Right;   TOTAL HIP REVISION Right 03/15/2020   Procedure: TOTAL HIP REVISION, posterior approach;  Surgeon: Durene Romans, MD;  Location: [facility];  Service: Orthopedics;  Laterality: Right;  90 mins posterior approach   TOTAL KNEE ARTHROPLASTY Right 07/23/2015   Procedure: RIGHT TOTAL KNEE ARTHROPLASTY;  Surgeon: Salvatore Marvel, MD;  Location: [facility];  Service: Orthopedics;  Laterality: Right;   TRANSFORAMINAL LUMBAR INTERBODY FUSION (TLIF) WITH PEDICLE SCREW FIXATION 2 LEVEL Left 11/27/2021   Procedure: LEFT-SIDED LUMBAR 3- LUMBAR 4, LUMBAR 4 - LUMBAR 5 TRANSFORAMINAL LUMBAR INTERBODY FUSION WITH INSTRUMENTATION AND ALLOGRAFT;  Surgeon: Estill Bamberg, MD;  Location: [facility] OR;  Service: Orthopedics;  Laterality: Left;   TRANSTHORACIC ECHOCARDIOGRAM  02/2008   EF 40%, severe  apical wall hypokinesis, mod-severe anterior wall hypokinesis; RV mildly dilated; mild mitral annular calcif; mild TR, RSVP 30-4mmHg   Social History   Socioeconomic History   Marital status: Married    Spouse name: Not on file   Number of children: 2   Years of education: 12   Highest education level: Not on file  Occupational History    Employer: [employer]  Tobacco Use   Smoking status: Some Days    Types: Cigars   Smokeless tobacco: Never   Tobacco comments:    cigar occassionally  Vaping Use   Vaping  Use: Never used  Substance and Sexual Activity   Alcohol use: Yes    Alcohol/week: 0.0 standard drinks of alcohol    Comment: minimal   Drug use: No   Sexual activity: Not Currently  Other Topics Concern   Not on file  Social History Narrative   Not on file   Social Determinants of Health   Financial Resource Strain: Not on file  Food Insecurity: No Food Insecurity (04/21/2023)   Hunger Vital Sign    Worried About Running Out of Food in the Last Year: Never true    Ran Out of Food in the Last Year: Never true  Transportation Needs: No Transportation Needs (04/21/2023)   PRAPARE - Administrator, Civil Service (Medical): No    Lack of Transportation (Non-Medical): No  Physical Activity: Not on file  Stress: Not on file  Social Connections: Not on file   Family History  Problem Relation Age of Onset   CAD Father        s/p CABGx4 at 40   Hyperlipidemia Father    Allergies  Allergen Reactions   Morphine And Codeine Nausea And Vomiting   Crestor [Rosuvastatin] Other (See Comments)    Myalgias    Vytorin [Ezetimibe-Simvastatin] Other (See Comments)    Myalgias    Prior to Admission medications   Medication Sig Start Date End Date Taking? Authorizing Provider  albuterol (VENTOLIN HFA) 108 (90 Base) MCG/ACT inhaler Inhale 2 puffs into the lungs every 6 (six) hours as needed for wheezing or shortness of breath.   Yes [provider]  carvedilol (COREG) 12.5 MG tablet Take 12.5 mg by mouth 2 (two) times daily with a meal.   Yes [provider]  DULoxetine (CYMBALTA) 30 MG capsule Take 30 mg by mouth 2 (two) times daily. 04/17/23  Yes [provider]  gabapentin (NEURONTIN) 300 MG capsule Take 300 mg by mouth 2 (two) times daily. 04/16/23  Yes [provider]  gabapentin (NEURONTIN) 600 MG tablet TAKE 1 TABLET BY MOUTH AT BEDTIME. 11/03/22  Yes Olalere, Adewale A, MD  loratadine (CLARITIN) 10 MG tablet Take 10 mg by mouth daily.   Yes  [provider]  meloxicam (MOBIC) 15 MG tablet Take 15 mg by mouth daily. 02/05/23  Yes [provider]  montelukast (SINGULAIR) 10 MG tablet Take 10 mg by mouth in the morning. 12/29/22  Yes [provider]  olmesartan (BENICAR) 40 MG tablet Take 40 mg by mouth every morning.    Yes [provider]  oxyCODONE (ROXICODONE) 5 MG immediate release tablet Take 1 tablet (5 mg total) by mouth every 6 (six) hours as needed for severe pain. 04/20/23  Yes Sloan Leiter, DO  REPATHA SURECLICK 140 MG/ML SOAJ INJECT 140 MG INTO THE SKIN EVERY 14 (FOURTEEN) DAYS. 05/30/22  Yes Hilty, Lisette Abu, MD  lidocaine (LIDODERM) 5 % Place  1 patch onto the skin daily as needed. Remove & Discard patch within 12 hours or as directed by MD 04/20/23   Sloan Leiter, DO  oxyCODONE-acetaminophen (PERCOCET/ROXICET) 5-325 MG tablet Take 1-2 tablets by mouth every 4 (four) hours as needed for severe pain. Patient not taking: Reported on 04/21/2023 11/28/21   Estill Bamberg, MD     All other systems have been reviewed and were otherwise negative with the exception of those mentioned in the HPI and as above.  Physical Exam: Vitals:   04/23/23 0341 04/23/23 0724  BP: 125/77 113/76  Pulse: 73 (!) 57  Resp: 20 18  Temp: 98.2 F (36.8 C) (!) 97.5 F (36.4 C)  SpO2: 98% 95%    Body mass index is 31.95 kg/m.  General: Alert, no acute distress Cardiovascular: No pedal edema Respiratory: No cyanosis, no use of accessory musculature Skin: No lesions in the area of chief complaint Neurologic: Sensation intact distally Psychiatric: Patient is competent for consent with normal mood and affect Lymphatic: No axillary or cervical lymphadenopathy  MUSCULOSKELETAL: Patient continues to be in quite significant distress.  He continues to have weakness to hip flexion on the right.  Assessment/Plan: Right-sided L1 and L2 radiculopathy due to a large acute right-sided L1-2 disc herniation, involving  the right lateral recess, foramen, and extraforaminal regions. Plan for Procedure(s): LUMBAR ONE-TWO DECOMPRESSION AND TRANSFORAMINAL LUMBAR INTERBODY FUSION , with instrumentation and allograft.   Jackelyn Hoehn, MD 04/23/2023 9:57 AM

## 2023-04-23 NOTE — Transfer of Care (Signed)
Immediate Anesthesia Transfer of Care Note  Patient: Donalda Ewings  Procedure(s) Performed: LUMBAR ONE-TWO TRANSFORAMINAL LUMBAR INTERBODY FUSION (TLIF) (Right)  Patient Location: PACU  Anesthesia Type:General  Level of Consciousness: awake, alert , oriented, and patient cooperative  Airway & Oxygen Therapy: Patient Spontanous Breathing and Patient connected to face mask oxygen  Post-op Assessment: Report given to RN, Post -op Vital signs reviewed and stable, and Patient moving all extremities X 4  Post vital signs: Reviewed and stable  Last Vitals:  Vitals Value Taken Time  BP 131/71 04/23/23 1935  Temp    Pulse 86 04/23/23 1936  Resp    SpO2 95 % 04/23/23 1936  Vitals shown include unvalidated device data.  Last Pain:  Vitals:   04/23/23 1339  TempSrc:   PainSc: 3       Patients Stated Pain Goal: 2 (04/23/23 0800)  Complications: No notable events documented.

## 2023-04-23 NOTE — Progress Notes (Signed)
[facility] Progress Note Patient: Lee Mclaughlin:[id number] DOB: [birth year] DOA: 04/20/2023  DOS: the patient was seen and examined on 04/23/2023  Brief hospital course: PMH of HTN, HLD, CAD SP CABG, atrial flutter SP ablation, obesity presented to hospital with complaints of back pain.  MRI lumbar spine showed L1-L2 disc extrusion with severe canal stenosis.  Orthopedic was consulted.  Dr. Yevette Edwards recommending L1/2 decompression and transforaminal lumbar interbody fusion.  Originally came to [facility] and transferred to [facility] for surgical intervention. Scheduled for surgery on 5/16. Assessment and Plan: Lumbar spinal stenosis with radiculopathy Patient presenting to ED with progressive low back pain x 2 weeks now with radiculopathy.  MR L-spine with multifactorial degenerative changes including a moderate-sized right foraminal to subarticular disc extrusion at L1-2, with resultant moderate to severe canal with right greater than left lateral recess stenosis with severe right and moderate left L1 foraminal narrowing, disc bulge with facet hypertrophy at L2-3 with resultant mid to moderate spinal stenosis, with mild bilateral L2 foraminal narrowing, noted prior PLIF at L3-4 and L4-5.  Orthopedics following, appreciate assistance Currently recommending surgical intervention.  Scheduled for surgery on 5/16. Weightbearing, DVT prophylaxis, Pain control per surgery. Patient does not appear to have any prohibitive risk for the surgery.  Has tolerated similar surgeries in the past. Currently on gabapentin, Dilaudid scheduled every 4 hours and as needed as well as oxycodone.  Essential hypertension Blood pressure stable. Continuing carvedilol and irbesartan.   CAD s/p CABG Does not have any complaints of angina. Not on aspirin or statin outpatient.  Was cleared by cardiology in November 2023 for colonoscopy.   HLD Not on medication outpatient, intolerant to statins    Obesity Body mass index is 32.61 kg/m.  Placing the patient at high risk for poor outcome.  Typical atrial flutter. SP ablation in 2019. Not on any anticoagulation. No further workup for now.  Subjective: Unchanged pain.  Had a BM.  No nausea no vomiting.  Awaiting for surgery.  Physical Exam: In mild distress.  No rash. S1-S2 present. Bowel sound present. Clear to auscultation. No edema. Alert awake and oriented x 3.  Data Reviewed: I have Reviewed nursing notes, Vitals, and Lab results. Since last encounter, pertinent lab results CBC and BMP   . I have ordered test including CBC and BMP  .   Disposition: Status is: Inpatient Remains inpatient appropriate because: Will be undergoing surgery.  Will monitor for postop recovery.  SCDs Start: 04/21/23 0535   Family Communication: Wife at bedside Level of care: Med-Surg   Vitals:   04/23/23 0341 04/23/23 0423 04/23/23 0724 04/23/23 1319  BP: 125/77  113/76 113/76  Pulse: 73  (!) 57 63  Resp: 20  18 18   Temp: 98.2 F (36.8 C)  (!) 97.5 F (36.4 C) 97.9 F (36.6 C)  TempSrc:   Oral Oral  SpO2: 98%  95% 95%  Weight:  95.3 kg  97.3 kg  Height:    5\' 8"  (1.727 m)     Author: Lynden Oxford, MD 04/23/2023 3:56 PM  Please look on [email or web address] to find out who is on call.

## 2023-04-23 NOTE — Plan of Care (Signed)

## 2023-04-23 NOTE — Op Note (Addendum)
PATIENT NAME: Lee Mclaughlin   MEDICAL RECORD NO.:   [id number]   DATE OF BIRTH: 16-May-1951   DATE OF PROCEDURE: 04/23/2023                               OPERATIVE REPORT     PREOPERATIVE DIAGNOSES: 1.  Right-sided lumbar radiculopathy (M54.16), manifesting as severe right inguinal pain, and thigh pain, and an inability to ambulate 2.  Acute, large, L1-2 disc herniation, severely compressing the right L1 and L2 nerves 3.  Severe spinal stenosis, L1-2   POSTOPERATIVE DIAGNOSES: 1.  Right-sided lumbar radiculopathy (M54.16), manifesting as severe right inguinal pain, and thigh pain, and an inability to ambulate 2.  Acute, large, L1-2 disc herniation, severely compressing the right L1 and L2 nerves 3.  Severe spinal stenosis, L1-2   PROCEDURES: 1. L1/2 decompression 2. Right-sided L1/2 transforaminal lumbar interbody fusion. 3. Left-sided L1/2 posterolateral fusion. 4. Insertion of interbody device x1 (Globus expandable intervertebral spacer). 5. Placement of segmental posterior instrumentation L4, L5 bilaterally  6. Use of local autograft. 7. Use of morselized allograft - Vivigen 8. Intraoperative use of fluoroscopy.   SURGEON:  Estill Bamberg, MD.   ASSISTANTJason Coop, PA-C.   ANESTHESIA:  General endotracheal anesthesia.   COMPLICATIONS:  None.   DISPOSITION:  Stable.   ESTIMATED BLOOD LOSS:  100cc   INDICATIONS FOR SURGERY:  Briefly,  Lee Mclaughlin is a pleasant 72 -year-old male, who did present to the emergency department, with severe and debilitating pain in his right inguinal region and right leg.  He did have significant weakness as well.  His pain and weakness did limit his ability to care for himself at home, and did limit his ability to ambulate. For this reason, he was admitted to the medicine service initially at [facility].  I did evaluate him, and it was clear that he was not able to provide basic care for himself at home, due to an MRI notable for a  very large disc herniation, severely compressing his spinal canal and right-sided nerve roots.  Given this, he was transferred to [facility] for surgical intervention.  At this point in time, his severe pain has been present for about 1 week.  I did specifically discuss with the patient the utility of an L1-2 decompression, including removal of his herniated disc fragments, and given the instability that would result, a right-sided L1-2 transforaminal lumbar interbody fusion, with instrumentation and allograft.  The patient was fully made aware of the risks associated with surgery, including infection, neurovascular injury, DVT, cerebrospinal fluid leak, bleeding, as well as the possibility of nonunion, as well as the possibility of ongoing pain and weakness.  Given the patient's debilitating pain and weakness, he did wish to proceed with surgery.   OPERATIVE DETAILS:  On 04/23/2023, the patient was brought to surgery and general endotracheal anesthesia was administered.  The patient was placed prone on a well-padded flat Jackson bed with a spinal frame.  Antibiotics were given and a time-out procedure was performed. The back was prepped and draped in the usual fashion.  A midline incision was made overlying the L1/2 intervertebral spaces.  The fascia was incised at the midline.  The paraspinal musculature was bluntly swept laterally.  Anatomic landmarks for the pedicles were exposed. Using fluoroscopy, I did cannulate the L1 and L2 pedicles bilaterally, using a medial to lateral cortical trajectory technique.  At this  point, 6 mm screws were placed into the left pedicles, and a 45 mm rod was placed into the tulip heads of the screw, and caps were also placed.  Distraction was then applied across the L1/2 intervertebral space, and the caps were then provisionally tightened.  On the right side, bone wax was placed into the cannulated pedicle holes.  I then proceeded with the decompressive aspect of  the procedure at the L1/2 level.  A partial facetectomy was performed bilaterally at L1/2, partially decompressing the L1/2 intervertebral space.  Then, on the right side, with gentle medial retraction of the traversing right L2 nerve, multiple very large herniated disc fragments were encountered, and were removed uneventfully.  These fragments were both ventral and dorsal to the dura and traversing L2 nerve.  I was very pleased with the decompression. With an assistant holding medial retraction of the traversing right L2 nerve, I did perform an annulotomy at the posterolateral aspect of the L1/2 intervertebral space.  I then used a series of curettes and pituitary rongeurs to perform a thorough and complete intervertebral diskectomy.  The intervertebral space was then liberally packed with autograft as well as allograft in the form of Vivigen, as was the appropriate-sized intervertebral spacer.  The spacer was then tamped into position in the usual fashion, and expanded to 9.2 mm in height. I was very pleased with the press-fit of the spacer.  I then placed 6 mm screws on the right at L1 and L2. A 45 mm rod was then placed and caps were placed. The distraction was then released on the contralateral side.  All caps were then locked.  The wound was copiously irrigated with a total of approximately 3 L prior to placing the bone graft.  Additional autograft and allograft was then packed into the posterolateral gutter on the left side to help aid in the success of the fusion.  The wound was  explored for any undue bleeding and there was no substantial bleeding encountered.  Gel-Foam was placed over the laminectomy site.  The wound was then closed in layers using #1 Vicryl followed by 2-0 Vicryl, followed by 4-0 Monocryl.  Benzoin and Steri-Strips were applied followed by sterile dressing.     Of note, Jason Coop was my assistant throughout surgery, and did aid in retraction, suctioning, the  decompression, placement of the hardware, and closure.       Estill Bamberg, MD

## 2023-04-23 NOTE — Plan of Care (Signed)

## 2023-04-23 NOTE — Anesthesia Postprocedure Evaluation (Signed)
Anesthesia Post Note  Patient: Lee Mclaughlin  Procedure(s) Performed: LUMBAR ONE-TWO TRANSFORAMINAL LUMBAR INTERBODY FUSION (TLIF) (Right)     Patient location during evaluation: PACU Anesthesia Type: General Level of consciousness: awake and alert Pain management: pain level controlled Vital Signs Assessment: post-procedure vital signs reviewed and stable Respiratory status: spontaneous breathing, nonlabored ventilation, respiratory function stable and patient connected to nasal cannula oxygen Cardiovascular status: blood pressure returned to baseline and stable Postop Assessment: no apparent nausea or vomiting Anesthetic complications: no   No notable events documented.  Last Vitals:  Vitals:   04/23/23 2015 04/23/23 2025  BP: 119/71   Pulse: 74 64  Resp: 15 15  Temp: 36.8 C   SpO2: 93% 95%    Last Pain:  Vitals:   04/23/23 2025  TempSrc:   PainSc: 4     LLE Motor Response: Purposeful movement;Responds to commands (04/23/23 2015) LLE Sensation: Full sensation (04/23/23 2015) RLE Motor Response: Purposeful movement;Responds to commands (04/23/23 2015) RLE Sensation: Full sensation (04/23/23 2015)      Beryle Lathe

## 2023-04-24 DIAGNOSIS — M48061 Spinal stenosis, lumbar region without neurogenic claudication: Secondary | ICD-10-CM | POA: Diagnosis not present

## 2023-04-24 LAB — CBC
HCT: 45.3 % (ref 39.0–52.0)
Hemoglobin: 15.3 g/dL (ref 13.0–17.0)
MCH: 31.6 pg (ref 26.0–34.0)
MCHC: 33.8 g/dL (ref 30.0–36.0)
MCV: 93.6 fL (ref 80.0–100.0)
Platelets: 152 10*3/uL (ref 150–400)
RBC: 4.84 MIL/uL (ref 4.22–5.81)
RDW: 12.6 % (ref 11.5–15.5)
WBC: 10.2 10*3/uL (ref 4.0–10.5)
nRBC: 0 % (ref 0.0–0.2)

## 2023-04-24 LAB — BASIC METABOLIC PANEL
Anion gap: 10 (ref 5–15)
BUN: 18 mg/dL (ref 8–23)
CO2: 29 mmol/L (ref 22–32)
Calcium: 9.5 mg/dL (ref 8.9–10.3)
Chloride: 98 mmol/L (ref 98–111)
Creatinine, Ser: 1.06 mg/dL (ref 0.61–1.24)
GFR, Estimated: >60 mL/min (ref >60)
Glucose, Bld: 127 mg/dL — ABNORMAL HIGH (ref 70–99)
Potassium: 4 mmol/L (ref 3.5–5.1)
Sodium: 137 mmol/L (ref 135–145)

## 2023-04-24 MED ORDER — GABAPENTIN 300 MG PO CAPS
600.0000 mg | ORAL_CAPSULE | Freq: Three times a day (TID) | ORAL | Status: DC
  Administered 2023-04-24 – 2023-04-25 (×3): 600 mg via ORAL
  Filled 2023-04-24 (×3): qty 2

## 2023-04-24 MED ORDER — DULOXETINE HCL 30 MG PO CPEP: 60.0000 mg | ORAL_CAPSULE | Freq: Every day | ORAL | Status: DC

## 2023-04-24 MED ORDER — BISACODYL 10 MG RE SUPP: 10.0000 mg | Freq: Every day | RECTAL | Status: DC | PRN

## 2023-04-24 MED ORDER — MELOXICAM 7.5 MG PO TABS
15.0000 mg | ORAL_TABLET | Freq: Every day | ORAL | Status: DC
  Administered 2023-04-24: 15 mg via ORAL
  Filled 2023-04-24: qty 2

## 2023-04-24 MED ORDER — SIMETHICONE 80 MG PO CHEW
80.0000 mg | CHEWABLE_TABLET | Freq: Four times a day (QID) | ORAL | Status: DC
  Administered 2023-04-24 – 2023-04-25 (×4): 80 mg via ORAL
  Filled 2023-04-24 (×4): qty 1

## 2023-04-24 MED ORDER — DULOXETINE HCL 30 MG PO CPEP
30.0000 mg | ORAL_CAPSULE | Freq: Every day | ORAL | Status: DC
  Administered 2023-04-24: 30 mg via ORAL
  Filled 2023-04-24 (×2): qty 1

## 2023-04-24 MED ORDER — SENNOSIDES-DOCUSATE SODIUM 8.6-50 MG PO TABS
1.0000 | ORAL_TABLET | Freq: Two times a day (BID) | ORAL | Status: DC
  Administered 2023-04-24 – 2023-04-25 (×3): 1 via ORAL
  Filled 2023-04-24 (×3): qty 1

## 2023-04-24 MED ORDER — OXYCODONE HCL ER 10 MG PO T12A
10.0000 mg | EXTENDED_RELEASE_TABLET | Freq: Two times a day (BID) | ORAL | Status: DC
  Administered 2023-04-24 – 2023-04-25 (×2): 10 mg via ORAL
  Filled 2023-04-24 (×2): qty 1

## 2023-04-24 NOTE — Progress Notes (Signed)
[facility] Consultation Progress Note  Patient: Lee Mclaughlin ZOX:[id number]   PCP: Barbie Banner, MD DOB: 03/18/1951   DOA: 04/20/2023   DOS: 04/24/2023   Date of Service: the patient was seen and examined on 04/24/2023 Primary service: Estill Bamberg, MD   Brief hospital course: PMH of HTN, HLD, CAD SP CABG, atrial flutter SP ablation, obesity presented to hospital with complaints of back pain.  MRI lumbar spine showed L1-L2 disc extrusion with severe canal stenosis.  Orthopedic was consulted.  Dr. Yevette Edwards recommending L1/2 decompression and transforaminal lumbar interbody fusion.  Originally came to [facility] and transferred to [facility] for surgical intervention. Underwent surgery on 5/16.  Assessment and Plan: Lumbar spinal stenosis with radiculopathy Patient presenting to ED with progressive low back pain x 2 weeks now with radiculopathy.  MR L-spine with multifactorial degenerative changes including a moderate-sized right foraminal to subarticular disc extrusion at L1-2, with resultant moderate to severe canal with right greater than left lateral recess stenosis with severe right and moderate left L1 foraminal narrowing, disc bulge with facet hypertrophy at L2-3 with resultant mid to moderate spinal stenosis, with mild bilateral L2 foraminal narrowing, noted prior PLIF at L3-4 and L4-5.  Orthopedics following, appreciate assistance Currently recommending surgical intervention.  Scheduled for surgery on 5/16. Weightbearing, DVT prophylaxis, Pain control per surgery. Patient does not appear to have any prohibitive risk for the surgery.  Has tolerated similar surgeries in the past. I will increase his chronic gabapentin from 300-300-600 to 600 mg 3 times daily.   Essential hypertension Blood pressure stable. Continuing carvedilol and irbesartan.   CAD s/p CABG Does not have any complaints of angina. Not on aspirin or statin outpatient.  Was cleared by cardiology in November  2023 for colonoscopy.   HLD Not on medication outpatient, intolerant to statins   Obesity Body mass index is 33.02 kg/m.  Placing the patient at high risk for poor outcome.  Constipation. Will treat with bowel regimen.   Typical atrial flutter. SP ablation in 2019. Not on any anticoagulation. No further workup for now.  We will continue to follow the patient.   Subjective: Continues to report severe pain.  Also reports vomiting and nausea at that this morning.  Passing gas but no BM.  Objective: Vitals:   04/24/23 0342 04/24/23 0500 04/24/23 0748 04/24/23 1209  BP: 99/61  137/88 (!) 146/77  Pulse: 86  91 98  Resp: 20  20 20   Temp: 98.4 F (36.9 C)  98.8 F (37.1 C) 99 F (37.2 C)  TempSrc: Oral  Oral Oral  SpO2: 100%  97% 99%  Weight:  98.5 kg    Height:      Clear to auscultation. S1-S2 present. Bowel sound present.  Distended but no tenderness. No edema.  Family Communication: No one at bedside  Data Reviewed: Since last encounter, pertinent lab results and BMP   .   Author: Lynden Oxford, MD 04/24/2023 4:20 PM  To reach On-call, see care teams to locate the attending and reach out to them via [email or web address]. If 7PM-7AM, please contact night-coverage If you still have difficulty reaching the attending provider, please page the [facility] (Director on Call) for [facility] on amion for assistance.

## 2023-04-24 NOTE — Progress Notes (Signed)
    Patient doing well  Denies leg pain   Physical Exam: Vitals:   04/23/23 2325 04/24/23 0342  BP: 136/71 99/61  Pulse: 67 86  Resp: 20 20  Temp: 98 F (36.7 C) 98.4 F (36.9 C)  SpO2: 98% 100%    Dressing in place NVI  POD #1 s/p L1/2 complex decompression and fusion, doing well  - up with PT/OT, encourage ambulation - Dilaudid for pain, Robaxin for muscle spasms - patient requesting to stay another day, which seems reasonable - likely d/c tomorrow

## 2023-04-24 NOTE — Progress Notes (Signed)
Doctor's office called staff to notified patient on the new regimen on taking pain medication. As follows:( Dilaudid tablet every 4hrs for pain, dialaudid IV every 2hrs for breakthrough pain and new order for Oxycontin tablet for extended relieve pain medication as ordered. RN informed and educate patient with the stated understanding from patient.

## 2023-04-24 NOTE — Progress Notes (Signed)
Patient alert and oriented,void, surgical site clean and dry. Patient transfer to 5N19 per order. Report given to 5N nurse.

## 2023-04-24 NOTE — Progress Notes (Signed)
Patient is calling / taking pain medicine every two hours. MD aware. Patient said he is not going home until he is pain free. Staff educated the patient importance of ambulation after surgery, and patient stated " I am in too much pain to move around and this is not my first surgery, I know what to do". RN will continue to monitor patient status.

## 2023-04-24 NOTE — Evaluation (Signed)
Occupational Therapy Evaluation Patient Details Name: Lee Mclaughlin MRN: [id number] DOB: 1951/09/15 Today's Date: 04/24/2023   History of Present Illness Pt is a 72 y.o. male s/p L1/2 complex decompression and fusion. PMH: CAD, MI, c spine sx, CABG x 6, R TKR, R THR, multiple back sxs   Clinical Impression   Patient admitted for the diagnosis and procedure above.  Patient unfortunately with nausea to the point of vomiting.  RN notified and medication given.  Patient's biggest deficit is pain, he will have his spouse at home to assist as needed, and OT will return the next date to fully assess ADL abilities, and assist with any recommendations needed.  No post acute Ot is anticipated.       Recommendations for follow up therapy are one component of a multi-disciplinary discharge planning process, led by the attending physician.  Recommendations may be updated based on patient status, additional functional criteria and insurance authorization.   Assistance Recommended at Discharge Intermittent Supervision/Assistance  Patient can return home with the following Assist for transportation;A little help with bathing/dressing/bathroom    Functional Status Assessment  Patient has had a recent decline in their functional status and demonstrates the ability to make significant improvements in function in a reasonable and predictable amount of time.  Equipment Recommendations  None recommended by OT    Recommendations for Other Services       Precautions / Restrictions Precautions Precautions: Back Precaution Comments: Educated on 3/3 back precautions. Handout provided. Required Braces or Orthoses: Spinal Brace Spinal Brace: Thoracolumbosacral orthotic Restrictions Weight Bearing Restrictions: No      Mobility Bed Mobility Overal bed mobility: Needs Assistance Bed Mobility: Sit to Sidelying         Sit to sidelying: Min assist      Transfers Overall transfer level: Needs  assistance Equipment used: Rolling walker (2 wheels) Transfers: Sit to/from Stand Sit to Stand: Supervision                  Balance Overall balance assessment: Mild deficits observed, not formally tested                                         ADL either performed or assessed with clinical judgement   ADL       Grooming: Wash/dry hands;Wash/dry face;Set up;Sitting               Lower Body Dressing: Moderate assistance;Sit to/from stand   Toilet Transfer: Supervision/safety;Rolling walker (2 wheels)                   Vision Patient Visual Report: No change from baseline       Perception     Praxis      Pertinent Vitals/Pain Pain Assessment Pain Assessment: Faces Faces Pain Scale: Hurts even more Pain Location: R leg and incisional Pain Descriptors / Indicators: Tender, Discomfort, Grimacing, Guarding, Sharp Pain Intervention(s): Monitored during session     Hand Dominance Right   Extremity/Trunk Assessment Upper Extremity Assessment Upper Extremity Assessment: Overall WFL for tasks assessed   Lower Extremity Assessment Lower Extremity Assessment: Defer to PT evaluation   Cervical / Trunk Assessment Cervical / Trunk Assessment: Back Surgery   Communication Communication Communication: No difficulties   Cognition Arousal/Alertness: Awake/alert Behavior During Therapy: WFL for tasks assessed/performed Overall Cognitive Status: Within Functional Limits for tasks assessed  General Comments  VSS on RA    Exercises     Shoulder Instructions      Home Living Family/patient expects to be discharged to:: Private residence Living Arrangements: Spouse/significant other Available Help at Discharge: Family;Available 24 hours/day Type of Home: House Home Access: Level entry     Home Layout: Two level;Able to live on main level with bedroom/bathroom     Bathroom  Shower/Tub: Producer, television/film/video: Handicapped height Bathroom Accessibility: Yes How Accessible: Accessible via walker Home Equipment: Crutches;Rolling Walker (2 wheels);Hand held shower head;Shower seat - built in          Prior Functioning/Environment Prior Level of Function : Independent/Modified Independent             Mobility Comments: no AD at baseline ADLs Comments: independent with ADL and iADL.        OT Problem List: Pain      OT Treatment/Interventions: Self-care/ADL training;Therapeutic activities;Patient/family education;Balance training;DME and/or AE instruction    OT Goals(Current goals can be found in the care plan section) Acute Rehab OT Goals Patient Stated Goal: Return home over the weekend OT Goal Formulation: With patient Time For Goal Achievement: 05/08/23 Potential to Achieve Goals: Good ADL Goals Pt Will Perform Grooming: with modified independence;standing Pt Will Perform Lower Body Dressing: with modified independence;sit to/from stand Pt Will Transfer to Toilet: with modified independence;ambulating;regular height toilet  OT Frequency: Min 2X/week    Co-evaluation              AM-PAC OT "6 Clicks" Daily Activity     Outcome Measure Help from another person eating meals?: None Help from another person taking care of personal grooming?: None Help from another person toileting, which includes using toliet, bedpan, or urinal?: A Little Help from another person bathing (including washing, rinsing, drying)?: A Lot Help from another person to put on and taking off regular upper body clothing?: A Little Help from another person to put on and taking off regular lower body clothing?: A Lot 6 Click Score: 18   End of Session Equipment Utilized During Treatment: Rolling walker (2 wheels) Nurse Communication: Mobility status  Activity Tolerance: Patient tolerated treatment well Patient left: in bed;with call bell/phone within  reach  OT Visit Diagnosis: Unsteadiness on feet (R26.81)                Time: 8295-6213 OT Time Calculation (min): 20 min Charges:  OT General Charges $OT Visit: 1 Visit OT Evaluation $OT Eval Moderate Complexity: 1 Mod  04/24/2023  RP, OTR/L  Acute Rehabilitation Services  Office:  [phone]   Suzanna Obey 04/24/2023, 10:36 AM

## 2023-04-24 NOTE — Evaluation (Signed)
Physical Therapy Evaluation Patient Details Name: Lee Mclaughlin MRN: [id number] DOB: [birth year] Today's Date: 04/24/2023  History of Present Illness  Pt is a 72 y.o. male s/p L1/2 complex decompression and fusion. PMH: CAD, MI, c spine sx, CABG x 6, R TKR, R THR, multiple back sxs   Clinical Impression  Patient is s/p above surgery resulting in the deficits listed below (see PT Problem List). PTA pt lived at home with his wife, independent. On eval, he required supervision transfers, and supervision amb 350' with RW. Educated pt on 3/3 back precautions. Handout provided. Patient will benefit from acute skilled PT to increase their independence and safety with mobility (while adhering to their precautions) to allow discharge .        Recommendations for follow up therapy are one component of a multi-disciplinary discharge planning process, led by the attending physician.  Recommendations may be updated based on patient status, additional functional criteria and insurance authorization.  Follow Up Recommendations       Assistance Recommended at Discharge PRN  Patient can return home with the following  A little help with bathing/dressing/bathroom;Assistance with cooking/housework;Assist for transportation    Equipment Recommendations None recommended by PT  Recommendations for Other Services       Functional Status Assessment Patient has had a recent decline in their functional status and demonstrates the ability to make significant improvements in function in a reasonable and predictable amount of time.     Precautions / Restrictions Precautions Precautions: Back Precaution Comments: Educated on 3/3 back precautions. Handout provided. Required Braces or Orthoses: Spinal Brace Spinal Brace: Thoracolumbosacral orthotic;Applied in sitting position      Mobility  Bed Mobility Overal bed mobility: Modified Independent             General bed mobility comments: heavy reliance  on bed rail, increased time    Transfers Overall transfer level: Needs assistance Equipment used: Rolling walker (2 wheels) Transfers: Sit to/from Stand Sit to Stand: Supervision           General transfer comment: increased time    Ambulation/Gait Ambulation/Gait assistance: Supervision Gait Distance (Feet): 350 Feet Assistive device: Rolling walker (2 wheels) Gait Pattern/deviations: Step-through pattern, Decreased stride length Gait velocity: decreased Gait velocity interpretation: 1.31 - 2.62 ft/sec, indicative of limited community ambulator   General Gait Details: cues for posture  Stairs            Wheelchair Mobility    Modified Rankin (Stroke Patients Only)       Balance Overall balance assessment: Mild deficits observed, not formally tested                                           Pertinent Vitals/Pain Pain Assessment Pain Assessment: No/denies pain (premedicated with IV pain meds)    Home Living Family/patient expects to be discharged to:: Private residence Living Arrangements: Spouse/significant other Available Help at Discharge: Family;Available 24 hours/day Type of Home: House Home Access: Level entry       Home Layout: Two level;Able to live on main level with bedroom/bathroom Home Equipment: Crutches;Rolling Walker (2 wheels);Hand held shower head;Shower seat - built in      Prior Function Prior Level of Function : Independent/Modified Independent             Mobility Comments: no AD at baseline ADLs Comments: independent     Hand  Dominance   Dominant Hand: Right    Extremity/Trunk Assessment   Upper Extremity Assessment Upper Extremity Assessment: Defer to OT evaluation    Lower Extremity Assessment Lower Extremity Assessment: Overall WFL for tasks assessed    Cervical / Trunk Assessment Cervical / Trunk Assessment: Back Surgery  Communication   Communication: No difficulties  Cognition  Arousal/Alertness: Awake/alert Behavior During Therapy: WFL for tasks assessed/performed Overall Cognitive Status: Within Functional Limits for tasks assessed                                          General Comments General comments (skin integrity, edema, etc.): Pt reporting nausea at end of session.    Exercises     Assessment/Plan    PT Assessment Patient needs continued PT services  PT Problem List Decreased knowledge of precautions;Decreased mobility;Decreased activity tolerance       PT Treatment Interventions Functional mobility training;DME instruction;Balance training;Patient/family education;Gait training;Therapeutic activities;Stair training    PT Goals (Current goals can be found in the Care Plan section)  Acute Rehab PT Goals Patient Stated Goal: home, decrease pain PT Goal Formulation: With patient Time For Goal Achievement: 05/01/23 Potential to Achieve Goals: Good    Frequency Min 5X/week     Co-evaluation               AM-PAC PT "6 Clicks" Mobility  Outcome Measure Help needed turning from your back to your side while in a flat bed without using bedrails?: None Help needed moving from lying on your back to sitting on the side of a flat bed without using bedrails?: A Little Help needed moving to and from a bed to a chair (including a wheelchair)?: A Little Help needed standing up from a chair using your arms (e.g., wheelchair or bedside chair)?: A Little Help needed to walk in hospital room?: A Little Help needed climbing 3-5 steps with a railing? : A Little 6 Click Score: 19    End of Session Equipment Utilized During Treatment: Gait belt;Back brace Activity Tolerance: Patient tolerated treatment well Patient left: in bed;with call bell/phone within reach (sitting EOB) Nurse Communication: Mobility status PT Visit Diagnosis: Difficulty in walking, not elsewhere classified (R26.2)    Time: 1610-9604 PT Time Calculation (min)  (ACUTE ONLY): 24 min   Charges:   PT Evaluation $PT Eval Moderate Complexity: 1 Mod PT Treatments $Gait Training: 8-22 mins        Ferd Glassing., PT  Office # [phone]   Ilda Foil 04/24/2023, 10:16 AM

## 2023-04-25 DIAGNOSIS — M48061 Spinal stenosis, lumbar region without neurogenic claudication: Secondary | ICD-10-CM | POA: Diagnosis not present

## 2023-04-25 LAB — CBC
HCT: 39.8 % (ref 39.0–52.0)
Hemoglobin: 13.9 g/dL (ref 13.0–17.0)
MCH: 32.3 pg (ref 26.0–34.0)
MCHC: 34.9 g/dL (ref 30.0–36.0)
MCV: 92.6 fL (ref 80.0–100.0)
Platelets: 135 10*3/uL — ABNORMAL LOW (ref 150–400)
RBC: 4.3 MIL/uL (ref 4.22–5.81)
RDW: 12.7 % (ref 11.5–15.5)
WBC: 11 10*3/uL — ABNORMAL HIGH (ref 4.0–10.5)
nRBC: 0 % (ref 0.0–0.2)

## 2023-04-25 LAB — BASIC METABOLIC PANEL
Anion gap: 9 (ref 5–15)
BUN: 18 mg/dL (ref 8–23)
CO2: 26 mmol/L (ref 22–32)
Calcium: 9.2 mg/dL (ref 8.9–10.3)
Chloride: 100 mmol/L (ref 98–111)
Creatinine, Ser: 1.32 mg/dL — ABNORMAL HIGH (ref 0.61–1.24)
GFR, Estimated: 58 mL/min — ABNORMAL LOW (ref >60)
Glucose, Bld: 122 mg/dL — ABNORMAL HIGH (ref 70–99)
Potassium: 3.7 mmol/L (ref 3.5–5.1)
Sodium: 135 mmol/L (ref 135–145)

## 2023-04-25 MED ORDER — SODIUM CHLORIDE 0.9 % IV BOLUS: 1000.0000 mL | Freq: Once | INTRAVENOUS | Status: DC

## 2023-04-25 MED ORDER — OXYCODONE HCL ER 10 MG PO T12A: 10.0000 mg | EXTENDED_RELEASE_TABLET | Freq: Two times a day (BID) | ORAL | 0 refills | Status: AC

## 2023-04-25 MED ORDER — ONDANSETRON HCL 4 MG PO TABS: 4.0000 mg | ORAL_TABLET | Freq: Four times a day (QID) | ORAL | 0 refills | Status: DC | PRN

## 2023-04-25 MED ORDER — SENNOSIDES-DOCUSATE SODIUM 8.6-50 MG PO TABS: 1.0000 | ORAL_TABLET | Freq: Two times a day (BID) | ORAL | 0 refills | Status: DC

## 2023-04-25 MED ORDER — METHOCARBAMOL 500 MG PO TABS: 500.0000 mg | ORAL_TABLET | Freq: Four times a day (QID) | ORAL | 0 refills | Status: AC | PRN

## 2023-04-25 MED ORDER — CARVEDILOL 12.5 MG PO TABS: 6.2500 mg | ORAL_TABLET | Freq: Two times a day (BID) | ORAL | Status: DC

## 2023-04-25 MED ORDER — HYDROMORPHONE HCL 2 MG PO TABS: 2.0000 mg | ORAL_TABLET | Freq: Four times a day (QID) | ORAL | 0 refills | Status: AC | PRN

## 2023-04-25 MED ORDER — CARVEDILOL 12.5 MG PO TABS: 12.5000 mg | ORAL_TABLET | Freq: Two times a day (BID) | ORAL | Status: AC

## 2023-04-25 MED ORDER — CARVEDILOL 6.25 MG PO TABS: 6.2500 mg | ORAL_TABLET | Freq: Two times a day (BID) | ORAL | Status: DC

## 2023-04-25 NOTE — Progress Notes (Signed)
Discharge Instruction Patient alert and oriented, was able to verbalize back the discharge instruction. PIV discontinued and transported safely to his ride by his Nurse tech.

## 2023-04-25 NOTE — Progress Notes (Signed)
Occupational Therapy Treatment Patient Details Name: Lee Mclaughlin MRN: [id number] DOB: 1950-12-14 Today's Date: 04/25/2023   History of present illness Pt is a 72 y.o. male s/p L1/2 complex decompression and fusion. PMH: CAD, MI, c spine sx, CABG x 6, R TKR, R THR, multiple back sxs   OT comments  Patient stating he feels better today with less pain.  He is moving much better, and is essentially Ind in the room without an assistive device.  He is still needing assist reaching his feet, but is not interested in adaptive equipment.  The patient will have assist as needed from spouse.  Patient continues to demonstrate good follow through with back precautions, and no further OT needs exist in the acute setting.  Patient plans on returning home later this afternoon.  Recommend follow up as prescribed by the MD.     Recommendations for follow up therapy are one component of a multi-disciplinary discharge planning process, led by the attending physician.  Recommendations may be updated based on patient status, additional functional criteria and insurance authorization.    Assistance Recommended at Discharge Set up Supervision/Assistance  Patient can return home with the following  Assist for transportation   Equipment Recommendations  None recommended by OT    Recommendations for Other Services      Precautions / Restrictions Precautions Precautions: Back Required Braces or Orthoses: Spinal Brace Spinal Brace: Thoracolumbosacral orthotic Restrictions Weight Bearing Restrictions: No       Mobility Bed Mobility               General bed mobility comments: up in recliner    Transfers Overall transfer level: Modified independent Equipment used: Rolling walker (2 wheels) Transfers: Sit to/from Stand, Bed to chair/wheelchair/BSC Sit to Stand: Modified independent (Device/Increase time)     Step pivot transfers: Modified independent (Device/Increase time)           Balance  Overall balance assessment: Mild deficits observed, not formally tested                                         ADL either performed or assessed with clinical judgement   ADL       Grooming: Wash/dry hands;Wash/dry face;Modified independent;Standing               Lower Body Dressing: Sit to/from stand;Minimal assistance   Toilet Transfer: Rolling walker (2 wheels);Modified Independent;Regular Toilet                  Extremity/Trunk Assessment Upper Extremity Assessment Upper Extremity Assessment: Overall WFL for tasks assessed   Lower Extremity Assessment Lower Extremity Assessment: Defer to PT evaluation   Cervical / Trunk Assessment Cervical / Trunk Assessment: Back Surgery                      Cognition Arousal/Alertness: Awake/alert Behavior During Therapy: WFL for tasks assessed/performed Overall Cognitive Status: Within Functional Limits for tasks assessed                                                       General Comments Able to walk in the room without RW    Pertinent Vitals/ Pain  Pain Assessment Pain Assessment: Faces Faces Pain Scale: Hurts little more Pain Location: R leg and incisional Pain Descriptors / Indicators: Tender, Grimacing Pain Intervention(s): Monitored during session                                                          Frequency           Progress Toward Goals  OT Goals(current goals can now be found in the care plan section)  Progress towards OT goals: Goals met/education completed, patient discharged from OT     Plan All goals met and education completed, patient discharged from OT services    Co-evaluation                 AM-PAC OT "6 Clicks" Daily Activity     Outcome Measure   Help from another person eating meals?: None Help from another person taking care of personal grooming?: None Help from another person  toileting, which includes using toliet, bedpan, or urinal?: None Help from another person bathing (including washing, rinsing, drying)?: A Little Help from another person to put on and taking off regular upper body clothing?: None Help from another person to put on and taking off regular lower body clothing?: A Little 6 Click Score: 22    End of Session Equipment Utilized During Treatment: Rolling walker (2 wheels)      Activity Tolerance Patient tolerated treatment well   Patient Left in chair;with call bell/phone within reach   Nurse Communication Mobility status        Time: 1610-9604 OT Time Calculation (min): 18 min  Charges: OT General Charges $OT Visit: 1 Visit OT Treatments $Self Care/Home Management : 8-22 mins  04/25/2023  RP, OTR/L  Acute Rehabilitation Services  Office:  [phone]   Suzanna Obey 04/25/2023, 10:06 AM

## 2023-04-25 NOTE — Progress Notes (Signed)
Physical Therapy Treatment Patient Details Name: Lee Mclaughlin MRN: [id number] DOB: August 29, 1951 Today's Date: 04/25/2023   History of Present Illness Pt is a 72 y.o. male s/p L1/2 complex decompression and fusion. PMH: CAD, MI, c spine sx, CABG x 6, R TKR, R THR, multiple back sxs    PT Comments    Pt seated in recliner.  He is very talkative but reports he feels much better today and prepared to go home.   Reviewed gait and stairs training this pm and he responded well.  He continues to complain of pain in R knee ( nerve pain ).  He is mobilizing well and ready to d/c from a mobility stand point.      Recommendations for follow up therapy are one component of a multi-disciplinary discharge planning process, led by the attending physician.  Recommendations may be updated based on patient status, additional functional criteria and insurance authorization.  Follow Up Recommendations       Assistance Recommended at Discharge    Patient can return home with the following A little help with bathing/dressing/bathroom;Assistance with cooking/housework;Assist for transportation   Equipment Recommendations  None recommended by PT    Recommendations for Other Services       Precautions / Restrictions Precautions Precautions: Back Precaution Comments: Educated on 3/3 back precautions. Handout provided. Required Braces or Orthoses: Spinal Brace Spinal Brace: Thoracolumbosacral orthotic Restrictions Weight Bearing Restrictions: No     Mobility  Bed Mobility               General bed mobility comments: up in recliner    Transfers Overall transfer level: Needs assistance Equipment used: Rolling walker (2 wheels) Transfers: Sit to/from Stand Sit to Stand: Modified independent (Device/Increase time)           General transfer comment: cues for forward advance of RLE to reduce pain during transitions.    Ambulation/Gait Ambulation/Gait assistance: Modified independent  (Device/Increase time) Gait Distance (Feet): 425 Feet Assistive device: Rolling walker (2 wheels) Gait Pattern/deviations: Step-through pattern, Decreased stride length Gait velocity: decreased     General Gait Details: cues for posture   Stairs Stairs: Yes Stairs assistance: Modified independent (Device/Increase time) Stair Management: One rail Right Number of Stairs: 6 General stair comments: Cues for sequencing and safety this session.   Wheelchair Mobility    Modified Rankin (Stroke Patients Only)       Balance Overall balance assessment: Mild deficits observed, not formally tested                                          Cognition Arousal/Alertness: Awake/alert Behavior During Therapy: WFL for tasks assessed/performed Overall Cognitive Status: Within Functional Limits for tasks assessed                                          Exercises      General Comments General comments (skin integrity, edema, etc.): Able to walk in the room without RW      Pertinent Vitals/Pain Pain Assessment Pain Assessment: Faces Pain Score: 3  Faces Pain Scale: Hurts little more Pain Location: R leg and incisional Pain Descriptors / Indicators: Tender, Grimacing Pain Intervention(s): Monitored during session, Repositioned    Home Living  Prior Function            PT Goals (current goals can now be found in the care plan section) Acute Rehab PT Goals Potential to Achieve Goals: Good Progress towards PT goals: Progressing toward goals    Frequency    Min 5X/week      PT Plan Current plan remains appropriate    Co-evaluation              AM-PAC PT "6 Clicks" Mobility   Outcome Measure  Help needed turning from your back to your side while in a flat bed without using bedrails?: None Help needed moving from lying on your back to sitting on the side of a flat bed without using bedrails?:  None Help needed moving to and from a bed to a chair (including a wheelchair)?: None Help needed standing up from a chair using your arms (e.g., wheelchair or bedside chair)?: None Help needed to walk in hospital room?: A Little Help needed climbing 3-5 steps with a railing? : A Little 6 Click Score: 22    End of Session Equipment Utilized During Treatment: Gait belt;Back brace Activity Tolerance: Patient tolerated treatment well Patient left: in chair Nurse Communication: Mobility status PT Visit Diagnosis: Difficulty in walking, not elsewhere classified (R26.2)     Time: 5784-6962 PT Time Calculation (min) (ACUTE ONLY): 23 min  Charges:  $Gait Training: 8-22 mins $Therapeutic Activity: 8-22 mins                     Bonney Leitz , PTA Acute Rehabilitation Services Office [phone]    Florestine Avers 04/25/2023, 11:03 AM

## 2023-04-25 NOTE — Plan of Care (Signed)
  Problem: Health Behavior/Discharge Planning: Goal: Ability to manage health-related needs will improve Outcome: Progressing   Problem: Clinical Measurements: Goal: Will remain free from infection Outcome: Progressing   Problem: Nutrition: Goal: Adequate nutrition will be maintained Outcome: Progressing   Problem: Elimination: Goal: Will not experience complications related to bowel motility Outcome: Progressing   

## 2023-04-25 NOTE — Progress Notes (Signed)
[facility] PROGRESS NOTE  Patient: Lee Mclaughlin GLO:[id number]   PCP: Barbie Banner, MD DOB: 12/08/1951   DOA: 04/20/2023   DOS: 04/25/2023    Subjective: Patient was seen after working with physical therapy.  Denies any acute complaints.  Pain well-controlled.  No nausea no vomiting.  Passing gas.  No dizziness no lightheadedness.  In good spirits.  Objective:  Vitals:   04/25/23 0307 04/25/23 0454 04/25/23 0751 04/25/23 0811  BP: (!) 91/57 (!) 93/53 91/63   Pulse: 70  73   Resp: 20  17   Temp: 98 F (36.7 C)  98.4 F (36.9 C)   TempSrc: Oral  Oral   SpO2: 97%  92% 95%  Weight:  97.4 kg    Height:       Clear to auscultation. S1-S2 present. Blood pressure checked personally was 100+ systolic Assessment and plan: Essential hypertension. Currently in the setting of soft blood pressure would recommend to stop Benicar. Continue Coreg.  Mild renal dysfunction. Would recommend to drink oral hydration. Would recommend to hold Benicar. Recommend to recheck BMP in 1 week.  Author: Lynden Oxford, MD [facility] 04/25/2023    If 7PM-7AM, please contact night-coverage at [email or web address]

## 2023-04-25 NOTE — Progress Notes (Signed)
    Patient doing well PO day 2 S/P L1-2 complex decompression and fusion. Pt feels he "has turned the corner" and is doing much better this morning. He got up and out of bed to his chair by himself and is pleased with his progress and pain control at this point. He states he would like to get PT today and if they clear him he would go home today vs tomorrow if they think he needs more PT. Eating and drinking well, has had BM and +passing gas   Physical Exam: Vitals:   04/25/23 0454 04/25/23 0751  BP: (!) 93/53 91/63  Pulse:  73  Resp:  17  Temp:  98.4 F (36.9 C)  SpO2:  92%    Dressing in place, CDI, pt sitting uup very comfortably in chair. NVI  POD #2 s/p L1-2 complex decompression and fusion.  - up with PT/OT, encourage ambulation - Dilaudid for pain, Robaxin for muscle spasms, oxycontin 10 Q12 added yesterday - likely d/c home today with f/u in 2 weeks

## 2023-04-27 ENCOUNTER — Other Ambulatory Visit: Payer: Self-pay

## 2023-04-28 ENCOUNTER — Encounter (HOSPITAL_COMMUNITY): Payer: Self-pay | Admitting: Orthopedic Surgery

## 2023-04-29 NOTE — Discharge Summary (Signed)
Patient ID: SAVYON MOCKLER MRN: [id number] DOB/AGE: 03/12/1951 72 y.o.  Admit date: 04/20/2023 Discharge date: 04/25/2023  Admission Diagnoses:  Principal Problem:   Lumbar spinal stenosis Active Problems:   CAD (coronary artery disease)   Dyslipidemia   Essential hypertension   Spinal stenosis of lumbar region with radiculopathy   Radiculopathy of lumbar region   Discharge Diagnoses:  Same  Past Medical History:  Diagnosis Date   Coronary artery disease    COVID-19    In december 2020   Dyslipidemia    Dysrhythmia    a flutter   GERD (gastroesophageal reflux disease)    H/O cardiovascular stress test 10/19/2012   MET test - good effort, peak VO2 of almost 112% predicted, HR 79% predicted, mildly ischemia at end of exercise   History of hyperthyroidism as a child    History of nuclear stress test 03/2011   bruce myoview; mild perfusion defect in mid anteroseptal, apical septal, apical regions (infarct/scar/breast attenuation); post-stress EF 55%; no signficant ischemia   Ischemic cardiomyopathy    history of    Myocardial infarction (HCC)    Pneumonia    as a kid   PONV (postoperative nausea and vomiting)    Primary localized osteoarthritis of right hip 05/24/2019   Primary localized osteoarthritis of right knee    Restless leg    right total hip arthroplasty with dislocation of hip (HCC) 08/14/2019   S/P CABG (coronary artery bypass graft) 2000   Staphylococcal arthritis of right knee (HCC) 2004   Wears glasses     Surgeries: Procedure(s): LUMBAR ONE-TWO TRANSFORAMINAL LUMBAR INTERBODY FUSION (TLIF) on 04/23/2023   Consultants: Treatment Team:  Marcene Corning, MD  Discharged Condition: Improved  Hospital Course: Lee Mclaughlin is an 72 y.o. male who was admitted 04/20/2023 for operative treatment of Lumbar spinal stenosis. Patient has severe unremitting pain that affects sleep, daily activities, and work/hobbies. After pre-op clearance the patient was taken to  the operating room on 04/23/2023 and underwent  Procedure(s): LUMBAR ONE-TWO TRANSFORAMINAL LUMBAR INTERBODY FUSION (TLIF).    Patient was given perioperative antibiotics:  Anti-infectives (From admission, onward)    Start     Dose/Rate Route Frequency Ordered Stop   04/24/23 0000  ceFAZolin (ANCEF) IVPB 2g/100 mL premix        2 g 200 mL/hr over 30 Minutes Intravenous Every 8 hours 04/23/23 2012 04/24/23 0846        Patient was given sequential compression devices, early ambulation to prevent DVT.  Patient benefited maximally from hospital stay and there were no complications.    Recent vital signs: BP 91/63 (BP Location: Right Arm)   Pulse 73   Temp 98.4 F (36.9 C) (Oral)   Resp 17   Ht 5\' 8"  (1.727 m)   Wt 97.4 kg   SpO2 95%   BMI 32.65 kg/m    Discharge Medications:   Allergies as of 04/25/2023       Reactions   Morphine And Codeine Nausea And Vomiting   Crestor [rosuvastatin] Other (See Comments)   Myalgias   Vytorin [ezetimibe-simvastatin] Other (See Comments)   Myalgias        Medication List     STOP taking these medications    meloxicam 15 MG tablet Commonly known as: MOBIC   olmesartan 40 MG tablet Commonly known as: BENICAR   oxyCODONE 5 MG immediate release tablet Commonly known as: Roxicodone Replaced by: oxyCODONE 10 mg 12 hr tablet   oxyCODONE-acetaminophen 5-325 MG  tablet Commonly known as: PERCOCET/ROXICET       TAKE these medications    albuterol 108 (90 Base) MCG/ACT inhaler Commonly known as: VENTOLIN HFA Inhale 2 puffs into the lungs every 6 (six) hours as needed for wheezing or shortness of breath.   carvedilol 12.5 MG tablet Commonly known as: COREG Take 1 tablet (12.5 mg total) by mouth 2 (two) times daily with a meal.   DULoxetine 30 MG capsule Commonly known as: CYMBALTA Take 30 mg by mouth 2 (two) times daily.   gabapentin 600 MG tablet Commonly known as: NEURONTIN TAKE 1 TABLET BY MOUTH AT BEDTIME.    gabapentin 300 MG capsule Commonly known as: NEURONTIN Take 300 mg by mouth 2 (two) times daily.   HYDROmorphone 2 MG tablet Commonly known as: DILAUDID Take 1 tablet (2 mg total) by mouth every 6 (six) hours as needed for moderate pain.   lidocaine 5 % Commonly known as: Lidoderm Place 1 patch onto the skin daily as needed. Remove & Discard patch within 12 hours or as directed by MD   loratadine 10 MG tablet Commonly known as: CLARITIN Take 10 mg by mouth daily.   methocarbamol 500 MG tablet Commonly known as: ROBAXIN Take 1-2 tablets (500-1,000 mg total) by mouth every 6 (six) hours as needed for muscle spasms.   montelukast 10 MG tablet Commonly known as: SINGULAIR Take 10 mg by mouth in the morning.   ondansetron 4 MG tablet Commonly known as: ZOFRAN Take 1 tablet (4 mg total) by mouth every 6 (six) hours as needed for nausea or vomiting.   oxyCODONE 10 mg 12 hr tablet Commonly known as: OXYCONTIN Take 1 tablet (10 mg total) by mouth every 12 (twelve) hours for 7 days. Replaces: oxyCODONE 5 MG immediate release tablet   Repatha SureClick 140 MG/ML Soaj Generic drug: Evolocumab INJECT 140 MG INTO THE SKIN EVERY 14 (FOURTEEN) DAYS.   senna-docusate 8.6-50 MG tablet Commonly known as: Senokot-S Take 1 tablet by mouth 2 (two) times daily.        Diagnostic Studies: DG Lumbar Spine 2-3 Views  Result Date: 04/23/2023 CLINICAL DATA:  Elective surgery EXAM: LUMBAR SPINE - 2 VIEW COMPARISON:  04/20/2023 FINDINGS: L1-2 PLIF with uncomplicated hardware.  Pre-existing L3-L5 PLIF. IMPRESSION: L1-2 PLIF, uncomplicated. Electronically Signed   By: Tiburcio Pea M.D.   On: 04/23/2023 19:47   DG Lumbar Spine 1 View  Result Date: 04/23/2023 CLINICAL DATA:  Elective prominent EXAM: LUMBAR SPINE - 1 VIEW COMPARISON:  Lumbar MRI 04/20/2023 FINDINGS: Spinal needles project at the level of the T12 spinous process and at the L1-2 interspace. L3-L5 fusion. IMPRESSION: Intraoperative  localization as above. Electronically Signed   By: Tiburcio Pea M.D.   On: 04/23/2023 19:44   DG C-Arm 1-60 Min-No Report  Result Date: 04/23/2023 Fluoroscopy was utilized by the requesting physician.  No radiographic interpretation.   DG C-Arm 1-60 Min-No Report  Result Date: 04/23/2023 Fluoroscopy was utilized by the requesting physician.  No radiographic interpretation.   DG C-Arm 1-60 Min-No Report  Result Date: 04/23/2023 Fluoroscopy was utilized by the requesting physician.  No radiographic interpretation.   MR LUMBAR SPINE WO CONTRAST  Result Date: 04/20/2023 CLINICAL DATA:  Initial evaluation for acute lower back pain, myelopathy. EXAM: MRI LUMBAR SPINE WITHOUT CONTRAST TECHNIQUE: Multiplanar, multisequence MR imaging of the lumbar spine was performed. No intravenous contrast was administered. COMPARISON:  Prior MRI from 10/01/2015. FINDINGS: Segmentation: Standard. Lowest well-formed disc space labeled the L5-S1 level. Alignment:  Mild levoscoliosis. 3 mm degenerative retrolisthesis of L1 on L2. Chronic 3 mm anterolisthesis of L3 on L4 and L4 on L5. Vertebrae: Sequelae of prior PLIF at L3-4 and L4-5. Vertebral body height maintained without acute or chronic fracture. Bone marrow signal intensity within normal limits. No worrisome osseous lesions. Mild reactive endplate changes present about the L1-2 interspace. No other abnormal marrow edema. Conus medullaris and cauda equina: Conus extends to the L1 level. Conus within normal limits. Proximal nerve roots of the cauda equina are irregular due to compressive stenosis at the L1-2 level. Paraspinal and other soft tissues: Postoperative changes noted within the lower posterior paraspinous soft tissues. No collections. Few small simple left renal cyst noted, largest of which measures 1.7 cm. These are benign in appearance, with no follow-up imaging recommended. Disc levels: T12-L1: Degenerative intervertebral disc space narrowing with disc  desiccation and diffuse disc bulge. Associated reactive endplate spurring. Mild right greater left facet hypertrophy. No significant spinal stenosis. Foramina remain patent. L1-2: Retrolisthesis with degenerative intervertebral disc space narrowing. Diffuse disc bulge with disc desiccation and mild reactive endplate spurring. Superimposed right foraminal to subarticular disc extrusion with both superior and inferior migration (series 6, images 8, 7). Extruded disc material involves the right lateral epidural space, flattening the right aspect of the thecal sac. Superimposed moderate facet hypertrophy. Resultant moderate to severe canal with right greater than left lateral recess stenosis. Severe right with moderate left L1 foraminal narrowing. L2-3: Mild disc bulge with reactive endplate spurring. Moderate left worse than right facet hypertrophy. Resultant mild-to-moderate spinal stenosis. Mild bilateral L2 foraminal narrowing. L3-4: Prior PLIF. No significant residual spinal stenosis. No more than mild bilateral foraminal narrowing. L4-5: Prior PLIF. Residual right eccentric disc bulge with endplate spurring and facet hypertrophy with residual mild right lateral recess stenosis. Central canal remains patent. Moderate right with mild left foraminal stenosis. L5-S1: Disc desiccation with diffuse disc bulge and reactive endplate spurring. Superimposed small central disc protrusion minimally indents the ventral thecal sac. Mild facet hypertrophy. Mild epidural lipomatosis. No significant spinal stenosis. Foramina appear patent. IMPRESSION: 1. Multifactorial degenerative changes including a moderate-sized right foraminal to subarticular disc extrusion at L1-2. Resultant moderate to severe canal with right greater than left lateral recess stenosis, with severe right and moderate left L1 foraminal narrowing. 2. Disc bulge with facet hypertrophy at L2-3 with resultant mild to moderate spinal stenosis, with mild bilateral  L2 foraminal narrowing. 3. Prior PLIF at L3-4 and L4-5. Residual mild right lateral recess stenosis at L4-5 with moderate right foraminal narrowing. Electronically Signed   By: Rise Mu M.D.   On: 04/20/2023 22:47    Disposition: Discharge disposition: 01-Home or Self Care       Discharge Instructions     Discharge patient   Complete by: As directed    Pending PT/OT clearance today vs tomorrow   Discharge disposition: 01-Home or Self Care   Discharge patient date: 04/25/2023      OD #2 s/p L1-2 complex decompression and fusion.   - up with PT/OT, encourage ambulation - Dilaudid for pain, Robaxin for muscle spasms, oxycontin 10 Q12 added yesterday -Scripts for pain sent to pharmacy electronically  -D/C instructions sheet printed and in chart -D/C today  -F/U in office 2 weeks   Signed: Georga Bora 04/29/2023, 10:19 AM

## 2023-06-27 ENCOUNTER — Other Ambulatory Visit: Payer: Self-pay | Admitting: Internal Medicine

## 2023-09-22 ENCOUNTER — Encounter: Payer: Self-pay | Admitting: Pulmonary Disease

## 2023-09-22 ENCOUNTER — Ambulatory Visit: Payer: PPO | Admitting: Pulmonary Disease

## 2023-09-22 ENCOUNTER — Other Ambulatory Visit: Payer: Self-pay | Admitting: Pulmonary Disease

## 2023-09-22 VITALS — BP 90/70 | HR 71 | Ht 68.0 in | Wt 228.8 lb

## 2023-09-22 DIAGNOSIS — G478 Other sleep disorders: Secondary | ICD-10-CM

## 2023-09-22 DIAGNOSIS — G2581 Restless legs syndrome: Secondary | ICD-10-CM

## 2023-09-22 MED ORDER — BUPRENORPHINE HCL-NALOXONE HCL 2-0.5 MG SL FILM: 1.0000 | ORAL_FILM | Freq: Every evening | SUBLINGUAL | 0 refills | Status: AC

## 2023-09-22 NOTE — Patient Instructions (Signed)
I will see you in about 4 weeks  Trial with Suboxone film for uncontrolled restless legs in the context of chronic back pain, multiple surgeries  Continue using Neurontin and Requip  Goal will be if your symptoms are better with a trial with the Suboxone film, we may be able to wean down Neurontin and Requip  It does take some time to be able to figure out whether the medication will help If any significant intolerance to the Suboxone film-stop it  Contact us within a week of trial to see whether this helps you and we can continue, may need to escalate dose to help  We can consider a sleep study once you are able to get more consolidated sleep

## 2023-09-22 NOTE — Progress Notes (Unsigned)
Lee Mclaughlin    [id number]    09-06-1951  Primary Care Physician:Wilson, Gloriajean Dell, MD  Referring Physician: Barbie Banner, MD [address],  [place] [address]  Chief complaint:   Patient with a chronic history of restless leg syndrome In for follow-up today  HPI:  Escalating doses of Requip has not really helped symptoms Gabapentin was started during the last visit Noticed slight improvement in symptoms  Has weaned off Requip  Nonrestorative sleep  Sleep study a few years back was negative for significant sleep disordered breathing, did not sleep well at all Was significant for restless legs.    He does snore Still not sleeping through the night  Significant history of back pain and back surgeries, recently had in the last year over 9 surgeries as stated He does get injections in his back to control his chronic pain Now talking about surgery as his next option  Sleep habits have not changed much Usually goes to bed about 10 PM, falls asleep quickly Wakes up about every 2 hours Final wake up time about 6 AM  Weight has been relatively stable history of coronary artery disease, history of MI, history of cardiac dysrhythmia status post ablation  Outpatient Encounter Medications as of 09/22/2023  Medication Sig   albuterol (VENTOLIN HFA) 108 (90 Base) MCG/ACT inhaler Inhale 2 puffs into the lungs every 6 (six) hours as needed for wheezing or shortness of breath.   carvedilol (COREG) 12.5 MG tablet Take 1 tablet (12.5 mg total) by mouth 2 (two) times daily with a meal.   diphenhydrAMINE (BENADRYL) 25 mg capsule Take 25 mg by mouth every evening.   DULoxetine (CYMBALTA) 30 MG capsule Take 30 mg by mouth 2 (two) times daily.   Evolocumab (REPATHA SURECLICK) 140 MG/ML SOAJ INJECT 140 MG INTO THE SKIN EVERY 14 (FOURTEEN) DAYS.   gabapentin (NEURONTIN) 300 MG capsule Take 300 mg by mouth 2 (two) times daily.   gabapentin (NEURONTIN) 600 MG tablet TAKE  1 TABLET BY MOUTH AT BEDTIME.   HYDROmorphone (DILAUDID) 2 MG tablet Take 1 tablet (2 mg total) by mouth every 6 (six) hours as needed for moderate pain.   loratadine (CLARITIN) 10 MG tablet Take 10 mg by mouth daily.   methocarbamol (ROBAXIN) 500 MG tablet Take 1-2 tablets (500-1,000 mg total) by mouth every 6 (six) hours as needed for muscle spasms.   montelukast (SINGULAIR) 10 MG tablet Take 10 mg by mouth in the morning.   olmesartan (BENICAR) 40 MG tablet Take 40 mg by mouth daily. 1/2 pill   [DISCONTINUED] lidocaine (LIDODERM) 5 % Place 1 patch onto the skin daily as needed. Remove & Discard patch within 12 hours or as directed by MD   [DISCONTINUED] ondansetron (ZOFRAN) 4 MG tablet Take 1 tablet (4 mg total) by mouth every 6 (six) hours as needed for nausea or vomiting.   [DISCONTINUED] senna-docusate (SENOKOT-S) 8.6-50 MG tablet Take 1 tablet by mouth 2 (two) times daily.   No facility-administered encounter medications on file as of 09/22/2023.    Allergies as of 09/22/2023 - Review Complete 09/22/2023  Allergen Reaction Noted   Morphine and codeine Nausea And Vomiting 07/23/2015   Crestor [rosuvastatin] Other (See Comments) 11/20/2013   Vytorin [ezetimibe-simvastatin] Other (See Comments) 11/20/2013    Past Medical History:  Diagnosis Date   Coronary artery disease    COVID-19    In december 2020   Dyslipidemia  Dysrhythmia    a flutter   GERD (gastroesophageal reflux disease)    H/O cardiovascular stress test 10/19/2012   MET test - good effort, peak VO2 of almost 112% predicted, HR 79% predicted, mildly ischemia at end of exercise   History of hyperthyroidism as a child    History of nuclear stress test 03/2011   bruce myoview; mild perfusion defect in mid anteroseptal, apical septal, apical regions (infarct/scar/breast attenuation); post-stress EF 55%; no signficant ischemia   Ischemic cardiomyopathy    history of    Myocardial infarction (HCC)    Pneumonia    as a  kid   PONV (postoperative nausea and vomiting)    Primary localized osteoarthritis of right hip 05/24/2019   Primary localized osteoarthritis of right knee    Restless leg    right total hip arthroplasty with dislocation of hip (HCC) 08/14/2019   S/P CABG (coronary artery bypass graft) 2000   Staphylococcal arthritis of right knee (HCC) 2004   Wears glasses     Past Surgical History:  Procedure Laterality Date   A-FLUTTER ABLATION N/A 10/01/2018   Procedure: A-FLUTTER ABLATION;  Surgeon: Marinus Maw, MD;  Location: [facility];  Service: Cardiovascular;  Laterality: N/A;   A-FLUTTER ABLATION N/A 11/02/2018   Procedure: A-FLUTTER ABLATION;  Surgeon: Marinus Maw, MD;  Location: [facility];  Service: Cardiovascular;  Laterality: N/A;   CARDIAC CATHETERIZATION  06/01/2007   no obstructive CAD, patent grafts, EF 40%, apical hypokinesis, anterolateral hypokinesis (Dr. Laurell Josephs)    CERVICAL SPINE SURGERY     CHOLECYSTECTOMY     COLONOSCOPY     CORONARY ARTERY BYPASS GRAFT  06/05/1999   LIMA to LAD, SVG to diagonal 1, SVG to PDA of Cfx, right radial graft to OM1 (Dr. Molinda Bailiff)    ESOPHAGOGASTRODUODENOSCOPY     EYE SURGERY     KNEE SURGERY Right    pt. states x 6    TOTAL HIP ARTHROPLASTY Right 05/24/2019   Procedure: TOTAL HIP ARTHROPLASTY;  Surgeon: Teryl Lucy, MD;  Location: [facility];  Service: Orthopedics;  Laterality: Right;   TOTAL HIP REVISION Right 03/15/2020   Procedure: TOTAL HIP REVISION, posterior approach;  Surgeon: Durene Romans, MD;  Location: [facility];  Service: Orthopedics;  Laterality: Right;  90 mins posterior approach   TOTAL KNEE ARTHROPLASTY Right 07/23/2015   Procedure: RIGHT TOTAL KNEE ARTHROPLASTY;  Surgeon: Salvatore Marvel, MD;  Location: [facility] OR;  Service: Orthopedics;  Laterality: Right;   TRANSFORAMINAL LUMBAR INTERBODY FUSION (TLIF) WITH PEDICLE SCREW FIXATION 1 LEVEL Right 04/23/2023   Procedure: LUMBAR ONE-TWO TRANSFORAMINAL LUMBAR INTERBODY FUSION  (TLIF);  Surgeon: Estill Bamberg, MD;  Location: [facility] OR;  Service: Orthopedics;  Laterality: Right;   TRANSFORAMINAL LUMBAR INTERBODY FUSION (TLIF) WITH PEDICLE SCREW FIXATION 2 LEVEL Left 11/27/2021   Procedure: LEFT-SIDED LUMBAR 3- LUMBAR 4, LUMBAR 4 - LUMBAR 5 TRANSFORAMINAL LUMBAR INTERBODY FUSION WITH INSTRUMENTATION AND ALLOGRAFT;  Surgeon: Estill Bamberg, MD;  Location: [facility] OR;  Service: Orthopedics;  Laterality: Left;   TRANSTHORACIC ECHOCARDIOGRAM  02/2008   EF 40%, severe apical wall hypokinesis, mod-severe anterior wall hypokinesis; RV mildly dilated; mild mitral annular calcif; mild TR, RSVP 30-72mmHg    Family History  Problem Relation Age of Onset   CAD Father        s/p CABGx4 at 69   Hyperlipidemia Father     Social History   Socioeconomic History   Marital status: Married    Spouse name: Not  on file   Number of children: 2   Years of education: 12   Highest education level: Not on file  Occupational History    Employer: [employer]  Tobacco Use   Smoking status: Some Days    Types: Cigars   Smokeless tobacco: Never   Tobacco comments:    cigar occassionally  Vaping Use   Vaping status: Never Used  Substance and Sexual Activity   Alcohol use: Yes    Alcohol/week: 0.0 standard drinks of alcohol    Comment: minimal   Drug use: No   Sexual activity: Not Currently  Other Topics Concern   Not on file  Social History Narrative   Not on file   Social Determinants of Health   Financial Resource Strain: Not on file  Food Insecurity: No Food Insecurity (04/21/2023)   Hunger Vital Sign    Worried About Running Out of Food in the Last Year: Never true    Ran Out of Food in the Last Year: Never true  Transportation Needs: No Transportation Needs (04/21/2023)   PRAPARE - Administrator, Civil Service (Medical): No    Lack of Transportation (Non-Medical): No  Physical Activity: Not on file  Stress: Not on file  Social Connections: Not on file   Intimate Partner Violence: Not At Risk (04/21/2023)   Humiliation, Afraid, Rape, and Kick questionnaire    Fear of Current or Ex-Partner: No    Emotionally Abused: No    Physically Abused: No    Sexually Abused: No    Review of Systems  Constitutional:  Negative for fatigue.  Respiratory:  Negative for shortness of breath.   Psychiatric/Behavioral:  Positive for sleep disturbance.     Vitals:   09/22/23 0909  BP: 90/70  Pulse: 71  SpO2: 95%     Physical Exam Constitutional:      Appearance: He is obese.  HENT:     Head: Normocephalic.     Nose: No congestion.     Mouth/Throat:     Mouth: Mucous membranes are moist.  Eyes:     Pupils: Pupils are equal, round, and reactive to light.  Cardiovascular:     Rate and Rhythm: Normal rate and regular rhythm.     Heart sounds: No murmur heard.    No friction rub.  Pulmonary:     Effort: No respiratory distress.     Breath sounds: No stridor. No wheezing.  Musculoskeletal:     Cervical back: No rigidity or tenderness.  Neurological:     Mental Status: He is alert.  Psychiatric:        Mood and Affect: Mood normal.      Data Reviewed: Most recent echocardiogram 02/24/2020 with normal ejection fraction, normal right ventricular systolic function  Assessment:  Restless leg syndrome -Symptoms feel little bit better according to the patient with gabapentin  Associated sleep maintenance insomnia -Still with significant insomnia and multiple awakenings  Chronic back pain -Surgical options being explored  Snoring -Further evaluation will involve a sleep study once we are able to get in better quality sleep after controlling his restless legs a little bit better  Plan/Recommendations: Switched to gabapentin encarbril 600mg   Will consider home sleep study once sleep quality improved   follow-up in about 2 months   Encouraged to call with any significant concerns  Did look up side effect profile of gabapentin, no  significant interaction with loratadine  I spent 30 minutes dedicated to the care of this  patient on the date of this encounter to include previsit review of records, face-to-face time with the patient discussing conditions above, post visit ordering of testing, clinical documentation with electronic health record and communicated necessary findings to members of the patient's care team   Virl Diamond MD [facility] 09/22/2023, 9:20 AM  CC: Barbie Banner, MD

## 2023-09-23 ENCOUNTER — Telehealth: Payer: Self-pay | Admitting: Pulmonary Disease

## 2023-09-23 NOTE — Telephone Encounter (Signed)
PCCM cross cover note  Received telephone call from wife stating that patient developed vomiting, cold sweats after taking 1 dose of buprenorphine Advised him to discontinue the medication and monitor symptoms.  Stay well-hydrated If he does not feel better towards the end of the day then he may need to seek emergency room or urgent care evaluation  Chilton Greathouse MD [facility] 09/23/2023, 8:04 AM

## 2023-09-23 NOTE — Telephone Encounter (Signed)
Checked on patient  Not tolerating buprenorphine Woke up in a cold sweat, vomiting episode, nausea  Discontinue buprenorphine  Monitor symptoms  May take Phenergan or Benadryl  Symptoms should be short-lived  Other options of treatment are limited, Sinemet would be an option down the line  Encouraged to call with any ongoing concerning symptoms

## 2023-11-25 ENCOUNTER — Ambulatory Visit: Payer: PPO | Admitting: Pulmonary Disease

## 2024-01-14 ENCOUNTER — Other Ambulatory Visit (HOSPITAL_COMMUNITY): Payer: Self-pay

## 2024-01-14 ENCOUNTER — Telehealth: Payer: Self-pay | Admitting: Pharmacy Technician

## 2024-01-14 NOTE — Telephone Encounter (Signed)
 Pharmacy Patient Advocate Encounter   Received notification from Fax that prior authorization for Repatha  SureClick 140MG /ML auto-injectors is required/requested.   Insurance verification completed.   The patient is insured through [facility] [employer]/RX [employer] .   Per test claim: PA required; PA submitted to above mentioned insurance via CoverMyMeds Key/confirmation #/EOC [id number] Status is pending

## 2024-01-15 NOTE — Telephone Encounter (Signed)
 Plan asked for labs before repatha . Faxed.

## 2024-01-15 NOTE — Telephone Encounter (Signed)
 Patient identification verified by 2 forms. Shirl Ellen, RN     Called and spoke to patient and informed him that his PA for repatha  was approved amount of copay.  Patient stated he was paying $100 for 90days. Patient said he would call his insurance to discuss the new copay amount.   Patient agrees with plan, no questions at this time

## 2024-01-15 NOTE — Telephone Encounter (Signed)
 Pharmacy Patient Advocate Encounter  Received notification from [employer] that Prior Authorization for Repatha  has been APPROVED from 01/15/24 to 01/14/25. Spoke to pharmacy to process.Copay is $117.50 for 30 days.    PA #/Case ID/Reference #: [id number]

## 2024-07-02 ENCOUNTER — Other Ambulatory Visit: Payer: Self-pay | Admitting: Internal Medicine
